# Patient Record
Sex: Male | Born: 1935 | Race: White | Hispanic: No | Marital: Married | Smoking: Never smoker
Health system: Southern US, Community
[De-identification: ages and names within clinical notes are randomized; demographics above are authoritative.]

## PROBLEM LIST (undated history)

## (undated) DIAGNOSIS — R519 Headache, unspecified: Secondary | ICD-10-CM

## (undated) DIAGNOSIS — I2510 Atherosclerotic heart disease of native coronary artery without angina pectoris: Secondary | ICD-10-CM

## (undated) DIAGNOSIS — M1990 Unspecified osteoarthritis, unspecified site: Secondary | ICD-10-CM

## (undated) DIAGNOSIS — R51 Headache: Secondary | ICD-10-CM

## (undated) DIAGNOSIS — N400 Enlarged prostate without lower urinary tract symptoms: Secondary | ICD-10-CM

## (undated) DIAGNOSIS — I10 Essential (primary) hypertension: Secondary | ICD-10-CM

## (undated) DIAGNOSIS — E781 Pure hyperglyceridemia: Secondary | ICD-10-CM

## (undated) DIAGNOSIS — K219 Gastro-esophageal reflux disease without esophagitis: Secondary | ICD-10-CM

## (undated) DIAGNOSIS — J309 Allergic rhinitis, unspecified: Secondary | ICD-10-CM

## (undated) DIAGNOSIS — M549 Dorsalgia, unspecified: Secondary | ICD-10-CM

## (undated) DIAGNOSIS — R4189 Other symptoms and signs involving cognitive functions and awareness: Secondary | ICD-10-CM

## (undated) DIAGNOSIS — I214 Non-ST elevation (NSTEMI) myocardial infarction: Secondary | ICD-10-CM

## (undated) DIAGNOSIS — R42 Dizziness and giddiness: Secondary | ICD-10-CM

## (undated) HISTORY — DX: Non-ST elevation (NSTEMI) myocardial infarction: I21.4

## (undated) HISTORY — DX: Atherosclerotic heart disease of native coronary artery without angina pectoris: I25.10

## (undated) HISTORY — DX: Unspecified osteoarthritis, unspecified site: M19.90

## (undated) HISTORY — DX: Benign prostatic hyperplasia without lower urinary tract symptoms: N40.0

## (undated) HISTORY — DX: Essential (primary) hypertension: I10

## (undated) HISTORY — PX: CARDIAC CATHETERIZATION: SHX172

## (undated) HISTORY — DX: Allergic rhinitis, unspecified: J30.9

## (undated) HISTORY — DX: Pure hyperglyceridemia: E78.1

## (undated) HISTORY — DX: Gastro-esophageal reflux disease without esophagitis: K21.9

---

## 1997-12-10 ENCOUNTER — Ambulatory Visit (HOSPITAL_COMMUNITY): Admission: RE | Admit: 1997-12-10 | Discharge: 1997-12-10 | Payer: Self-pay | Admitting: Gastroenterology

## 2000-09-10 ENCOUNTER — Encounter: Admission: RE | Admit: 2000-09-10 | Discharge: 2000-09-10 | Payer: Self-pay | Admitting: Family Medicine

## 2000-09-10 ENCOUNTER — Encounter: Payer: Self-pay | Admitting: Family Medicine

## 2002-02-19 ENCOUNTER — Ambulatory Visit (HOSPITAL_COMMUNITY): Admission: RE | Admit: 2002-02-19 | Discharge: 2002-02-19 | Payer: Self-pay | Admitting: Gastroenterology

## 2005-03-10 ENCOUNTER — Inpatient Hospital Stay (HOSPITAL_COMMUNITY): Admission: EM | Admit: 2005-03-10 | Discharge: 2005-03-13 | Payer: Self-pay | Admitting: Emergency Medicine

## 2005-03-29 ENCOUNTER — Encounter (HOSPITAL_COMMUNITY): Admission: RE | Admit: 2005-03-29 | Discharge: 2005-06-27 | Payer: Self-pay | Admitting: Cardiology

## 2006-10-21 ENCOUNTER — Encounter: Admission: RE | Admit: 2006-10-21 | Discharge: 2006-10-21 | Payer: Self-pay | Admitting: Sports Medicine

## 2006-10-22 ENCOUNTER — Emergency Department (HOSPITAL_COMMUNITY): Admission: EM | Admit: 2006-10-22 | Discharge: 2006-10-22 | Payer: Self-pay | Admitting: Emergency Medicine

## 2006-10-31 ENCOUNTER — Encounter: Admission: RE | Admit: 2006-10-31 | Discharge: 2006-10-31 | Payer: Self-pay | Admitting: Orthopaedic Surgery

## 2006-11-05 ENCOUNTER — Encounter: Admission: RE | Admit: 2006-11-05 | Discharge: 2006-11-05 | Payer: Self-pay | Admitting: Orthopaedic Surgery

## 2006-11-09 ENCOUNTER — Inpatient Hospital Stay (HOSPITAL_COMMUNITY): Admission: EM | Admit: 2006-11-09 | Discharge: 2006-11-21 | Payer: Self-pay | Admitting: Emergency Medicine

## 2006-11-09 ENCOUNTER — Ambulatory Visit: Payer: Self-pay | Admitting: Internal Medicine

## 2010-07-18 NOTE — Discharge Summary (Signed)
Shaun Moore, Shaun Moore                    ACCOUNT NO.:  [id number]   MEDICAL RECORD NO.:  [id number]          PATIENT TYPE:  INP   LOCATION:  1530                         FACILITY:  [facility]   PHYSICIAN:  Michelene Gardener, MD    DATE OF BIRTH:  May 20, 1925   DATE OF ADMISSION:  11/09/2006  DATE OF DISCHARGE:  11/21/2006                               DISCHARGE SUMMARY   PRIMARY CARE PHYSICIAN:  Dr. Donia Guiles.   DISCHARGE DIAGNOSES:  1. Intractable back pain status post vertebroplasty.  2. Sepsis secondary to pneumonia.  3. Aspiration pneumonia.  4. Coronary artery disease.  5. Renal insufficiency.  6. Hypertension.  7. Hyperlipidemia.   DISCHARGE MEDICATIONS:  1. Benicar 20 mg p.o. once daily.  2. Dulcolax 5 mg twice daily as needed.  3. Flomax 0.4 mg once daily.  4. Multivitamin one tablet p.o. once daily.  5. Niaspan 500 mg at bedtime.  6. Plavix 75 mg p.o. once daily.  7. Vytorin 10/10 once daily.  8. Norvasc 10 mg p.o. once daily.  9. Clonidine patch 0.2 mg every week.   CONSULTATIONS:  Interventional Radiology.   PROCEDURES PERFORMED:  Vertebroplasty.   FOLLOWUP APPOINTMENTS:  Primary physician, Dr. Donia Guiles in 1-2  weeks.   RADIOLOGY STUDIES:  1. Chest x-ray on November 11, 2006, showed prominence in the right      hilum with left base atelectasis and moderate gastric distention.  2. Renal ultrasound on September 8 showed normal-sized kidneys with a      small left renal cyst.  3. Abdominal x-ray on September 8 showed nasogastric tube.  4. Repeat chest x-ray done on September 8 showed a PICC line in place.  5. Chest X-Ray on September 9 showed some improvement in the bibasilar      atelectasis.  6. Abdominal x-ray on September 10 showed unremarkable findings.  7. X-ray on September 11 showed continued improvement in the      densities.  8. Vertebroplasty done on September 15.   HOSPITAL COURSE:  1. Intractable back pain.  This patient was admitted to the  hospital      for further evaluation.  The patient was started on pain treatment      and pain medications.  During the hospitalization, Interventional      Radiology was consulted and on September 15 the patient had      vertebroplasty.  Currently, the patient doing very fine.  Physical      therapy started and he tolerated very well.  Will give him Vicodin      to take as needed.  Recommended to follow with his primary      physician after discharge.  2. Sepsis.  The patient developed a fever and was started on      antibiotics during the hospitalization.  He completed a course of      antibiotics in the hospital.  Currently, he is stable.  White count      is normal.  He is afebrile.  No need for more antibiotics.  3. Aspiration pneumonia.  The patient thought to have aspiration in      the beginning.  He was monitored in ICU for some time.  The patient      was started on IV antibiotics during the hospitalization.  NG tube      was placed for aspiration precautions.  A swallow study was done      and the patient did fine.  He completed more than 7 days of IV      antibiotics.  No need for further antibiotics at this time.  The      last chest x-ray showed very minimal changes in the x-ray.  4. Hypertension.  His blood pressure remains very uncontrolled.  He      had clonidine patch added to his medications in addition to      Norvasc.  Then his  Benicar was continued.  With those      measurements, his systolic blood pressure remained between 130-140.      He was advised to follow with his primary physician as soon as      possible because of the multiple antihypertensive medications that      he is taking now.  5. Coronary artery disease.  That remained stable during his      hospitalization.   DISPOSITION:  Otherwise, other medical conditions remained stable and  the patient was continued on same other medications taken at home.   Total assessment time is 40 minutes.       Michelene Gardener, MD  Electronically Signed     NAE/MEDQ  D:  11/21/2006  T:  11/21/2006  Job:  [id number]   cc:   Donia Guiles, M.D.  Fax: [phone]

## 2010-07-18 NOTE — H&P (Signed)
Shaun Moore, Shaun Moore                    ACCOUNT NO.:  [id number]   MEDICAL RECORD NO.:  [id number]          PATIENT TYPE:  INP   LOCATION:  0104                         FACILITY:  [facility]   PHYSICIAN:  Shaun Mole. Swords, MD    DATE OF BIRTH:  04-07-1925   DATE OF ADMISSION:  11/09/2006  DATE OF DISCHARGE:                              HISTORY & PHYSICAL   CHIEF COMPLAINT:  Back pain.   HISTORY OF PRESENT ILLNESS:  Shaun Moore is a 77 year old gentleman who has  had 5-6 weeks of progressive back discomfort.  This has been thoroughly  evaluated as an outpatient by his medical physician, spine specialist  and interventional radiculopathy.  Etiology of the discomfort is thought  to be related to an L3 endplate fracture or Schmorl's node.  The patient  describes constant lower back pain, associated with intermittent sharp,  severe (10 out of 10) brief episodes of discomfort. This discomfort  seems to radiate to the right hip.  The pain is progressively worsened.  He denies any weakness, fevers, chills, sensory loss.  He has not had  any bowel of bladder trouble.  He does report that 2 months prior to the  beginning of his discomfort he fell on his back; but he had no pain at  that time.  He has had a recent MRI, whole body scan and abdominal CT.  Those studies are reviewed.  No significant abdominal process on the CT  scan.  MRI demonstrated a subacute Schmorl's node in the superior  endplate of L3, with surrounding marrow edema.  Nuclear medicine whole  body scan demonstrated abnormal increased activity within the L3  vertebral body, compatible with a subacute Schmorl's node or focal  endplate fracture.   PAST MEDICAL HISTORY:  1. Myocardial infarction.  2. Coronary artery disease.  3. Dyslipidemia.  4. Hypertension.   PAST SURGICAL HISTORY:  He has had 2 drug eluting stents.   MEDICATIONS:  Benicar, Vytorin, Plavix, Niaspan, Vicodin, Flomax,  Dulcolax.  Dosages of these medications are not  known.   ALLERGIES:  NO KNOWN DRUG ALLERGIES.   SOCIAL HISTORY:  Lives with his wife.  He is a nonsmoker.  Does not  drink alcohol.   FAMILY HISTORY:  Significant for coronary artery disease in his father.   REVIEW OF SYSTEMS:  Completely unremarkable, except for that within the  HPI.   PHYSICAL EXAMINATION:  Temperature 97.4, blood pressure 124/67, pulse  72, respirations 20.  GENERAL:  Appears as a well developed, well nourished male in no acute  distress.  He appears younger than his stated age.  NECK:  Supple without lymphadenopathy, thyromegaly, jugular venous  distention or carotid bruits.  HEENT:  Pupils are round.  CHEST:  Clear to auscultation.  CARDIAC:  S1, S2 are regular without murmurs or gallops.  ABDOMEN:  Active bowel sounds; soft and nontender.  There is no  hepatosplenomegaly.  MUSCULOSKELETAL:  There is no palpable discomfort over the hip or low  back.  He has full internal and external rotation of both hips.  Straight leg raise  is positive bilaterally at 30 degrees.  NEUROLOGIC:  Deep tendon reflexes at the knee are normal; at the ankle  are absent.  Toes are downgoing bilaterally with Babinski maneuver.  EXTREMITIES:  There is no clubbing, cyanosis or edema.   LABORATORY STUDIES:  Urinalysis is normal.  CMET:  Glucose 174, BUN 47,  creatinine 1.94, total bilirubin 1.3.  CBC:  White count 11.3,  hemoglobin 11.7.   IMAGING STUDIES:  Nuclear Medicine Scan:  As above.  Diagnostic Abdomen  and Chest from October 22, 2006 was reviewed; there was no significant  abnormality on that chest x-ray and abdominal film.  MRI scan reviewed  from October 21, 2006.   ASSESSMENT AND PLAN:  1. INTRACTABLE BACK PAIN.  Likely secondary to endplate fracture at      L3.  The patient has undergone steroid injection at the right L3      nerve root.  He has really had no significant relief from the      discomfort.  I suspect this has been unsuccessful attempt.  He will      need  further intervention.  There is mention on the procedure      report that consideration may be given to a vertebroplasty.  I      think that is the direction we should pursue.  I have talked with      radiology; they have asked me to write an order in the hospital      chart for a vertebroplasty.  They will consult.  I doubt that his      discomfort or imaging abnormalities are related to infectious or      malignancy.  I will obtain blood cultures and a sed rate.  It is      worth noting that his white count is minimally elevated and he is      mildly anemic.  2. CORONARY ARTERY DISEASE.  Stable.  3. HYPERGLYCEMIA.  Will monitor tomorrow with a BMET.  Will also check      a hemoglobin A1c.  4. RENAL INSUFFICIENCY.  It is unclear what his baseline renal status      is.  We will check a BMET tomorrow.  Will hydrate tonight.      Shaun Rexene Edison Swords, MD  Electronically Signed     BHS/MEDQ  D:  11/09/2006  T:  11/09/2006  Job:  [id number]   cc:   Shaun Moore, M.D.  Fax: [phone]   Shaun Moore, M.D.  Fax: [phone]

## 2010-07-21 NOTE — Op Note (Signed)
   NAME:  Shaun Moore, Shaun Moore                              ACCOUNT NO.:  [id number]   MEDICAL RECORD NO.:  [id number]                   PATIENT TYPE:  AMB   LOCATION:  ENDO                                 FACILITY:  [facility]   PHYSICIAN:  James L. Malon Kindle., M.D.          DATE OF BIRTH:  1925/09/29   DATE OF PROCEDURE:  02/19/2002  DATE OF DISCHARGE:                                 OPERATIVE REPORT   PROCEDURE:  Colonoscopy.   MEDICATIONS:  Fentanyl 100 mcg, Versed 10 mg IV.   INDICATIONS:  Heme positive stool.   DESCRIPTION OF PROCEDURE:  The procedure had been explained to the patient  and consent was obtained.  With the patient in the left lateral decubitus  position.  The scope was inserted and advanced under direct visualization.  The prep was quite good.  We were able to advance finally to the cecum.  We  started with the adult scope and because of patient's sensitive severe  diverticular disease and we were unable to pass the sigmoid colon with the  adult scope and switched over to the adjustable pediatric and eventually  were able to pass the sigmoid with that.  After passing the sigmoid, we were  able to advance rapidly to the cecum.  The ileocecal valve, and appendiceal  orifice seen.  The scope was withdrawn to cecum, ascending colon, hepatic  flexure, transverse colon, splenic flexure, descending and sigmoid colon  were seen well and no polyps seen.  Again, extensive diverticular disease of  the left colon.  Internal hemorrhoids seen in the rectum.  The scope was  withdrawn and the patient tolerated the procedure well.   ASSESSMENT:  1. Marked diverticular disease.  2. Heme positive stools.   PLAN:  Will see back in the office in two months.  Give an information sheet  about diverticular disease.                                               James L. Malon Kindle., M.D.    [name]  D:  02/19/2002  T:  02/20/2002  Job:  [id number]   cc:   Donia Guiles, M.D.  [address] [place]  [place] [address]  Fax: [phone]

## 2010-07-21 NOTE — H&P (Signed)
NAMEJOSHVA, LABRECK                    ACCOUNT NO.:  [id number]   MEDICAL RECORD NO.:  [id number]          PATIENT TYPE:  OBV   LOCATION:  2010                         FACILITY:  [facility]   PHYSICIAN:  Ulyses Amor, MD DATE OF BIRTH:  [birth year]   DATE OF ADMISSION:  03/08/2005  DATE OF DISCHARGE:                                HISTORY & PHYSICAL   IDENTIFYING DATA AND CHIEF COMPLAINT:  Shaun Moore is a 77 year old white man  who is admitted to [facility] for what is suspected to be unstable  angina, although the possibility of an acute non-ST segment elevation  myocardial infarction cannot be excluded.   HISTORY OF ILLNESS:  The patient, who has no past history of cardiac disease  presents to the emergency department with a two-day history of exertional  bilateral forearm pain and ultimately this evening pain at rest.  During the  last few days the patient has noticed while waking bilateral forearm pain.  This has been associated with a discomfort in his throat.  The discomfort  would begin with activity and has resolved with rest.  He has experienced  this perhaps four to five times over these last few days; each episode has  lasted 30 minutes.   Today, however, he experienced an episode of bilateral forearm pain at rest.  It is more intense than usual and it is associated with profuse diaphoresis.  It eventually resolved after approximately 30 minutes, but he felt weak for  a period of time thereafter.  He is symptomatic and otherwise feels well at  this time.  There are no other exacerbating or ameliorating factors.  There  is no other radiation of this discomfort.  The quality of the bilateral  forearm discomfort is described as an ache.  There is no chest pain,  tightness, heaviness, pressure, or squeezing.  Nor is there any dyspnea or  nausea.   As noted the patient has past no history of cardiac disease including no  history of chest pain, myocardial infarction,  coronary artery disease,  congestive heart failure, or arrhythmias.  He has a number of risk factors  for coronary disease including hypertension dyslipidemia, and a family of  early coronary artery disease in his father in his 88s.  There is no history  of diabetes mellitus or smoking.   PAST MEDICAL HISTORY:  The patient has no other medical problems.   LIFESTYLE:  The patient lives a vigorous lifestyle.  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   MEDICATIONS:  Benicar and Vytorin   ALLERGIES:  The patient has no allergies.   PAST SURGICAL HISTORY:  Operations - none.   ACCIDENTS AND INJURIES:  There is no history of significant injures.   SOCIAL HISTORY:  The patient lives with his wife.  Her works as described  above.  He does no smoke cigarettes or drink alcohol.   FAMILY HISTORY:  Coronary artery disease as noted above in is father.  His  mother died healthy  at an old age.   REVIEW OF SYSTEMS:  Review of systems reveals no new problems related to his  head, eyes, ears, nose, mouth, throat, lungs, gastrointestinal system,  genitourinary system, or extremities.  There is no history of neurologic or  psychiatric disorder.  There is no history of fever, chills or weight loss.   PHYSICAL EXAMINATION:  VITAL SIGNS:  Blood pressure 118/69, pulse 76 and  regular, respirations 18 and temperature 97.8.  GENERAL APPEARANCE:  The patient is an elderly white man in no discomfort.  He is alert, oriented, appropriate and responsive.  HEENT:  Head, eyes, nose and mouth are normal.  NECK:  The neck is without thyromegaly or adenopathy.  Carotid pulses are  palpable bilaterally and without bruits.  HEART:  Cardiac examination reveals a normal S1 and S2.  There is no S3, S4,  murmur, rub, or click.  Cardiac rhythm is regular.  CHEST:  No chest wall tenderness is noted.  LUNGS:  The lungs are clear.  ABDOMEN:  The abdomen is soft and  nontender.  There is no mass,  hepatosplenomegaly, bruit, distention, rebound, guarding, or rigidity.  Bowel sounds are normal.  RECTAL AND GENITALIA:  Rectal and genital examinations are not performed as  they are not pertinent to the reason for acute care hospitalization.  EXTREMITIES:  The extremities are without edema, deviation or deformity.  VASCULAR:  Radial and dorsalis pedal pulses are palpable bilaterally.  NEUROLOGIC EXAMINATION:  Brief screening neurologic survey is unremarkable.   LABORATORY DATA:  The electrocardiogram revealed normal sinus rhythm with  right bundle branch block.  The possibility of a prior inferior myocardial  infarction cannot be excluded.  There were no changes, specifically ischemia  or infarction.  The chest radiograph demonstrated no evidence of acute  disease.  The initial set of cardiac markers revealed a myoglobin of 176,  CK/MB 4.2 and troponin 0.11.  A second set of cardiac markers revealed  myoglobin of 118, CK/MB 3.2 and troponin 0.08.  Potassium 4.4, BUN 23 and  creatinine 1.3.  White count is 7.5 with a hemoglobin of 14,1 and a  hematocrit of 40.3.  The remaining studies are pending at the time of this  dictation.   IMPRESSION:  1.  Unstable angina, rule out non-ST segment elevation acute myocardial      infarction.  2.  Hypertension.  3.  Dyslipidemia.   PLAN:  1.  Telemetry.  2 .  Serial cardiac enzymes.  1.  Aspirin or intravenous nitroglycerin.  2.  Intravenous heparin.  3.  Metoprolol.  4.  Fasting lipid profile.  5.  Further measures per Dr. Amil Amen.      Ulyses Amor, MD  Electronically Signed     MSC/MEDQ  D:  03/09/2005  T:  03/09/2005  Job:  [id number]

## 2010-07-21 NOTE — Discharge Summary (Signed)
Shaun Moore, Shaun Moore                    ACCOUNT NO.:  [id number]   MEDICAL RECORD NO.:  [id number]          PATIENT TYPE:  INP   LOCATION:  6524                         FACILITY:  [facility]   PHYSICIAN:  Francisca December, M.D.  DATE OF BIRTH:  05/24/1925   DATE OF ADMISSION:  03/08/2005  DATE OF DISCHARGE:  03/13/2005                                 DISCHARGE SUMMARY   ADMISSION DIAGNOSES:  1.  Acute coronary syndrome/non-ST-segment-elevation myocardial infarction.  2.  Hypertension.  3.  Dyslipidemia.   DISCHARGE DIAGNOSES:  1.  Atherosclerotic cardiovascular disease, two-vessel.  2.  Non-ST-segment-elevation myocardial infarction, inferior.  3.  Hypertension.  4.  Dyslipidemia.   HISTORY AND PHYSICAL:  Please see the summary by Dr. Lemmie Evens dated  March 10, 2005.   ADMISSION LABORATORY WORK:  Initial CBC was normal, hemoglobin 14.1;  platelets 310,000; white blood cell count 7500.  Serum electrolytes were  normal, BUN was 1.3, creatinine 1.5, blood sugar minimally elevated at 1.0.  Liver associated enzymes were normal with the exception of a total bilirubin  of 1.4.  Initial CK-MB was negative, troponin was 0.16 with a followup of  0.15.  His admission electrocardiogram was nonacute.  His total cholesterol  was 153 with a triglyceride of 183, an HDL of 32 and an LDL of 84.   MEDICATIONS ON ADMISSION:  Included Benicar and Vytorin.   COURSE IN THE HOSPITAL:  The patient was admitted by Dr. Waldon Reining, received  serial cardiac enzymes, IV nitroglycerin, IV heparin, and metoprolol, as  well as aspirin.  I saw him on March 09, 2005, and recommended cardiac  catheterization.  This was carried out and there was a finding of an  eccentric 80-90% anterior descending artery stenosis, as well as a 95%  proximal RCA stenosis.  The right coronary artery was treated with a drug-  eluting stent with complete resolution of the obstruction.  Integrilin was  continued following the  catheterization.  He was seen by Dr. Mayford Knife on  March 10, 2005, found to be doing quite well.  He was observed over the  weekend, had Plavix initiated, and then was returned to the catheterization  laboratory on March 12, 2005, where his 90% mid anterior descending artery  was treated again with a drug-eluting stent.  This was 3.0/20 mm Cypher  intercoronary stent.  Again, the 90% obstruction was reduced to 0%  obstruction.  He had no complications following these procedures, the right  groin healed nicely.  He was discharged on Vytorin, aspirin, Plavix, and  continued on his Benicar.  Overall LV systolic function was noted to be  normal at the time of his initial catheterization on March 09, 2005.   DISCHARGE MEDICATIONS:  1.  Vytorin 10/40 one p.o. daily.  2.  Aspirin 325 mg p.o. daily.  3.  Plavix 75 mg p.o. daily.  4.  Benicar HCT 40/12.5 one-half p.o. daily.  5.  P.r.n. nitroglycerin.   FOLLOWUP:  The patient has been given his activity prescription per cardiac  rehab and I will see him in the  office in 10-14 days.  He should promptly  call or return to the emergency room for any recurrent chest discomfort.      Francisca December, M.D.  Electronically Signed     JHE/MEDQ  D:  07/14/2005  T:  07/16/2005  Job:  [id number]

## 2010-07-21 NOTE — Cardiovascular Report (Signed)
Shaun Moore, Shaun Moore                    ACCOUNT NO.:  [id number]   MEDICAL RECORD NO.:  [id number]          PATIENT TYPE:  OBV   LOCATION:  2010                         FACILITY:  [facility]   PHYSICIAN:  Francisca December, M.D.  DATE OF BIRTH:  Sep 06, 1925   DATE OF PROCEDURE:  03/09/2005  DATE OF DISCHARGE:                              CARDIAC CATHETERIZATION   PROCEDURES PERFORMED:  1.  Left heart catheterization.  2.  Left ventriculogram.  3.  Coronary angiography.  4.  PCI/drug-eluting stent implantation of proximal right coronary artery.   INDICATIONS:  Shaun Moore is a 77 year old otherwise completely healthy  gentleman who has developed bilateral  proximal arm pain initially with  modest exertion. Yesterday, it occurred at rest and was associated with some  with some mild anterior substernal pain and diaphoresis. He presented to  emergency room where troponins have been minimally elevated. No significant  ECG changes are found. He is brought to catheterization laboratory at this  time to identify the extent of disease and provide further therapeutic  options.   PROCEDURAL NOTE:  The patient is brought to cardiac catheterization  laboratory in fasting state. The right groin was prepped and draped in the  usual sterile fashion. Local anesthesia was obtained with infiltration of 1%  lidocaine. A 6-French catheter sheath was inserted percutaneously into the  right femoral artery utilizing an anterior approach over a guiding J-wire.  Left heart catheterization and coronary angiography then proceeded in a  standard fashion using 6-French #4 left and right Judkins catheters and a 6-  French 110-cm pigtail catheter. All catheter manipulations were performed  using fluoroscopic observation and exchanges performed over a long guiding J-  wire. A left ventriculogram was performed in the 30 degrees RAO angulation  utilizing a power injector. At the completion of procedure, I proceeded with  PCI  of the right coronary. He also noted to have a significant stenosis in  the mid anterior descending artery. The right coronary lesion appeared to be  more likely the culprit lesion, however. A 6-French Cordis 3-D RC guiding  catheter was advanced to the descending aorta where the right coronary os  was engaged. I was unsuccessful engaging the right coronary adequately with  a standard FR guiding catheter and also an AR1. The 0.014 inches Luge  intracoronary guidewire passed across the lesion without difficulty. The  patient had previously been treated with 41,000 units of heparin  intravenously as well as a double bolus and constant infusion of Integrilin.  A right femoral vein sheath was placed to facilitate venous access. The  resulting ACT was 302 seconds. The lesion was initially dilated using a  2.5/15 mm Scimed Maverick intracoronary balloon. This inflated to 6  atmospheres for approximately 1 minute. It was deflated removed. A 3.0 slash  20 mm Scimed Taxus intracoronary drug-eluting stent was then advanced into  place, carefully positioned and deployed at peak pressure of 15 atmospheres  for approximately 45 seconds. The stent balloon was deflated removed and a  3.25/12 mm Scimed Maverick intracoronary balloon was advanced in  place in  the more proximal portion of the stent inflated 18 atmospheres for 30  seconds. It was deflated and advanced slightly and reinflated again to 18  atmospheres for approximately 30 seconds. This balloon was then deflated  removed. Following confirmation of adequate patency in orthogonal views both  with and without the guidewire in place, the guiding catheter was removed.  Hemostasis was achieved by suturing the sheath in place. The patient is  transported to recovery area in stable condition.   HEMODYNAMICS:  Systemic arterial pressure was 104/60 with a mean of 79 mmHg.  There was no systolic gradient across the aortic valve. Left ventricular end-   diastolic pressure was 11 mmHg pre-ventriculogram.   Angiography:  The left ventriculogram demonstrated normal chamber size and  normal global systolic function. There were no regional wall motion  abnormalities. A visual estimate the ejection fraction is 65-70%. The aortic  valve is trileaflet and opens normally. There is no significant mitral  regurgitation. There is no significant coronary calcification.   There was a right dominant coronary artery system present. The main left  coronary artery was normal.   The left anterior descending artery was highly diseased; the vessel contains  an 80-90% diffuse stenosis in the midportion and flow in the anterior  descending is somewhat sluggish. There are two moderate-sized diagonal  lesions which arise prior to the significant stenosis in the midportion. The  ongoing anterior descending is large, transapical and without significant  obstruction.   The left circumflex artery amounts to a single large marginal branch which  trifurcates on the lateral wall of the heart from the mid to the distal  portion. There are some luminal irregularities in this vessel but no  significant obstructions.   The right coronary and its branches are highly diseased; the vessel contains  a proximal eccentric 95% stenosis which extends over about 15 mm. The more  distal portion of this is where the subtotal stenosis is located. The  ongoing right coronary shows luminal irregularities but again no significant  obstructions are seen. The distal portion bifurcates into a small posterior  descending artery and then a large posterolateral segment. The  posterolateral segment gives rise to three small left ventricular branches.   Collateral vessels are not seen.   Following balloon dilatation and stent implantation in the proximal right  coronary, there is no residual stenosis and there is a good step-up and step-down in the stented segment.   FINAL IMPRESSION:   1.  Atherosclerotic cardiovascular disease, two-vessel.  2.  Status post successful PCI and drug-eluting stent implantation, proximal      right coronary.  3.  Intact left ventricular size and global systolic function.   PLAN:  The patient will be returned to the catheterization laboratory in 2-3  days for treatment of the anterior descending artery.      Francisca December, M.D.  Electronically Signed     JHE/MEDQ  D:  03/09/2005  T:  03/09/2005  Job:  [id number]   cc:   Donia Guiles, M.D.  Fax: [phone]

## 2010-12-14 LAB — BASIC METABOLIC PANEL
BUN: 12
CO2: 29
Calcium: 8.9
Chloride: 107
Creatinine, Ser: 1.29
GFR calc Af Amer: >60
GFR calc non Af Amer: 54 — ABNORMAL LOW
Glucose, Bld: 95
Potassium: 4.1
Sodium: 142

## 2010-12-14 LAB — CBC
HCT: 29.4 — ABNORMAL LOW
Hemoglobin: 10.3 — ABNORMAL LOW
MCHC: 35
MCV: 93.7
Platelets: 215
RBC: 3.14 — ABNORMAL LOW
RDW: 13
WBC: 9.9

## 2010-12-14 LAB — PROTIME-INR
INR: 1.1
Prothrombin Time: 14.7

## 2010-12-14 LAB — APTT: aPTT: 29

## 2010-12-15 LAB — BASIC METABOLIC PANEL
BUN: 41 — ABNORMAL HIGH
CO2: 22
CO2: 24
CO2: 26
Calcium: 8.4
Calcium: 8.7
Chloride: 104
Chloride: 107
Chloride: 111
Creatinine, Ser: 1.62 — ABNORMAL HIGH
GFR calc Af Amer: 30 — ABNORMAL LOW
GFR calc Af Amer: 33 — ABNORMAL LOW
GFR calc Af Amer: 50 — ABNORMAL LOW
GFR calc Af Amer: 60 — ABNORMAL LOW
GFR calc non Af Amer: 24 — ABNORMAL LOW
GFR calc non Af Amer: 41 — ABNORMAL LOW
Glucose, Bld: 146 — ABNORMAL HIGH
Glucose, Bld: 189 — ABNORMAL HIGH
Potassium: 4.4
Potassium: 4.7
Potassium: 4.8
Sodium: 136
Sodium: 141
Sodium: 142
Sodium: 142

## 2010-12-15 LAB — COMPREHENSIVE METABOLIC PANEL
ALT: 30
AST: 41 — ABNORMAL HIGH
Albumin: 2.3 — ABNORMAL LOW
Albumin: 3 — ABNORMAL LOW
Alkaline Phosphatase: 75
Alkaline Phosphatase: 89
BUN: 47 — ABNORMAL HIGH
BUN: 51 — ABNORMAL HIGH
CO2: 25
Calcium: 9.5
Chloride: 109
Creatinine, Ser: 1.87 — ABNORMAL HIGH
Creatinine, Ser: 1.94 — ABNORMAL HIGH
GFR calc Af Amer: 34 — ABNORMAL LOW
GFR calc Af Amer: 42 — ABNORMAL LOW
Glucose, Bld: 174 — ABNORMAL HIGH
Glucose, Bld: 193 — ABNORMAL HIGH
Potassium: 4.2
Potassium: 4.3
Potassium: 4.8
Sodium: 139
Total Bilirubin: 1.3 — ABNORMAL HIGH
Total Protein: 5.1 — ABNORMAL LOW
Total Protein: 6.9

## 2010-12-15 LAB — DIFFERENTIAL
Basophils Absolute: 0
Basophils Absolute: 0
Basophils Absolute: 0
Basophils Absolute: 0
Basophils Absolute: 0
Basophils Relative: 0
Basophils Relative: 0
Basophils Relative: 0
Basophils Relative: 0
Basophils Relative: 0
Eosinophils Absolute: 0.2
Eosinophils Absolute: 0.2
Eosinophils Absolute: 0.2
Eosinophils Absolute: 0.2
Eosinophils Relative: 2
Eosinophils Relative: 2
Eosinophils Relative: 2
Eosinophils Relative: 2
Lymphocytes Relative: 11 — ABNORMAL LOW
Lymphocytes Relative: 12
Lymphocytes Relative: 12
Lymphocytes Relative: 4 — ABNORMAL LOW
Lymphocytes Relative: 8 — ABNORMAL LOW
Lymphocytes Relative: 9 — ABNORMAL LOW
Lymphs Abs: 1.1
Lymphs Abs: 1.2
Lymphs Abs: 1.3
Lymphs Abs: 1.3
Monocytes Absolute: 0.8 — ABNORMAL HIGH
Monocytes Absolute: 0.8 — ABNORMAL HIGH
Monocytes Absolute: 0.8 — ABNORMAL HIGH
Monocytes Absolute: 1 — ABNORMAL HIGH
Monocytes Absolute: 1 — ABNORMAL HIGH
Monocytes Relative: 10
Monocytes Relative: 4
Monocytes Relative: 6
Monocytes Relative: 8
Monocytes Relative: 9
Neutro Abs: 10.7 — ABNORMAL HIGH
Neutro Abs: 20.8 — ABNORMAL HIGH
Neutro Abs: 8 — ABNORMAL HIGH
Neutro Abs: 8.2 — ABNORMAL HIGH
Neutro Abs: 8.6 — ABNORMAL HIGH
Neutro Abs: 9.6 — ABNORMAL HIGH
Neutrophils Relative %: 77
Neutrophils Relative %: 78 — ABNORMAL HIGH
Neutrophils Relative %: 78 — ABNORMAL HIGH
Neutrophils Relative %: 83 — ABNORMAL HIGH
Neutrophils Relative %: 86 — ABNORMAL HIGH

## 2010-12-15 LAB — VITAMIN B12: Vitamin B-12: 611 (ref 211–911)

## 2010-12-15 LAB — URINALYSIS, ROUTINE W REFLEX MICROSCOPIC
Nitrite: NEGATIVE
Specific Gravity, Urine: 1.018
Urobilinogen, UA: 0.2
pH: 6

## 2010-12-15 LAB — CARDIAC PANEL(CRET KIN+CKTOT+MB+TROPI)
CK, MB: 10.3 — ABNORMAL HIGH
CK, MB: 7 — ABNORMAL HIGH
CK, MB: 9.4 — ABNORMAL HIGH
Relative Index: 2.8 — ABNORMAL HIGH
Relative Index: 3.5 — ABNORMAL HIGH
Total CK: 247 — ABNORMAL HIGH
Total CK: 293 — ABNORMAL HIGH
Troponin I: 0.07 — ABNORMAL HIGH

## 2010-12-15 LAB — CBC
HCT: 27.3 — ABNORMAL LOW
HCT: 28.2 — ABNORMAL LOW
HCT: 28.3 — ABNORMAL LOW
HCT: 28.9 — ABNORMAL LOW
HCT: 29.6 — ABNORMAL LOW
HCT: 33.6 — ABNORMAL LOW
HCT: 34.8 — ABNORMAL LOW
Hemoglobin: 10 — ABNORMAL LOW
Hemoglobin: 10 — ABNORMAL LOW
Hemoglobin: 10.2 — ABNORMAL LOW
Hemoglobin: 11.7 — ABNORMAL LOW
Hemoglobin: 11.9 — ABNORMAL LOW
Hemoglobin: 14.3
Hemoglobin: 9.6 — ABNORMAL LOW
Hemoglobin: 9.6 — ABNORMAL LOW
MCHC: 34.4
MCHC: 34.4
MCHC: 34.5
MCHC: 34.7
MCHC: 34.8
MCHC: 35
MCHC: 35.3
MCV: 92.9
MCV: 93.6
MCV: 93.7
MCV: 94
MCV: 94.1
MCV: 94.4
MCV: 94.6
MCV: 94.9
Platelets: 175
Platelets: 196
Platelets: 196
Platelets: 197
Platelets: 199
Platelets: 264
Platelets: 271
RBC: 2.91 — ABNORMAL LOW
RBC: 2.91 — ABNORMAL LOW
RBC: 3.04 — ABNORMAL LOW
RBC: 3.09 — ABNORMAL LOW
RBC: 3.12 — ABNORMAL LOW
RBC: 3.68 — ABNORMAL LOW
RBC: 4.41
RDW: 12.6
RDW: 12.7
RDW: 12.8
RDW: 12.9
RDW: 13.1
RDW: 13.3
RDW: 13.3
RDW: 13.4
WBC: 10.3
WBC: 10.6 — ABNORMAL HIGH
WBC: 11 — ABNORMAL HIGH
WBC: 11.3 — ABNORMAL HIGH
WBC: 11.3 — ABNORMAL HIGH
WBC: 12.8 — ABNORMAL HIGH

## 2010-12-15 LAB — PROTIME-INR
INR: 1.1
Prothrombin Time: 14.5

## 2010-12-15 LAB — CULTURE, BLOOD (ROUTINE X 2)
Culture: NO GROWTH
Culture: NO GROWTH
Culture: NO GROWTH
Culture: NO GROWTH

## 2010-12-15 LAB — BLOOD GAS, ARTERIAL
FIO2: 1
O2 Saturation: 93.9
Patient temperature: 98.6
pH, Arterial: 7.409

## 2010-12-15 LAB — FOLATE: Folate: >20

## 2010-12-15 LAB — CROSSMATCH
ABO/RH(D): A POS
Antibody Screen: NEGATIVE

## 2010-12-15 LAB — APTT: aPTT: 23 — ABNORMAL LOW

## 2010-12-15 LAB — ABO/RH: ABO/RH(D): A POS

## 2010-12-15 LAB — IRON AND TIBC
Iron: 69
Saturation Ratios: 28
TIBC: 249
UIBC: 180

## 2010-12-15 LAB — HEMOGLOBIN A1C
Hgb A1c MFr Bld: 6.4 — ABNORMAL HIGH
Mean Plasma Glucose: 151

## 2010-12-15 LAB — HEMOGLOBIN AND HEMATOCRIT, BLOOD
HCT: 29.6 — ABNORMAL LOW
Hemoglobin: 10.7 — ABNORMAL LOW

## 2010-12-15 LAB — GASTRIC OCCULT BLOOD (1-CARD TO LAB): pH, Gastric: 4

## 2010-12-15 LAB — B-NATRIURETIC PEPTIDE (CONVERTED LAB): Pro B Natriuretic peptide (BNP): <30

## 2010-12-15 LAB — FERRITIN: Ferritin: 351 — ABNORMAL HIGH (ref 22–322)

## 2010-12-15 LAB — SEDIMENTATION RATE: Sed Rate: 18 — ABNORMAL HIGH

## 2012-08-12 ENCOUNTER — Ambulatory Visit: Payer: Self-pay | Admitting: Internal Medicine

## 2012-08-12 VITALS — BP 140/78 | HR 74 | Temp 97.6°F | Resp 20 | Ht 68.0 in | Wt 185.4 lb

## 2012-08-12 DIAGNOSIS — Z0289 Encounter for other administrative examinations: Secondary | ICD-10-CM

## 2012-08-12 NOTE — Progress Notes (Signed)
  Subjective:    Patient ID: Shaun Moore, male    DOB: 12-16-1925, 79 y.o.   MRN: [id number]  HPI    Review of Systems     Objective:   Physical Exam        Assessment & Plan:

## 2012-08-12 NOTE — Patient Instructions (Signed)
DASH Diet  The DASH diet stands for "Dietary Approaches to Stop Hypertension." It is a healthy eating plan that has been shown to reduce high blood pressure (hypertension) in as little as 14 days, while also possibly providing other significant health benefits. These other health benefits include reducing the risk of breast cancer after menopause and reducing the risk of type 2 diabetes, heart disease, colon cancer, and stroke. Health benefits also include weight loss and slowing kidney failure in patients with chronic kidney disease.   DIET GUIDELINES  · Limit salt (sodium). Your diet should contain less than 1500 mg of sodium daily.  · Limit refined or processed carbohydrates. Your diet should include mostly whole grains. Desserts and added sugars should be used sparingly.  · Include small amounts of heart-healthy fats. These types of fats include nuts, oils, and tub margarine. Limit saturated and trans fats. These fats have been shown to be harmful in the body.  CHOOSING FOODS   The following food groups are based on a 2000 calorie diet. See your Registered Dietitian for individual calorie needs.  Grains and Grain Products (6 to 8 servings daily)  · Eat More Often: Whole-wheat bread, brown rice, whole-grain or wheat pasta, quinoa, popcorn without added fat or salt (air popped).  · Eat Less Often: White bread, white pasta, white rice, cornbread.  Vegetables (4 to 5 servings daily)  · Eat More Often: Fresh, frozen, and canned vegetables. Vegetables may be raw, steamed, roasted, or grilled with a minimal amount of fat.  · Eat Less Often/Avoid: Creamed or fried vegetables. Vegetables in a cheese sauce.  Fruit (4 to 5 servings daily)  · Eat More Often: All fresh, canned (in natural juice), or frozen fruits. Dried fruits without added sugar. One hundred percent fruit juice (½ cup [237 mL] daily).  · Eat Less Often: Dried fruits with added sugar. Canned fruit in light or heavy syrup.  Lean Meats, Fish, and Poultry (2  servings or less daily. One serving is 3 to 4 oz [85-114 g]).  · Eat More Often: Ninety percent or leaner ground beef, tenderloin, sirloin. Round cuts of beef, chicken breast, turkey breast. All fish. Grill, bake, or broil your meat. Nothing should be fried.  · Eat Less Often/Avoid: Fatty cuts of meat, turkey, or chicken leg, thigh, or wing. Fried cuts of meat or fish.  Dairy (2 to 3 servings)  · Eat More Often: Low-fat or fat-free milk, low-fat plain or light yogurt, reduced-fat or part-skim cheese.  · Eat Less Often/Avoid: Milk (whole, 2%). Whole milk yogurt. Full-fat cheeses.  Nuts, Seeds, and Legumes (4 to 5 servings per week)  · Eat More Often: All without added salt.  · Eat Less Often/Avoid: Salted nuts and seeds, canned beans with added salt.  Fats and Sweets (limited)  · Eat More Often: Vegetable oils, tub margarines without trans fats, sugar-free gelatin. Mayonnaise and salad dressings.  · Eat Less Often/Avoid: Coconut oils, palm oils, butter, stick margarine, cream, half and half, cookies, candy, pie.  FOR MORE INFORMATION  The Dash Diet Eating Plan: www.dashdiet.org  Document Released: 02/08/2011 Document Revised: 05/14/2011 Document Reviewed: 02/08/2011  ExitCare® Patient Information ©2014 [employer].

## 2012-08-12 NOTE — Progress Notes (Signed)
  Subjective:    Patient ID: Shaun Moore, male    DOB: August 25, 1925, 79 y.o.   MRN: [id number]  HPI Patient is here for DOT physical.  Patient has a history of controlled hypertention and high cholesterol and also wears hearing aids.  Patient denies any alcohol, tobacco, or drug use.  Denies any aches or pains, no fatigue or dizziness.  Feels great, active,    Review of Systems  Constitutional: Negative.   HENT: Positive for hearing loss.   Eyes: Negative.   Respiratory: Negative.   Cardiovascular: Negative.   Gastrointestinal: Negative.   Genitourinary: Negative.   Neurological: Negative.   Psychiatric/Behavioral: Negative.        Objective:   Physical Exam  Vitals reviewed. Constitutional: He is oriented to person, place, and time. He appears well-developed and well-nourished.  HENT:  Right Ear: External ear normal.  Left Ear: External ear normal.  Nose: Nose normal.  Mouth/Throat: Oropharynx is clear and moist.  Eyes: Conjunctivae and EOM are normal. Pupils are equal, round, and reactive to light.  Neck: Normal range of motion. Neck supple.  Cardiovascular: Normal rate, regular rhythm, normal heart sounds and intact distal pulses.   Pulmonary/Chest: Effort normal and breath sounds normal.  Abdominal: Soft. Bowel sounds are normal. There is no tenderness.  Genitourinary: Penis normal.  Musculoskeletal: Normal range of motion.  Lymphadenopathy:    He has no cervical adenopathy.  Neurological: He is alert and oriented to person, place, and time. No cranial nerve deficit. He exhibits normal muscle tone. Coordination normal.  Skin: Rash noted.  Psychiatric: He has a normal mood and affect. His behavior is normal. Judgment and thought content normal.          Assessment & Plan:  1 yr DOT

## 2012-09-20 ENCOUNTER — Encounter: Payer: Self-pay | Admitting: Internal Medicine

## 2013-02-03 ENCOUNTER — Telehealth: Payer: Self-pay | Admitting: Cardiology

## 2013-02-03 NOTE — Telephone Encounter (Signed)
New message    Need samples of vytorin 10/40. (Pt has not been seen in new office)

## 2013-02-12 NOTE — Telephone Encounter (Signed)
Left message with spouse that samples will be at the front office

## 2013-04-20 ENCOUNTER — Ambulatory Visit
Admission: RE | Admit: 2013-04-20 | Discharge: 2013-04-20 | Disposition: A | Discharge status: Home / Self Care | Payer: Medicare Other | Source: Ambulatory Visit | Attending: Physician Assistant | Admitting: Physician Assistant

## 2013-04-20 ENCOUNTER — Other Ambulatory Visit: Payer: Self-pay | Admitting: Physician Assistant

## 2013-04-20 DIAGNOSIS — T1490XA Injury, unspecified, initial encounter: Secondary | ICD-10-CM

## 2013-09-09 ENCOUNTER — Encounter: Payer: Self-pay | Admitting: Cardiology

## 2013-10-02 ENCOUNTER — Encounter: Payer: Self-pay | Admitting: *Deleted

## 2013-10-20 ENCOUNTER — Ambulatory Visit: Payer: Self-pay | Admitting: Cardiology

## 2013-11-02 ENCOUNTER — Encounter: Payer: Self-pay | Admitting: Cardiology

## 2013-11-02 ENCOUNTER — Ambulatory Visit (INDEPENDENT_AMBULATORY_CARE_PROVIDER_SITE_OTHER): Payer: Medicare Other | Admitting: Cardiology

## 2013-11-02 VITALS — BP 118/64 | HR 87 | Ht 69.5 in | Wt 184.0 lb

## 2013-11-02 DIAGNOSIS — R400 Somnolence: Secondary | ICD-10-CM | POA: Insufficient documentation

## 2013-11-02 DIAGNOSIS — E7800 Pure hypercholesterolemia, unspecified: Secondary | ICD-10-CM

## 2013-11-02 DIAGNOSIS — G4710 Hypersomnia, unspecified: Secondary | ICD-10-CM

## 2013-11-02 DIAGNOSIS — I2510 Atherosclerotic heart disease of native coronary artery without angina pectoris: Secondary | ICD-10-CM

## 2013-11-02 DIAGNOSIS — I10 Essential (primary) hypertension: Secondary | ICD-10-CM

## 2013-11-02 NOTE — Patient Instructions (Signed)
The current medical regimen is effective;  continue present plan and medications.  Follow up in 1 year with Dr Skains.  You will receive a letter in the mail 2 months before you are due.  Please call us when you receive this letter to schedule your follow up appointment.  

## 2013-11-02 NOTE — Progress Notes (Signed)
[address] [place], [place]  [address] Phone: [phone] Fax:  [phone]  Date:  11/02/2013   ID:  Shaun Moore, DOB 1925/10/22, MRN [id number]  PCP:  No primary provider on file.   History of Present Illness: Shaun Moore is a 80 y.o. male with coronary artery disease status post non-ST elevation myocardial infarction with DES to both RCA and LAD in 2007 with hyperlipidemia, hypertension here for followup. Stress test on 09/24/05 showed no ischemia.  Overall he has been doing well with no exertional angina. He takes apple cider Vinegar daily for leg cramps. 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 Occasional bruising from this.  His lipids look great, his blood pressure is under excellent control. He walks 30 minutes a day. He is very religious.  Occasionally at rest he will feel the sensation of increased respiratory rate. This is transient. No exertional symptoms.  Used to dig swimming pools.   Asked about sex.     Wt Readings from Last 3 Encounters:  11/02/13 184 lb (83.462 kg)  08/12/12 185 lb 6.4 oz (84.097 kg)     Past Medical History  Diagnosis Date  . Essential hypertension, benign   . Pure hyperglyceridemia   . CAD (coronary artery disease)   . NSTEMI (non-ST elevated myocardial infarction)   . OA (osteoarthritis)   . BPH (benign prostatic hypertrophy)   . Rhinitis, allergic   . Esophageal reflux     Past Surgical History  Procedure Laterality Date  . Cardiac catheterization      DE stent RCA and LAD    Current Outpatient Prescriptions  Medication Sig Dispense Refill  . aspirin 81 MG tablet Take 81 mg by mouth daily.      Marland Kitchen ezetimibe-simvastatin (VYTORIN) 10-40 MG per tablet Take 1 tablet by mouth at bedtime.      . fish oil-omega-3 fatty acids 1000 MG capsule Take 2 g by mouth daily.      Marland Kitchen losartan (COZAAR) 50 MG tablet Take 50 mg by mouth daily.      . tamsulosin (FLOMAX) 0.4 MG CAPS Take 0.4 mg by mouth  daily.       No current facility-administered medications for this visit.    Allergies:    Allergies  Allergen Reactions  . Ambien [Zolpidem Tartrate]     Stated that is "made him crazy"    Social History:  The patient  reports that he has never smoked. He does not have any smokeless tobacco history on file.   Family History  Problem Relation Age of Onset  . CAD Father     ROS:  Please see the history of present illness.   Denies any fevers, chills, orthopnea, PND   All other systems reviewed and negative.   PHYSICAL EXAM: VS:  BP 118/64  Pulse 87  Ht 5' 9.5" (1.765 m)  Wt 184 lb (83.462 kg)  BMI 26.79 kg/m2 Well nourished, well developed, in no acute distress HEENT: normal, [place]/AT, EOMI Neck: no JVD, normal carotid upstroke, no bruit Cardiac:  normal S1, S2; RRR; no murmur Lungs:  clear to auscultation bilaterally, no wheezing, rhonchi or rales Abd: soft, nontender, no hepatomegaly, no bruits Ext: no edema, 2+ distal pulses Skin: warm and dry GU: deferred Neuro: no focal abnormalities noted, AAO x 3  EKG:  11/02/13-sinus rhythm, right bundle branch block, possible old inferior infarct pattern.     ASSESSMENT AND PLAN:  1. Coronary artery disease-status post stent placement as above. 2007. Overall doing well. No anginal symptoms, no shortness of breath. Secondary prevention. 2. Hyperlipidemia-continue with Vytorin. LDL 49. Excellent. Dr. Clelia Croft has been checking blood work. 3. Hypertension-excellent control. No dizziness. If blood pressure remains a low side, could consider decreasing Cozaar to 25 mg. 4. Daytime somnolence-he thinks it is from him getting up every 2 hours to use the bathroom. Discussed the possibility of sleep apnea. 5. One-year followup  Signed, Donato Schultz, MD [facility]  11/02/2013 9:02 AM

## 2014-10-25 ENCOUNTER — Encounter: Payer: Self-pay | Admitting: Neurology

## 2014-10-25 ENCOUNTER — Other Ambulatory Visit: Payer: Medicare Other

## 2014-10-25 ENCOUNTER — Ambulatory Visit (INDEPENDENT_AMBULATORY_CARE_PROVIDER_SITE_OTHER): Payer: Medicare Other | Admitting: Neurology

## 2014-10-25 VITALS — BP 128/66 | HR 74 | Resp 20 | Ht 69.5 in | Wt 190.6 lb

## 2014-10-25 DIAGNOSIS — F0280 Dementia in other diseases classified elsewhere without behavioral disturbance: Secondary | ICD-10-CM | POA: Insufficient documentation

## 2014-10-25 DIAGNOSIS — G3184 Mild cognitive impairment, so stated: Secondary | ICD-10-CM

## 2014-10-25 DIAGNOSIS — G301 Alzheimer's disease with late onset: Secondary | ICD-10-CM

## 2014-10-25 LAB — VITAMIN B12: VITAMIN B 12: 356 pg/mL (ref 211–911)

## 2014-10-25 NOTE — Patient Instructions (Addendum)
1.  First, we will check CT of head and vitamin B12 level [facility]  10/29/14 8:15am  2.  If unremarkable, then would start Aricept.  We will start donepezil (Aricept)  daily for four weeks.  Side effects include nausea, vomiting, diarrhea, vivid dreams, and muscle cramps.  Please call the clinic if you experience any of these symptoms. 3.  Follow up in 9 months.

## 2014-10-25 NOTE — Progress Notes (Signed)
NEUROLOGY CONSULTATION NOTE  DIXON LUCZAK MRN: [id number] DOB: Jul 15, 1925  Referring provider: Dr. Clelia Croft Primary care provider: Dr. Clelia Croft  Reason for consult:  Memory problems.  HISTORY OF PRESENT ILLNESS: Shaun Moore is an 81 year old left-handed man with hypertension, hypothyroidism, CAD, and hypercholesterolemia who presents for memory problems.  History obtained by the patient, his wife, and PCP note.  For about 1.5 years, he has had short-term memory problems, which have gradually progressed.  He frequently repeats himself, mostly stories about the past.  He will often repeat questions, too.  Sometimes, he will walk into a room and forget what he needed.  Now, when he meets a new person, he will quickly forget their name.  He has no problems remembering names or recognizing acquaintances or family.  He has no trouble driving and denies disorientation when driving.  Once in a while, he may forget to take a medication, but usually it is not a problem.  He is able to recall content of what he sees on TV.  He denies trouble operating and performing everyday tasks.  He has not had change in personality.  He does not exhibit hallucinations or delusions.  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  He loved his job and lived to work.  Now, he doesn't do much during the day except sleep and watch TV.   TSH was found to be in the mid 3s, so he was started on levothyroxine 25mg  to treat mild hypothyroidism.  For suspected depression, he was started on Zoloft 25mg  daily, which was subsequently discontinued due to diarrhea.  He has no family history of dementia.  He has no personal history of head trauma or stroke.  He went to school up until the 9th grade.  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  PAST MEDICAL HISTORY: Past Medical History  Diagnosis Date  . Essential hypertension, benign   . Pure hyperglyceridemia   . CAD (coronary artery  disease)   . NSTEMI (non-ST elevated myocardial infarction)   . OA (osteoarthritis)   . BPH (benign prostatic hypertrophy)   . Rhinitis, allergic   . Esophageal reflux     PAST SURGICAL HISTORY: Past Surgical History  Procedure Laterality Date  . Cardiac catheterization      DE stent RCA and LAD    MEDICATIONS: Current Outpatient Prescriptions on File Prior to Visit  Medication Sig Dispense Refill  . aspirin 81 MG tablet Take 81 mg by mouth daily.    Marland Kitchen ezetimibe-simvastatin (VYTORIN) 10-40 MG per tablet Take 1 tablet by mouth at bedtime.    . fish oil-omega-3 fatty acids 1000 MG capsule Take 2 g by mouth daily.    Marland Kitchen losartan (COZAAR) 50 MG tablet Take 50 mg by mouth daily.    . tamsulosin (FLOMAX) 0.4 MG CAPS Take 0.4 mg by mouth daily.     No current facility-administered medications on file prior to visit.    ALLERGIES: Allergies  Allergen Reactions  . Ambien [Zolpidem Tartrate]     Stated that is "made him crazy"    FAMILY HISTORY: Family History  Problem Relation Age of Onset  . CAD Father   . Hypertension Sister   . Cancer Sister     renal cell     SOCIAL HISTORY: Social History   Social History  . Marital Status: Married    Spouse Name: N/A  . Number of Children: N/A  .  Years of Education: N/A   Occupational History  . Not on file.   Social History Main Topics  . Smoking status: Never Smoker   . Smokeless tobacco: Never Used  . Alcohol Use: No  . Drug Use: No  . Sexual Activity:    Partners: Female   Other Topics Concern  . Not on file   Social History Narrative    REVIEW OF SYSTEMS: Constitutional: No fevers, chills, or sweats, no generalized fatigue, change in appetite Eyes: No visual changes, double vision, eye pain Ear, nose and throat: No hearing loss, ear pain, nasal congestion, sore throat Cardiovascular: No chest pain, palpitations Respiratory:  No shortness of breath at rest or with exertion, wheezes GastrointestinaI: No  nausea, vomiting, diarrhea, abdominal pain, fecal incontinence Genitourinary:  No dysuria, urinary retention or frequency Musculoskeletal:  No neck pain, back pain Integumentary: No rash, pruritus, skin lesions Neurological: as above Psychiatric: No depression, insomnia, anxiety Endocrine: No palpitations, fatigue, diaphoresis, mood swings, change in appetite, change in weight, increased thirst Hematologic/Lymphatic:  No anemia, purpura, petechiae. Allergic/Immunologic: no itchy/runny eyes, nasal congestion, recent allergic reactions, rashes  PHYSICAL EXAM: Filed Vitals:   10/25/14 1015  BP: 128/66  Pulse: 74  Resp: 20   General: No acute distress.  Patient appears well-groomed.   Head:  Normocephalic/atraumatic Eyes:  fundi unremarkable, without vessel changes, exudates, hemorrhages or papilledema. Neck: supple, no paraspinal tenderness, full range of motion Back: No paraspinal tenderness Heart: regular rate and rhythm Lungs: Clear to auscultation bilaterally. Vascular: No carotid bruits. Neurological Exam: Mental status: alert and oriented to person, place, and time, delayed recall poor, and remote memory intact, fund of knowledge intact, attention and concentration intact, speech fluent and not dysarthric, language intact. Montreal Cognitive Assessment  10/25/2014  Visuospatial/ Executive (0/5) 3  Naming (0/3) 3  Attention: Read list of digits (0/2) 2  Attention: Read list of letters (0/1) 1  Attention: Serial 7 subtraction starting at 100 (0/3) 2  Language: Repeat phrase (0/2) 1  Language : Fluency (0/1) 0  Abstraction (0/2) 2  Delayed Recall (0/5) 0  Orientation (0/6) 5  Total 19  Adjusted Score (based on education) 20   Cranial nerves: CN I: not tested CN II: pupils equal, round and reactive to light, visual fields intact, fundi unremarkable, without vessel changes, exudates, hemorrhages or papilledema. CN III, IV, VI:  full range of motion, no nystagmus, no  ptosis CN V: facial sensation intact CN VII: upper and lower face symmetric CN VIII: hearing intact CN IX, X: gag intact, uvula midline CN XI: sternocleidomastoid and trapezius muscles intact CN XII: tongue midline Bulk & Tone: normal, no fasciculations. Motor:  5/5 throughout. Sensation:  Pinprick sensation intact.  Decreased vibration sensation in feet. Deep Tendon Reflexes:  1+ throughout except absent in ankles, toes downgoing. Finger to nose testing:  Without dysmetria. Heel to shin:  Without dysmetria. Gait:  Mildly wide based gait with short stride.  Unable to tandem walk.  Romberg negative.  IMPRESSION: Mild cognitive impairment, amnestic.    PLAN: 1.  Will check CT of head and B12 to look for secondary causes of memory problems. 2.  If unremarkable, would initiate Aricept  daily for 4 weeks, then  daily if tolerated. 3.  Follow up in 9 months for re-evaluation.  Thank you for allowing me to take part in the care of this patient.  Shon Millet, DO  CC:  Lupita Raider, MD

## 2014-10-27 ENCOUNTER — Telehealth: Payer: Self-pay | Admitting: *Deleted

## 2014-10-27 NOTE — Telephone Encounter (Signed)
-----   Message from Drema Dallas, DO sent at 10/26/2014  7:16 AM EDT ----- b12 is normal

## 2014-10-27 NOTE — Telephone Encounter (Signed)
Patient is aware of normal labs  

## 2014-10-29 ENCOUNTER — Ambulatory Visit (HOSPITAL_COMMUNITY)
Admission: RE | Admit: 2014-10-29 | Discharge: 2014-10-29 | Disposition: A | Discharge status: Home / Self Care | Payer: Medicare Other | Source: Ambulatory Visit | Attending: Neurology | Admitting: Neurology

## 2014-10-29 DIAGNOSIS — R413 Other amnesia: Secondary | ICD-10-CM | POA: Diagnosis not present

## 2014-10-29 DIAGNOSIS — R531 Weakness: Secondary | ICD-10-CM | POA: Insufficient documentation

## 2014-10-29 DIAGNOSIS — G3184 Mild cognitive impairment, so stated: Secondary | ICD-10-CM | POA: Insufficient documentation

## 2014-11-02 ENCOUNTER — Ambulatory Visit (INDEPENDENT_AMBULATORY_CARE_PROVIDER_SITE_OTHER): Payer: Medicare Other | Admitting: Cardiology

## 2014-11-02 ENCOUNTER — Encounter: Payer: Self-pay | Admitting: Cardiology

## 2014-11-02 VITALS — BP 130/70 | HR 75 | Ht 70.0 in | Wt 191.8 lb

## 2014-11-02 DIAGNOSIS — I2510 Atherosclerotic heart disease of native coronary artery without angina pectoris: Secondary | ICD-10-CM

## 2014-11-02 DIAGNOSIS — E7800 Pure hypercholesterolemia, unspecified: Secondary | ICD-10-CM

## 2014-11-02 DIAGNOSIS — I10 Essential (primary) hypertension: Secondary | ICD-10-CM | POA: Diagnosis not present

## 2014-11-02 NOTE — Patient Instructions (Signed)
Medication Instructions:  Your physician recommends that you continue on your current medications as directed. Please refer to the Current Medication list given to you today.  Follow-Up: Follow up in 1 year with Dr. Skains.  You will receive a letter in the mail 2 months before you are due.  Please call us when you receive this letter to schedule your follow up appointment.  Thank you for choosing [facility]!!       

## 2014-11-02 NOTE — Progress Notes (Signed)
[address], [address] [place], [place]  [address] Phone: [phone] Fax:  [phone]  Date:  11/02/2014   ID:  Shaun Moore, DOB Apr 01, 1925, MRN [id number]  PCP:  Lupita Raider, MD   History of Present Illness: Shaun Moore is a 81 y.o. male with coronary artery disease status post non-ST elevation myocardial infarction with DES to both RCA and LAD in 2007 with hyperlipidemia, hypertension here for followup. Stress test on 09/24/05 showed no ischemia.  Overall he has been doing well with no exertional angina. He takes apple cider Vinegar daily for leg cramps. He had been working on his [employer] changing a generator. Occasional bruising from this.  His lipids look great, his blood pressure is under excellent control. He walks 30 minutes a day. He is very religious.  Occasionally at rest he will feel the sensation of increased respiratory rate. This is transient. No exertional symptoms.  Used to dig swimming pools for a living.    He has experienced memory loss      Wt Readings from Last 3 Encounters:  11/02/14 191 lb 12.8 oz (87 kg)  10/25/14 190 lb 9.6 oz (86.456 kg)  11/02/13 184 lb (83.462 kg)     Past Medical History  Diagnosis Date  . Essential hypertension, benign   . Pure hyperglyceridemia   . CAD (coronary artery disease)   . NSTEMI (non-ST elevated myocardial infarction)   . OA (osteoarthritis)   . BPH (benign prostatic hypertrophy)   . Rhinitis, allergic   . Esophageal reflux     Past Surgical History  Procedure Laterality Date  . Cardiac catheterization      DE stent RCA and LAD    Current Outpatient Prescriptions  Medication Sig Dispense Refill  . aspirin 81 MG tablet Take 81 mg by mouth daily.    Marland Kitchen donepezil (ARICEPT) 5 MG tablet Take 5 mg by mouth daily.  0  . ezetimibe-simvastatin (VYTORIN) 10-40 MG per tablet Take 1 tablet by mouth at bedtime.    . finasteride (PROSCAR) 5 MG tablet Take 5 mg by mouth daily.   2  . fish oil-omega-3  fatty acids 1000 MG capsule Take 2 g by mouth daily.    Marland Kitchen levothyroxine (SYNTHROID, LEVOTHROID) 25 MCG tablet Take 25 mcg by mouth daily before breakfast.   0  . losartan (COZAAR) 50 MG tablet Take 50 mg by mouth daily.    . tamsulosin (FLOMAX) 0.4 MG CAPS Take 0.4 mg by mouth daily.     No current facility-administered medications for this visit.    Allergies:    Allergies  Allergen Reactions  . Ambien [Zolpidem Tartrate]     Stated that is "made him crazy"    Social History:  The patient  reports that he has never smoked. He has never used smokeless tobacco. He reports that he does not drink alcohol or use illicit drugs.   Family History  Problem Relation Age of Onset  . CAD Father   . Hypertension Sister   . Cancer Sister     renal cell     ROS:  Please see the history of present illness.   Denies any fevers, chills, orthopnea, PND   All other systems reviewed and negative.   PHYSICAL EXAM: VS:  BP 130/70 mmHg  Pulse 75  Ht 5\' 10"  (1.778 m)  Wt 191 lb 12.8 oz (87 kg)  BMI 27.52 kg/m2 Well nourished, well developed, in no acute distress  HEENT: normal, [place]/AT, EOMI Neck: no JVD, normal carotid upstroke, no bruit Cardiac:  normal S1, S2; RRR; no murmur Lungs:  clear to auscultation bilaterally, no wheezing, rhonchi or rales Abd: soft, nontender, no hepatomegaly, no bruits Ext: no edema, 2+ distal pulses Skin: warm and dry GU: deferred Neuro: no focal abnormalities noted, AAO x 3  EKG:   Today 11/02/14-sinus rhythm, right bundle branch block, possible old inferior infarct, personally viewed-no change from prior-11/02/13-sinus rhythm, right bundle branch block, possible old inferior infarct pattern.     ASSESSMENT AND PLAN:  1. Coronary artery disease-status post stent placement as above. 2007. Overall doing well. No anginal symptoms, no shortness of breath. Secondary prevention. 2. Hyperlipidemia-continue with Vytorin. LDL 49. Excellent. Dr. Clelia Croft has been checking blood  work. 3. Hypertension-excellent control. No dizziness. Tolerating medications well. 4. Memory loss-donepezil. 5. One-year followup  Signed, Donato Schultz, MD [facility]  11/02/2014 9:35 AM

## 2014-11-22 ENCOUNTER — Other Ambulatory Visit: Payer: Self-pay | Admitting: Neurology

## 2015-07-07 DIAGNOSIS — F322 Major depressive disorder, single episode, severe without psychotic features: Secondary | ICD-10-CM | POA: Diagnosis not present

## 2015-07-07 DIAGNOSIS — I25119 Atherosclerotic heart disease of native coronary artery with unspecified angina pectoris: Secondary | ICD-10-CM | POA: Diagnosis not present

## 2015-07-07 DIAGNOSIS — R413 Other amnesia: Secondary | ICD-10-CM | POA: Diagnosis not present

## 2015-07-07 DIAGNOSIS — N183 Chronic kidney disease, stage 3 (moderate): Secondary | ICD-10-CM | POA: Diagnosis not present

## 2015-07-07 DIAGNOSIS — E782 Mixed hyperlipidemia: Secondary | ICD-10-CM | POA: Diagnosis not present

## 2015-07-07 DIAGNOSIS — E039 Hypothyroidism, unspecified: Secondary | ICD-10-CM | POA: Diagnosis not present

## 2015-07-07 DIAGNOSIS — I1310 Hypertensive heart and chronic kidney disease without heart failure, with stage 1 through stage 4 chronic kidney disease, or unspecified chronic kidney disease: Secondary | ICD-10-CM | POA: Diagnosis not present

## 2015-07-27 ENCOUNTER — Encounter: Payer: Self-pay | Admitting: Neurology

## 2015-07-27 ENCOUNTER — Ambulatory Visit (INDEPENDENT_AMBULATORY_CARE_PROVIDER_SITE_OTHER): Payer: Medicare Other | Admitting: Neurology

## 2015-07-27 ENCOUNTER — Other Ambulatory Visit (INDEPENDENT_AMBULATORY_CARE_PROVIDER_SITE_OTHER): Payer: Medicare Other

## 2015-07-27 VITALS — BP 138/70 | HR 91 | Wt 186.0 lb

## 2015-07-27 DIAGNOSIS — G3184 Mild cognitive impairment, so stated: Secondary | ICD-10-CM

## 2015-07-27 LAB — VITAMIN B12: Vitamin B-12: 345 pg/mL (ref 211–911)

## 2015-07-27 NOTE — Patient Instructions (Signed)
We will check B12 level and methylmalonic acid level

## 2015-07-27 NOTE — Progress Notes (Signed)
NEUROLOGY FOLLOW UP OFFICE NOTE  [name] [id number]  HISTORY OF PRESENT ILLNESS: Shaun Moore is an 82 year old left-handed man with hypertension, hypothyroidism, CAD, and hypercholesterolemia who follows up for cognitive impairment.  UPDATE: B12 from August 2016 was 256.  CT of head from 10/29/14 was unremarkable. He briefly was on Aricept, which was discontinued due to diarrhea.  Overall, he has been doing well but his memory is perhaps a little worse.  Again, he easily forgets names of people he just met.  On occasion, he might forget details about an acquaintance, such as if they have children.  He does not get disoriented when driving on familiar routes.  He is able to work on his tractor or perform other chores without difficulty.  HISTORY: For about 2.5 years, he has had short-term memory problems, which have gradually progressed.  He frequently repeats himself, mostly stories about the past.  He will often repeat questions, too.  Sometimes, he will walk into a room and forget what he needed.  When he meets a new person, he will quickly forget their name.  He has no problems remembering names or recognizing acquaintances or family.  He has no trouble driving and denies disorientation when driving.  Once in a while, he may forget to take a medication, but usually it is not a problem.  He is able to recall content of what he sees on TV.  He denies trouble operating and performing everyday tasks.  He has not had change in personality.  He does not exhibit hallucinations or delusions.  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  He loved his job and lived to work.  Now, he doesn't do much during the day except sleep and watch TV.   TSH was found to be in the mid 3s, so he was started on levothyroxine 9m to treat mild hypothyroidism.  For suspected depression, he was started on Zoloft 263mdaily, which was subsequently discontinued due to diarrhea.  He  has no family history of dementia.  He has no personal history of head trauma or stroke.  He went to school up until the 9th grade.  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 Essential hypertension, benign   . Pure hyperglyceridemia   . CAD (coronary artery disease)   . NSTEMI (non-ST elevated myocardial infarction) ([facility]  . OA (osteoarthritis)   . BPH (benign prostatic hypertrophy)   . Rhinitis, allergic   . Esophageal reflux     MEDICATIONS: Current Outpatient Prescriptions on File Prior to Visit  Medication Sig Dispense Refill  . aspirin 81 MG tablet Take 81 mg by mouth daily.    . finasteride (PROSCAR) 5 MG tablet Take 5 mg by mouth daily.   2  . fish oil-omega-3 fatty acids 1000 MG capsule Take 2 g by mouth daily.    . Marland Kitchenevothyroxine (SYNTHROID, LEVOTHROID) 25 MCG tablet Take 25 mcg by mouth daily before breakfast.   0  . losartan (COZAAR) 50 MG tablet Take 50 mg by mouth daily.    . tamsulosin (FLOMAX) 0.4 MG CAPS Take 0.4 mg by mouth daily.     No current facility-administered medications on file prior to visit.    ALLERGIES: Allergies  Allergen Reactions  . Ambien [Zolpidem Tartrate]     Stated that is "made him crazy"    FAMILY  HISTORY: Family History  Problem Relation Age of Onset  . CAD Father   . Hypertension Sister   . Cancer Sister     renal cell     SOCIAL HISTORY: Social History   Social History  . Marital Status: Married    Spouse Name: N/A  . Number of Children: N/A  . Years of Education: N/A   Occupational History  . Not on file.   Social History Main Topics  . Smoking status: Never Smoker   . Smokeless tobacco: Never Used  . Alcohol Use: No  . Drug Use: No  . Sexual Activity:    Partners: Female   Other Topics Concern  . Not on file   Social History Narrative    REVIEW OF SYSTEMS: Constitutional: No fevers, chills, or sweats, no generalized fatigue,  change in appetite Eyes: No visual changes, double vision, eye pain Ear, nose and throat: No hearing loss, ear pain, nasal congestion, sore throat Cardiovascular: No chest pain, palpitations Respiratory:  No shortness of breath at rest or with exertion, wheezes GastrointestinaI: No nausea, vomiting, diarrhea, abdominal pain, fecal incontinence Genitourinary:  No dysuria, urinary retention or frequency Musculoskeletal:  No neck pain, back pain Integumentary: No rash, pruritus, skin lesions Neurological: as above Psychiatric: No depression, insomnia, anxiety Endocrine: No palpitations, fatigue, diaphoresis, mood swings, change in appetite, change in weight, increased thirst Hematologic/Lymphatic:  No purpura, petechiae. Allergic/Immunologic: no itchy/runny eyes, nasal congestion, recent allergic reactions, rashes  PHYSICAL EXAM: Filed Vitals:   07/27/15 0945  BP: 138/70  Pulse: 91   General: No acute distress.  Patient appears well-groomed.  normal body habitus. Head:  Normocephalic/atraumatic Eyes:  Fundi examined but not visualized Neck: supple, no paraspinal tenderness, full range of motion Heart:  Regular rate and rhythm Lungs:  Clear to auscultation bilaterally Back: No paraspinal tenderness Neurological Exam: alert and oriented to person, place, and time (but not year). Attention span and concentration intact, delayed recall poor, remote memory intact, fund of knowledge intact.  Speech fluent and not dysarthric, language intact.   Montreal Cognitive Assessment  07/27/2015 10/25/2014  Visuospatial/ Executive (0/5) 2 3  Naming (0/3) 3 3  Attention: Read list of digits (0/2) 2 2  Attention: Read list of letters (0/1) 1 1  Attention: Serial 7 subtraction starting at 100 (0/3) 1 2  Language: Repeat phrase (0/2) 1 1  Language : Fluency (0/1) 0 0  Abstraction (0/2) 1 2  Delayed Recall (0/5) 0 0  Orientation (0/6) 4 5  Total 15 19  Adjusted Score (based on education) 16 20   CN  II-XII intact. Bulk and tone normal, muscle strength 5/5 throughout.  Sensation to light touch, temperature and vibration intact.  Deep tendon reflexes 2+ throughout, toes downgoing.  Finger to nose and heel to shin testing intact.  Gait normal, Romberg negative.  IMPRESSION: Mild cognitive impairment versus early Alzheimer's.  Clinically, he appears independent but clearly has memory deficits outside the scope of normal aging.  PLAN: He didn't tolerate Aricept.  As long as he is stable and has not had any significant decline, we will hold off on starting another cholinesterase inhibitor.  Instead, we will recheck B12 and methylmalonic acid level and will treat accordingly.  Follow up in one year or as needed.  29 minutes spent face to face with patient, over 50% spent discussing diagnosis and management.  Metta Clines, DO  CC:  Mayra Neer, MD

## 2015-07-27 NOTE — Progress Notes (Signed)
Chart forwarded.  

## 2015-07-31 LAB — METHYLMALONIC ACID, SERUM: Methylmalonic Acid, Quant: 253 nmol/L (ref 87–318)

## 2015-08-03 ENCOUNTER — Telehealth: Payer: Self-pay

## 2015-08-03 NOTE — Telephone Encounter (Signed)
-----   Message from Drema DallasAdam R Jaffe, DO sent at 08/03/2015  7:01 AM EDT ----- Labs are normal.

## 2015-09-20 DIAGNOSIS — H6120 Impacted cerumen, unspecified ear: Secondary | ICD-10-CM | POA: Diagnosis not present

## 2015-12-14 ENCOUNTER — Other Ambulatory Visit: Payer: Self-pay | Admitting: Family Medicine

## 2015-12-14 ENCOUNTER — Ambulatory Visit
Admission: RE | Admit: 2015-12-14 | Discharge: 2015-12-14 | Disposition: A | Discharge status: Home / Self Care | Payer: Medicare Other | Source: Ambulatory Visit | Attending: Family Medicine | Admitting: Family Medicine

## 2015-12-14 DIAGNOSIS — T1490XA Injury, unspecified, initial encounter: Secondary | ICD-10-CM

## 2015-12-14 DIAGNOSIS — Z23 Encounter for immunization: Secondary | ICD-10-CM | POA: Diagnosis not present

## 2015-12-14 DIAGNOSIS — M7500 Adhesive capsulitis of unspecified shoulder: Secondary | ICD-10-CM | POA: Diagnosis not present

## 2015-12-14 DIAGNOSIS — W19XXXA Unspecified fall, initial encounter: Secondary | ICD-10-CM | POA: Diagnosis not present

## 2015-12-14 DIAGNOSIS — M25511 Pain in right shoulder: Secondary | ICD-10-CM | POA: Diagnosis not present

## 2015-12-14 DIAGNOSIS — R42 Dizziness and giddiness: Secondary | ICD-10-CM | POA: Diagnosis not present

## 2015-12-14 DIAGNOSIS — M7550 Bursitis of unspecified shoulder: Secondary | ICD-10-CM | POA: Diagnosis not present

## 2015-12-16 ENCOUNTER — Encounter: Payer: Self-pay | Admitting: Neurology

## 2015-12-16 ENCOUNTER — Ambulatory Visit (INDEPENDENT_AMBULATORY_CARE_PROVIDER_SITE_OTHER): Payer: Medicare Other | Admitting: Neurology

## 2015-12-16 VITALS — BP 138/72 | HR 86 | Wt 188.0 lb

## 2015-12-16 DIAGNOSIS — R4189 Other symptoms and signs involving cognitive functions and awareness: Secondary | ICD-10-CM | POA: Diagnosis not present

## 2015-12-16 DIAGNOSIS — R42 Dizziness and giddiness: Secondary | ICD-10-CM

## 2015-12-16 MED ORDER — RIVASTIGMINE 4.6 MG/24HR TD PT24: 4.6000 mg | MEDICATED_PATCH | Freq: Every day | TRANSDERMAL | 1 refills | Status: DC

## 2015-12-16 NOTE — Progress Notes (Signed)
Chart forwarded.  

## 2015-12-16 NOTE — Progress Notes (Addendum)
NEUROLOGY FOLLOW UP OFFICE NOTE  Shaun Moore [id number]  HISTORY OF PRESENT ILLNESS: Shaun Moore is an 82 year old left-handed man with hypertension, hypothyroidism, CAD, and hypercholesterolemia who follows up for cognitive impairment who now presents for vertigo and headache.   UPDATE: B12 from 07/27/15 was 345 and methymalonic acid level was 253.   On Monday, he bent over to lift up a heavy concrete block when he suddenly developed severe spinning sensation which caused him to fall into an embankment.  He thinks he may have passed out for just a moment.  He landed on his right shoulder, which has been a problem for him in the past.  He doesn't think he hit his head.  He had trouble getting up but was able to stand up after the third try.  He developed severe pain in his right shoulder.  He also reported a "heaviness" or "pressure" sensation in his head, but denied headache.  He denied double vision, slurred speech, neck pain or recurrent falls.  X-ray of the shoulder revealed no fracture.  Over the past week, the shoulder pain has been improving but still present.  The "head pressure" has resolved as well.  CT of head from 10/29/14 was unremarkable  PAST MEDICAL HISTORY: Past Medical History:  Diagnosis Date  . BPH (benign prostatic hypertrophy)   . CAD (coronary artery disease)   . Esophageal reflux   . Essential hypertension, benign   . NSTEMI (non-ST elevated myocardial infarction) (HCC)   . OA (osteoarthritis)   . Pure hyperglyceridemia   . Rhinitis, allergic     MEDICATIONS: Current Outpatient Prescriptions on File Prior to Visit  Medication Sig Dispense Refill  . aspirin 81 MG tablet Take 81 mg by mouth daily.    . finasteride (PROSCAR) 5 MG tablet Take 5 mg by mouth daily.   2  . fish oil-omega-3 fatty acids 1000 MG capsule Take 2 g by mouth daily.    Marland Kitchen. levothyroxine (SYNTHROID, LEVOTHROID) 25 MCG tablet Take 25 mcg by mouth daily before breakfast.   0  . losartan (COZAAR)  50 MG tablet Take 50 mg by mouth daily.    . tamsulosin (FLOMAX) 0.4 MG CAPS Take 0.4 mg by mouth daily.     No current facility-administered medications on file prior to visit.     ALLERGIES: Allergies  Allergen Reactions  . Ambien [Zolpidem Tartrate]     Stated that is "made him crazy"  . Lisinopril Cough    FAMILY HISTORY: Family History  Problem Relation Age of Onset  . CAD Father   . Hypertension Sister   . Cancer Sister     renal cell     SOCIAL HISTORY: Social History   Social History  . Marital status: Married    Spouse name: N/A  . Number of children: N/A  . Years of education: N/A   Occupational History  . Not on file.   Social History Main Topics  . Smoking status: Never Smoker  . Smokeless tobacco: Never Used  . Alcohol use No  . Drug use: No  . Sexual activity: Yes    Partners: Female   Other Topics Concern  . Not on file   Social History Narrative  . No narrative on file    REVIEW OF SYSTEMS: Constitutional: No fevers, chills, or sweats, no generalized fatigue, change in appetite Eyes: No visual changes, double vision, eye pain Ear, nose and throat: No hearing loss, ear pain, nasal congestion, sore  throat Cardiovascular: No chest pain, palpitations Respiratory:  No shortness of breath at rest or with exertion, wheezes GastrointestinaI: No nausea, vomiting, diarrhea, abdominal pain, fecal incontinence Genitourinary:  No dysuria, urinary retention or frequency Musculoskeletal:  No neck pain, back pain Integumentary: No rash, pruritus, skin lesions Neurological: as above Psychiatric: No depression, insomnia, anxiety Endocrine: No palpitations, fatigue, diaphoresis, mood swings, change in appetite, change in weight, increased thirst Hematologic/Lymphatic:  No purpura, petechiae. Allergic/Immunologic: no itchy/runny eyes, nasal congestion, recent allergic reactions, rashes  PHYSICAL EXAM: Vitals:   12/16/15 0751  BP: 138/72  Pulse: 86    General: No acute distress.  Patient appears well-groomed.  normal body habitus. Head:  Normocephalic/atraumatic Eyes:  Fundi examined but not visualized Neck: supple, no paraspinal tenderness, full range of motion Heart:  Regular rate and rhythm Lungs:  Clear to auscultation bilaterally Back: No paraspinal tenderness Neurological Exam: alert and oriented to person and place but not time. Attention span and concentration intact, delayed recall poor, remote memory intact, fund of knowledge intact.  Speech fluent and not dysarthric, language intact.  CN II-XII intact. Bulk and tone normal, muscle strength 5/5 throughout.  Sensation to light touch  intact.  Deep tendon reflexes 2+ throughout.  Finger to nose testing intact.  Gait normal, Romberg negative.   IMPRESSION: Dizziness and fall.  Possibly positional vertigo triggered by getting up fast or vasovagal reaction from lifting the concrete block.   Dementia without behavioral changes.  PLAN: Since he is clinically better and he does not exhibit lateralizing symptoms on exam, I don't think imaging is warranted.  If he should develop worsening symptoms like recurrence of head pressure or even headache, or vertigo or other focal symptoms, then imaging (CT of head) would be warranted.  For memory, we will try Exelon patch 4.6mg .  He was also advised to start B12 1000 mcg daily and recheck level prior to follow up in May.  28 minutes spent face to face with patient, over 50% spent counseling.  Shon Millet, DO  CC:  Cam Hai, MD

## 2015-12-16 NOTE — Patient Instructions (Signed)
1.  Since you are feeling better and your exam doesn't show anything concerning, I don't think further testing is needed.  If symptoms like dizziness or head pressure returns, then contact us. 2.  For memory, start B12 pill, 1000 mcg daily.  It is bought over the counter.  We will recheck B12 level prior to follow up in May. 3.  We will start you on a patch for memory called rivastigmine (Exelon).  Take 4.6mg  patch (switch patch daily) and contact us in 4 months for refill (if tolerating, will increase dose). 4.  Follow up as scheduled in May.

## 2015-12-20 ENCOUNTER — Telehealth: Payer: Self-pay

## 2015-12-20 NOTE — Telephone Encounter (Signed)
If he is doing okay now, then I wouldn't do anything else.  I think he is just having vertigo.  If it recurs, he should sit and take it easy.   If he develops different symptoms (slurred speech, facial droop, etc) or if the dizziness is prolonged and non-stop, then they should contact us or go to ED.

## 2015-12-20 NOTE — Telephone Encounter (Signed)
Wife called to report some dizziness on Sunday. Pt was in church, stated that pew began to spin around him. Pt has not had another episode since Sunday. Pt d/c B12 100 mcg on Sunday because he thought that may have caused the dizzy spell. Please advise.   [phone]

## 2015-12-20 NOTE — Telephone Encounter (Signed)
Left message on machine for pt to return call to the office.  

## 2015-12-20 NOTE — Telephone Encounter (Signed)
I would try it again.  I don't think the B12 had anything to do with it.

## 2015-12-20 NOTE — Telephone Encounter (Signed)
Should he try the B12 again? OR stay off of it? Please advise.

## 2016-02-06 ENCOUNTER — Encounter: Payer: Self-pay | Admitting: Neurology

## 2016-02-06 ENCOUNTER — Other Ambulatory Visit: Payer: Self-pay | Admitting: Neurology

## 2016-02-06 ENCOUNTER — Encounter: Payer: Self-pay | Admitting: Cardiology

## 2016-02-06 DIAGNOSIS — E782 Mixed hyperlipidemia: Secondary | ICD-10-CM | POA: Diagnosis not present

## 2016-02-06 DIAGNOSIS — Z0000 Encounter for general adult medical examination without abnormal findings: Secondary | ICD-10-CM | POA: Diagnosis not present

## 2016-02-06 DIAGNOSIS — F322 Major depressive disorder, single episode, severe without psychotic features: Secondary | ICD-10-CM | POA: Diagnosis not present

## 2016-02-06 DIAGNOSIS — N183 Chronic kidney disease, stage 3 (moderate): Secondary | ICD-10-CM | POA: Diagnosis not present

## 2016-02-06 DIAGNOSIS — R413 Other amnesia: Secondary | ICD-10-CM | POA: Diagnosis not present

## 2016-02-06 DIAGNOSIS — E039 Hypothyroidism, unspecified: Secondary | ICD-10-CM | POA: Diagnosis not present

## 2016-02-06 DIAGNOSIS — I1310 Hypertensive heart and chronic kidney disease without heart failure, with stage 1 through stage 4 chronic kidney disease, or unspecified chronic kidney disease: Secondary | ICD-10-CM | POA: Diagnosis not present

## 2016-02-06 DIAGNOSIS — R972 Elevated prostate specific antigen [PSA]: Secondary | ICD-10-CM | POA: Diagnosis not present

## 2016-02-06 DIAGNOSIS — K5730 Diverticulosis of large intestine without perforation or abscess without bleeding: Secondary | ICD-10-CM | POA: Diagnosis not present

## 2016-02-06 DIAGNOSIS — I25119 Atherosclerotic heart disease of native coronary artery with unspecified angina pectoris: Secondary | ICD-10-CM | POA: Diagnosis not present

## 2016-02-28 ENCOUNTER — Encounter (HOSPITAL_COMMUNITY): Payer: Self-pay | Admitting: Neurology

## 2016-02-28 ENCOUNTER — Emergency Department (HOSPITAL_COMMUNITY): Payer: Medicare Other

## 2016-02-28 ENCOUNTER — Emergency Department (HOSPITAL_COMMUNITY)
Admission: EM | Admit: 2016-02-28 | Discharge: 2016-02-28 | Disposition: A | Discharge status: Home / Self Care | Payer: Medicare Other | Attending: Emergency Medicine | Admitting: Emergency Medicine

## 2016-02-28 DIAGNOSIS — R51 Headache: Secondary | ICD-10-CM | POA: Diagnosis not present

## 2016-02-28 DIAGNOSIS — Z7982 Long term (current) use of aspirin: Secondary | ICD-10-CM | POA: Insufficient documentation

## 2016-02-28 DIAGNOSIS — R531 Weakness: Secondary | ICD-10-CM | POA: Diagnosis not present

## 2016-02-28 DIAGNOSIS — I2510 Atherosclerotic heart disease of native coronary artery without angina pectoris: Secondary | ICD-10-CM | POA: Diagnosis not present

## 2016-02-28 DIAGNOSIS — I252 Old myocardial infarction: Secondary | ICD-10-CM | POA: Diagnosis not present

## 2016-02-28 DIAGNOSIS — R0602 Shortness of breath: Secondary | ICD-10-CM | POA: Diagnosis not present

## 2016-02-28 DIAGNOSIS — I10 Essential (primary) hypertension: Secondary | ICD-10-CM | POA: Diagnosis not present

## 2016-02-28 DIAGNOSIS — R918 Other nonspecific abnormal finding of lung field: Secondary | ICD-10-CM | POA: Diagnosis not present

## 2016-02-28 DIAGNOSIS — Z79899 Other long term (current) drug therapy: Secondary | ICD-10-CM | POA: Diagnosis not present

## 2016-02-28 DIAGNOSIS — R519 Headache, unspecified: Secondary | ICD-10-CM

## 2016-02-28 DIAGNOSIS — R05 Cough: Secondary | ICD-10-CM | POA: Diagnosis not present

## 2016-02-28 HISTORY — DX: Dorsalgia, unspecified: M54.9

## 2016-02-28 HISTORY — DX: Other symptoms and signs involving cognitive functions and awareness: R41.89

## 2016-02-28 HISTORY — DX: Headache, unspecified: R51.9

## 2016-02-28 HISTORY — DX: Dizziness and giddiness: R42

## 2016-02-28 HISTORY — DX: Headache: R51

## 2016-02-28 LAB — CBC WITH DIFFERENTIAL/PLATELET
BASOS ABS: 0 10*3/uL (ref 0.0–0.1)
BASOS PCT: 0 %
EOS PCT: 1 %
Eosinophils Absolute: 0.1 10*3/uL (ref 0.0–0.7)
HCT: 42.2 % (ref 39.0–52.0)
Hemoglobin: 13.7 g/dL (ref 13.0–17.0)
Lymphocytes Relative: 17 %
Lymphs Abs: 1.2 10*3/uL (ref 0.7–4.0)
MCH: 31.4 pg (ref 26.0–34.0)
MCHC: 32.5 g/dL (ref 30.0–36.0)
MCV: 96.6 fL (ref 78.0–100.0)
MONO ABS: 1.4 10*3/uL — AB (ref 0.1–1.0)
Monocytes Relative: 20 %
Neutro Abs: 4.2 10*3/uL (ref 1.7–7.7)
Neutrophils Relative %: 62 %
PLATELETS: 186 10*3/uL (ref 150–400)
RBC: 4.37 MIL/uL (ref 4.22–5.81)
RDW: 13.6 % (ref 11.5–15.5)
WBC: 6.8 10*3/uL (ref 4.0–10.5)

## 2016-02-28 LAB — COMPREHENSIVE METABOLIC PANEL
ALBUMIN: 3.6 g/dL (ref 3.5–5.0)
ALT: 15 U/L — ABNORMAL LOW (ref 17–63)
AST: 25 U/L (ref 15–41)
Alkaline Phosphatase: 84 U/L (ref 38–126)
Anion gap: 10 (ref 5–15)
BUN: 16 mg/dL (ref 6–20)
CHLORIDE: 96 mmol/L — AB (ref 101–111)
CO2: 31 mmol/L (ref 22–32)
Calcium: 8.9 mg/dL (ref 8.9–10.3)
Creatinine, Ser: 1.57 mg/dL — ABNORMAL HIGH (ref 0.61–1.24)
GFR calc Af Amer: 44 mL/min — ABNORMAL LOW (ref >60)
GFR calc non Af Amer: 38 mL/min — ABNORMAL LOW (ref >60)
GLUCOSE: 112 mg/dL — AB (ref 65–99)
POTASSIUM: 3.5 mmol/L (ref 3.5–5.1)
SODIUM: 137 mmol/L (ref 135–145)
Total Bilirubin: 1.2 mg/dL (ref 0.3–1.2)
Total Protein: 6.2 g/dL — ABNORMAL LOW (ref 6.5–8.1)

## 2016-02-28 LAB — TROPONIN I

## 2016-02-28 MED ORDER — ACETAMINOPHEN 325 MG PO TABS: 650.0000 mg | ORAL_TABLET | Freq: Once | ORAL | Status: DC

## 2016-02-28 NOTE — ED Notes (Signed)
Patient transported to CT 

## 2016-02-28 NOTE — ED Provider Notes (Signed)
[facility]-EMERGENCY DEPT Provider Note   CSN: [id number] Arrival date & time: 02/28/16  1478     History   Chief Complaint Chief Complaint  Patient presents with  . Headache    HPI Shaun Moore is a 82 y.o. male.  The history is provided by the patient and the spouse. The history is limited by the condition of the patient (Hx dementia).  Headache    Pt was seen at 0955. Per pt and his spouse, c/o gradual onset and persistence of intermittent "pressure in my head" for the past 2 months. Denies head pain. Pt states he has been evaluated by his Neuro MD "with a bunch of tests" without definitive dx. Pt states he is "here today for answers." Denies any change in his symptoms over the past 2 months. Denies injury, no vertigo, no near syncope, no visual changes, no focal motor weakness, no tingling/numbness in extremities, no ataxia, no slurred speech, no facial droop. Pt's wife also states pt "has been coughing for a few days" and "wants that checked out too." States he was evaluated by his PMD for his cough, and told "it wasn't the flu." Denies CP/palpitations, no SOB, no abd pain, no N/V/D, no fevers.     Past Medical History:  Diagnosis Date  . Back pain   . BPH (benign prostatic hypertrophy)   . CAD (coronary artery disease)   . Cognitive impairment    dementia  . Esophageal reflux   . Essential hypertension, benign   . Headache    "pressure or heaviness in head"  . NSTEMI (non-ST elevated myocardial infarction) (HCC)   . OA (osteoarthritis)   . Pure hyperglyceridemia   . Rhinitis, allergic   . Vertigo     Patient Active Problem List   Diagnosis Date Noted  . Amnestic MCI (mild cognitive impairment with memory loss) 10/25/2014  . Coronary artery disease involving native coronary artery of native heart without angina pectoris 11/02/2013  . Pure hypercholesterolemia 11/02/2013  . Daytime somnolence 11/02/2013  . Essential hypertension, benign 11/02/2013    Past Surgical  History:  Procedure Laterality Date  . CARDIAC CATHETERIZATION     DE stent RCA and LAD       Home Medications    Prior to Admission medications   Medication Sig Start Date End Date Taking? Authorizing Provider  aspirin 81 MG tablet Take 81 mg by mouth daily.    Historical Provider, MD  finasteride (PROSCAR) 5 MG tablet Take 5 mg by mouth daily.  09/28/14   Historical Provider, MD  fish oil-omega-3 fatty acids 1000 MG capsule Take 2 g by mouth daily.    Historical Provider, MD  levothyroxine (SYNTHROID, LEVOTHROID) 25 MCG tablet Take 25 mcg by mouth daily before breakfast.  09/27/14   Historical Provider, MD  losartan (COZAAR) 50 MG tablet Take 50 mg by mouth daily.    Historical Provider, MD  rivastigmine (EXELON) 4.6 mg/24hr Place 1 patch (4.6 mg total) onto the skin daily. 02/07/16   Drema Dallas, DO  tamsulosin (FLOMAX) 0.4 MG CAPS Take 0.4 mg by mouth daily.    Historical Provider, MD    Family History Family History  Problem Relation Age of Onset  . CAD Father   . Hypertension Sister   . Cancer Sister     renal cell     Social History Social History  Substance Use Topics  . Smoking status: Never Smoker  . Smokeless tobacco: Never Used  . Alcohol use No  Allergies   Ambien [zolpidem tartrate] and Lisinopril   Review of Systems Review of Systems  Unable to perform ROS: Dementia  Neurological: Positive for headaches.     Physical Exam Updated Vital Signs BP 141/84 (BP Location: Left Arm)   Pulse 89   Temp 97.6 F (36.4 C) (Oral)   Resp 18   SpO2 96%   Physical Exam 1000: Physical examination:  Nursing notes reviewed; Vital signs and O2 SAT reviewed;  Constitutional: Well developed, Well nourished, Well hydrated, In no acute distress; Head:  Normocephalic, atraumatic; Eyes: EOMI, PERRL, No scleral icterus; ENMT: +edemetous nasal turbinates bilat with clear rhinorrhea.  Mouth and pharynx normal, Mucous membranes moist; Neck: Supple, Full range of motion,  No lymphadenopathy; Cardiovascular: Regular rate and rhythm, No gallop; Respiratory: Breath sounds clear & equal bilaterally, No wheezes.  Speaking full sentences with ease, Normal respiratory effort/excursion; Chest: Nontender, Movement normal; Abdomen: Soft, Nontender, Nondistended, Normal bowel sounds; Genitourinary: No CVA tenderness; Extremities: Pulses normal, No tenderness, No edema, No calf edema or asymmetry.; Neuro: Awake, alert, confused re: events, time. +HOH, otherwise major CN grossly intact. No facial droop. Speech clear. No gross focal motor or sensory deficits in extremities.; Skin: Color normal, Warm, Dry.   ED Treatments / Results  Labs (all labs ordered are listed, but only abnormal results are displayed)   EKG  EKG Interpretation  Date/Time:  Tuesday February 28 2016 09:49:04 EST Ventricular Rate:  93 PR Interval:  150 QRS Duration: 144 QT Interval:  390 QTC Calculation: 484 R Axis:   95 Text Interpretation:  Normal sinus rhythm Right bundle branch block Inferior infarct , age undetermined When compared with ECG of 11/11/2006 No significant change was found Confirmed by [facility]  MD, Nicholos JohnsKATHLEEN [phone]([id number]) on 02/28/2016 10:13:25 AM       Radiology   Procedures Procedures (including critical care time)  Medications Ordered in ED Medications  acetaminophen (TYLENOL) tablet 650 mg (not administered)     Initial Impression / Assessment and Plan / ED Course  I have reviewed the triage vital signs and the nursing notes.  Pertinent labs & imaging results that were available during my care of the patient were reviewed by me and considered in my medical decision making (see chart for details).  MDM Reviewed: previous chart, nursing note and vitals Reviewed previous: labs, ECG and CT scan Interpretation: MRI, x-ray, ECG and labs   Results for orders placed or performed during the hospital encounter of 02/28/16  CBC with Differential  Result Value Ref Range   WBC 6.8  4.0 - 10.5 K/uL   RBC 4.37 4.22 - 5.81 MIL/uL   Hemoglobin 13.7 13.0 - 17.0 g/dL   HCT 86.542.2 78.439.0 - 69.652.0 %   MCV 96.6 78.0 - 100.0 fL   MCH 31.4 26.0 - 34.0 pg   MCHC 32.5 30.0 - 36.0 g/dL   RDW 29.513.6 28.411.5 - 13.215.5 %   Platelets 186 150 - 400 K/uL   Neutrophils Relative % 62 %   Neutro Abs 4.2 1.7 - 7.7 K/uL   Lymphocytes Relative 17 %   Lymphs Abs 1.2 0.7 - 4.0 K/uL   Monocytes Relative 20 %   Monocytes Absolute 1.4 (H) 0.1 - 1.0 K/uL   Eosinophils Relative 1 %   Eosinophils Absolute 0.1 0.0 - 0.7 K/uL   Basophils Relative 0 %   Basophils Absolute 0.0 0.0 - 0.1 K/uL  Comprehensive metabolic panel  Result Value Ref Range   Sodium 137 135 - 145 mmol/L  Potassium 3.5 3.5 - 5.1 mmol/L   Chloride 96 (L) 101 - 111 mmol/L   CO2 31 22 - 32 mmol/L   Glucose, Bld 112 (H) 65 - 99 mg/dL   BUN 16 6 - 20 mg/dL   Creatinine, Ser 4.401.57 (H) 0.61 - 1.24 mg/dL   Calcium 8.9 8.9 - 10.210.3 mg/dL   Total Protein 6.2 (L) 6.5 - 8.1 g/dL   Albumin 3.6 3.5 - 5.0 g/dL   AST 25 15 - 41 U/L   ALT 15 (L) 17 - 63 U/L   Alkaline Phosphatase 84 38 - 126 U/L   Total Bilirubin 1.2 0.3 - 1.2 mg/dL   GFR calc non Af Amer 38 (L) >60 mL/min   GFR calc Af Amer 44 (L) >60 mL/min   Anion gap 10 5 - 15  Troponin I  Result Value Ref Range   Troponin I <0.03 <0.03 ng/mL   Dg Chest 2 View Result Date: 02/28/2016 CLINICAL DATA:  82 year old male with cough, and head ache for the past 2 months EXAM: CHEST  2 VIEW COMPARISON:  Prior chest x-ray 02/21/2012 FINDINGS: Cardiac and mediastinal contours remain within normal limits. Atherosclerotic calcifications are present in the transverse aorta. Interval development of an approximately 7 mm nodular opacity overlying the anterior aspect of the right second rib. This was not clearly seen on prior images although it is dense for size and may be calcified. Otherwise, no focal airspace consolidation, pulmonary edema, pleural effusion or pneumothorax. Background bronchitic changes  remain stable. No acute osseous abnormality. IMPRESSION: 1. No acute cardiopulmonary process. 2. Possible 7 mm nodular opacity in the right upper lung which was not seen on prior imaging. Consider further evaluation with dedicated chest CT without intravenous contrast. 3.  Aortic Atherosclerosis (ICD10-170.0). 4. Stable chronic bronchitic changes. Electronically Signed   By: Malachy MoanHeath  McCullough M.D.   On: 02/28/2016 10:31   Ct Chest Wo Contrast Result Date: 02/28/2016 CLINICAL DATA:  Possible nodule on recent chest x-ray, shortness of Breath EXAM: CT CHEST WITHOUT CONTRAST TECHNIQUE: Multidetector CT imaging of the chest was performed following the standard protocol without IV contrast. COMPARISON:  Chest x-ray from earlier in the same day FINDINGS: Cardiovascular: Somewhat limited due the lack of IV contrast. Diffuse aortic calcifications are noted. Coronary calcifications are seen. No significant cardiac enlargement is noted. Mediastinum/Nodes: Thoracic inlet is within normal limits. No hilar or mediastinal adenopathy is seen. No axillary adenopathy is noted. Lungs/Pleura: Lungs are well aerated bilaterally. No focal infiltrate or sizable parenchymal nodule is seen. The abnormality seen on recent chest x-ray is related to a a focus of vertebral augmentation cement within branches of the pulmonary artery in the right upper lobe. Scattered other foci of cement are noted throughout the right lung. Upper Abdomen: Within normal limits. Musculoskeletal: Degenerative changes of the thoracic spine are noted. IMPRESSION: No parenchymal nodule is identified to correspond with the changes seen all on recent plain film examination. That density is related to deposition of contrast laden cement from prior vertebral augmentation. 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 No acute sequelae are noted related to this. No other focal abnormality is noted. Electronically Signed   By: Alcide CleverMark  Lukens  M.D.   On: 02/28/2016 11:33   Mr Brain Wo Contrast (neuro Protocol) Result Date: 02/28/2016 CLINICAL DATA:  Headache off and on for several months. Presyncope. Stroke risk factors include hypertension, hyperlipidemia, and previous myocardial infarction. EXAM: MRI HEAD WITHOUT CONTRAST TECHNIQUE: Multiplanar, multiecho pulse  sequences of the brain and surrounding structures were obtained without intravenous contrast. COMPARISON:  CT head 10/29/2014. FINDINGS: The patient was unable to remain motionless for the exam. Small or subtle lesions could be overlooked. Brain: No evidence for acute infarction, hemorrhage, mass lesion, hydrocephalus, or extra-axial fluid. Mild to moderate cerebral and cerebellar atrophy, not unexpected for age. Mild subcortical and periventricular T2 and FLAIR hyperintensities, likely chronic microvascular ischemic change. Vascular: Flow voids are maintained throughout the carotid, basilar, and vertebral arteries. There are no areas of chronic hemorrhage. RIGHT vertebral dominant. Skull and upper cervical spine: Unremarkable visualized calvarium, skullbase, and cervical vertebrae. Pituitary, pineal, cerebellar tonsils unremarkable. Cervical spondylosis. Sinuses/Orbits: No layering sinus fluid. Negative orbits. BILATERAL cataract extraction. Other: No mastoid fluid. Negative nasopharynx. Scalp soft tissues unremarkable. Compared with prior CT, similar appearance. IMPRESSION: Motion degraded exam demonstrating atrophy and small vessel disease. No acute intracranial findings. Electronically Signed   By: Elsie Stain M.D.   On: 02/28/2016 13:44    1000:  Pt and wife both state they have come to the ED today for definitive dx for his "head pressure." Long d/w pt and wife regarding role of ED in health care continuum, and realistic expectations for ED visit set. Pt and family verb understanding. Workup ordered.   1250:  T/C to Pulm Dr. Jamison Neighbor, case discussed, including:  HPI, pertinent  PM/SHx, VS/PE, dx testing, ED course and treatment: doesn't believe CT findings would be an acute issue, IR may have further advice. T/C to IR Dr. Loreta Ave, case discussed, including:  HPI, pertinent PM/SHx, VS/PE, dx testing, ED course and treatment:  He has viewed the images, states pt had vertebroplasty on L3 in 2008 and there was extravasation of cement into venous plexus seen on the images at that time, states this is known risk of this procedure and most IR MD's will explain this as part of consent for procedure process, not dangerous finding, not causing symptoms for today's complaint (head pressure).   1435:  BUN/Cr near baseline. Pt feels better and wants to go home now. Tx symptomatically, f/u Neuro MD. Dx and testing, as well as d/w IR MD, d/w pt and family.  Questions answered.  Verb understanding, agreeable to d/c home with outpt f/u.      Final Clinical Impressions(s) / ED Diagnoses   Final diagnoses:  None    New Prescriptions New Prescriptions   No medications on file      Samuel Jester, DO 03/02/16 1823

## 2016-02-28 NOTE — ED Notes (Signed)
Pt remains in mri.

## 2016-02-28 NOTE — ED Triage Notes (Signed)
Pt is here c/o "pressure on my head" that has been going for several months and " you can't figure out what it is". Pt here with wife reporting they called EMS this morning b/c he felt like he was going to pass out. They checked him out and decided he should go to his PCP this morning. He did and they sent him here. Denies CP or SOB, but has cough and "pressure in his head" that is not a h/a. "The doctor over there told me  To come here so we could tell him what it is"

## 2016-02-28 NOTE — ED Notes (Signed)
Patient transported to X-ray 

## 2016-02-28 NOTE — Discharge Instructions (Signed)
Take over the counter tylenol, as directed on packaging, as needed for discomfort.  Call your regular medical doctor and your Neurologist today to schedule a follow up appointment within the next 3 days.  Return to the Emergency Department immediately sooner if worsening.

## 2016-02-28 NOTE — ED Notes (Signed)
Pt returned from mri

## 2016-02-28 NOTE — ED Notes (Signed)
Pt remains in mri, family updated

## 2016-02-28 NOTE — ED Notes (Signed)
Gave pt a cup of water per Courtney(RN)

## 2016-06-25 ENCOUNTER — Other Ambulatory Visit: Payer: Self-pay | Admitting: Neurology

## 2016-07-26 ENCOUNTER — Encounter: Payer: Self-pay | Admitting: Neurology

## 2016-07-26 ENCOUNTER — Ambulatory Visit (INDEPENDENT_AMBULATORY_CARE_PROVIDER_SITE_OTHER): Payer: Medicare Other | Admitting: Neurology

## 2016-07-26 VITALS — BP 122/60 | HR 76 | Ht 69.75 in | Wt 189.0 lb

## 2016-07-26 DIAGNOSIS — F0390 Unspecified dementia without behavioral disturbance: Secondary | ICD-10-CM | POA: Diagnosis not present

## 2016-07-26 MED ORDER — RIVASTIGMINE 9.5 MG/24HR TD PT24: 9.5000 mg | MEDICATED_PATCH | Freq: Every day | TRANSDERMAL | 0 refills | Status: DC

## 2016-07-26 NOTE — Progress Notes (Signed)
NEUROLOGY FOLLOW UP OFFICE NOTE  Shaun Moore [id number]  HISTORY OF PRESENT ILLNESS: Shaun Moore is an 83 year old left-handed man with hypertension, hypothyroidism, CAD, and hypercholesterolemia who follows up for cognitive impairment.  History supplemented by his wife who accompanies him.   UPDATE: He is tolerating Exelon 4.6mg  patch. Short-term memory has gotten a little worse as per his wife.  He is still driving and has not had any problems.  HISTORY: For about 4 years, he has had short-term memory problems, which have gradually progressed.  He frequently repeats himself, mostly stories about the past.  He will often repeat questions, too.  Sometimes, he will walk into a room and forget what he needed.  When he meets a new person, he will quickly forget their name.  He has no problems remembering names or recognizing acquaintances or family.  He has no trouble driving and denies disorientation when driving.  Once in a while, he may forget to take a medication, but usually it is not a problem.  He is able to recall content of what he sees on TV.  He denies trouble operating and performing everyday tasks.  He has not had change in personality.  He does not exhibit hallucinations or delusions.  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  He loved his job and lived to work.  He has no family history of dementia.  He has no personal history of head trauma or stroke.  He went to school up until the 9th grade.  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  CT of head from 10/29/14 was unremarkable B12 from 07/27/15 was 345 and methymalonic acid level was 253.  He didn't tolerate Aricept.  PAST MEDICAL HISTORY: Past Medical History:  Diagnosis Date  . Back pain   . BPH (benign prostatic hypertrophy)   . CAD (coronary artery disease)   . Cognitive impairment    dementia  . Esophageal reflux   . Essential hypertension, benign   .  Headache    "pressure or heaviness in head"  . NSTEMI (non-ST elevated myocardial infarction) (HCC)   . OA (osteoarthritis)   . Pure hyperglyceridemia   . Rhinitis, allergic   . Vertigo     MEDICATIONS: Current Outpatient Prescriptions on File Prior to Visit  Medication Sig Dispense Refill  . aspirin 81 MG tablet Take 81 mg by mouth daily.    . finasteride (PROSCAR) 5 MG tablet Take 5 mg by mouth daily.   2  . fish oil-omega-3 fatty acids 1000 MG capsule Take 2 g by mouth daily.    Marland Kitchen levothyroxine (SYNTHROID, LEVOTHROID) 25 MCG tablet Take 25 mcg by mouth daily before breakfast.   0  . losartan (COZAAR) 50 MG tablet Take 50 mg by mouth daily.    . tamsulosin (FLOMAX) 0.4 MG CAPS Take 0.4 mg by mouth daily.     No current facility-administered medications on file prior to visit.     ALLERGIES: Allergies  Allergen Reactions  . Ambien [Zolpidem Tartrate]     Stated that is "made him crazy"  . Lisinopril Cough    FAMILY HISTORY: Family History  Problem Relation Age of Onset  . CAD Father   . Hypertension Sister   . Cancer Sister        renal cell     SOCIAL HISTORY: Social History   Social History  . Marital status: Married  Spouse name: N/A  . Number of children: N/A  . Years of education: N/A   Occupational History  . Not on file.   Social History Main Topics  . Smoking status: Never Smoker  . Smokeless tobacco: Never Used  . Alcohol use No  . Drug use: No  . Sexual activity: Yes    Partners: Female   Other Topics Concern  . Not on file   Social History Narrative  . No narrative on file    REVIEW OF SYSTEMS: Constitutional: No fevers, chills, or sweats, no generalized fatigue, change in appetite Eyes: No visual changes, double vision, eye pain Ear, nose and throat: No hearing loss, ear pain, nasal congestion, sore throat Cardiovascular: No chest pain, palpitations Respiratory:  No shortness of breath at rest or with exertion,  wheezes GastrointestinaI: No nausea, vomiting, diarrhea, abdominal pain, fecal incontinence Genitourinary:  No dysuria, urinary retention or frequency Musculoskeletal:  No neck pain, back pain Integumentary: No rash, pruritus, skin lesions Neurological: as above Psychiatric: No depression, insomnia, anxiety Endocrine: No palpitations, fatigue, diaphoresis, mood swings, change in appetite, change in weight, increased thirst Hematologic/Lymphatic:  No purpura, petechiae. Allergic/Immunologic: no itchy/runny eyes, nasal congestion, recent allergic reactions, rashes  PHYSICAL EXAM: Vitals:   07/26/16 1106  BP: 122/60  Pulse: 76   General: No acute distress.  Patient appears well-groomed.  normal body habitus. Head:  Normocephalic/atraumatic Eyes:  Fundi examined but not visualized Neck: supple, no paraspinal tenderness, full range of motion Heart:  Regular rate and rhythm Lungs:  Clear to auscultation bilaterally Back: No paraspinal tenderness Neurological Exam: alert and oriented to person, place, and time except he said year was [id number]. Attention span and concentration intact, recent memory fair and remote memory intact, fund of knowledge intact.  Speech fluent and not dysarthric, language intact. Unable to complete trail making test correctly.  Able to draw a clock.  Unable to draw intersecting pentagons.  MMSE - Mini Mental State Exam 07/26/2016  Orientation to time 4  Orientation to Place 3  Registration 2  Attention/ Calculation 4  Recall 2  Language- name 2 objects 2  Language- repeat 0  Language- follow 3 step command 2  Language- read & follow direction 1  Write a sentence 0  Copy design 0  Total score 20   CN II-XII intact. Bulk and tone normal, muscle strength 5/5 throughout.  Sensation to light touch, temperature and vibration intact.  Deep tendon reflexes 2+ throughout.  Finger to nose testing intact.  Gait normal.  IMPRESSION: Mild dementia, probably  Alzheimer's  PLAN: 1.  Will increase Exelon patch to 9.5mg  daily.  Contact us in 4 weeks with update. 2.  Drive only with wife. 3.  Follow up in one year  26 minutes spent face to face with patient, over 50% spent discussing management.  Shaun MilletAdam Jaffe, DO  CC:  Lupita RaiderKimberlee Shaw, MD

## 2016-07-26 NOTE — Patient Instructions (Signed)
1.  Stop the 4.6 mg patch.  Instead, start the 9.5mg  patch.  Apply one patch and change every 24 hours.  In one month, contact us with update. 2.  Drive only with your wife in the car. 3.  Follow up in one year or as needed.

## 2016-08-13 DIAGNOSIS — N183 Chronic kidney disease, stage 3 (moderate): Secondary | ICD-10-CM | POA: Diagnosis not present

## 2016-08-13 DIAGNOSIS — I25119 Atherosclerotic heart disease of native coronary artery with unspecified angina pectoris: Secondary | ICD-10-CM | POA: Diagnosis not present

## 2016-08-13 DIAGNOSIS — I1310 Hypertensive heart and chronic kidney disease without heart failure, with stage 1 through stage 4 chronic kidney disease, or unspecified chronic kidney disease: Secondary | ICD-10-CM | POA: Diagnosis not present

## 2016-08-13 DIAGNOSIS — F0390 Unspecified dementia without behavioral disturbance: Secondary | ICD-10-CM | POA: Diagnosis not present

## 2016-08-28 ENCOUNTER — Telehealth: Payer: Self-pay | Admitting: Neurology

## 2016-08-28 MED ORDER — RIVASTIGMINE 13.3 MG/24HR TD PT24: 13.3000 mg | MEDICATED_PATCH | Freq: Every day | TRANSDERMAL | 2 refills | Status: DC

## 2016-08-28 NOTE — Telephone Encounter (Signed)
Spoke with patient's wife. He is doing okay on Exelon 9.5 mg patches. Only side effect is some redness where the patch goes. She states it was mentioned increasing this in 4 weeks. She hasn't noticed a difference in patient. Please advise.

## 2016-08-28 NOTE — Telephone Encounter (Signed)
Patient's wife made aware. RX sent to the pharmacy. Made aware that can use flonase on skin to help with redness.

## 2016-08-28 NOTE — Telephone Encounter (Signed)
PT's wife called in regards to his medication rivastigmine and making the dosage higher

## 2016-08-28 NOTE — Telephone Encounter (Signed)
Yes, we can increase dose to 13.3mg  patch, changing every 24 hours.

## 2016-10-04 ENCOUNTER — Telehealth: Payer: Self-pay | Admitting: Neurology

## 2016-10-04 NOTE — Telephone Encounter (Signed)
Left message for patient's wife giving her instructions about the exelon patch.  Instructed her to call with any questions or if she needs another Rx.

## 2016-10-04 NOTE — Telephone Encounter (Signed)
Please advise 

## 2016-10-04 NOTE — Telephone Encounter (Signed)
PT's wife called and wants to know if his exelon patch can be taken back down to original, she thinks it's to much

## 2016-10-04 NOTE — Telephone Encounter (Signed)
OK to go back down to exelon 9.5mg , if he needs a new Rx, please send.

## 2016-10-08 ENCOUNTER — Other Ambulatory Visit: Payer: Self-pay | Admitting: Neurology

## 2016-10-08 NOTE — Telephone Encounter (Signed)
His wife called in the 9.5 MG instead of the 13.5. The pharmacy is wanting to verify the right MG for the Rivastigmine.  Please Advise. Thanks

## 2016-10-22 DIAGNOSIS — S0081XA Abrasion of other part of head, initial encounter: Secondary | ICD-10-CM | POA: Diagnosis not present

## 2016-12-11 DIAGNOSIS — Z23 Encounter for immunization: Secondary | ICD-10-CM | POA: Diagnosis not present

## 2017-02-21 ENCOUNTER — Ambulatory Visit
Admission: RE | Admit: 2017-02-21 | Discharge: 2017-02-21 | Disposition: A | Discharge status: Home / Self Care | Payer: Medicare Other | Source: Ambulatory Visit | Attending: Family Medicine | Admitting: Family Medicine

## 2017-02-21 ENCOUNTER — Other Ambulatory Visit: Payer: Self-pay | Admitting: Family Medicine

## 2017-02-21 DIAGNOSIS — I1310 Hypertensive heart and chronic kidney disease without heart failure, with stage 1 through stage 4 chronic kidney disease, or unspecified chronic kidney disease: Secondary | ICD-10-CM | POA: Diagnosis not present

## 2017-02-21 DIAGNOSIS — R05 Cough: Secondary | ICD-10-CM | POA: Diagnosis not present

## 2017-02-21 DIAGNOSIS — R059 Cough, unspecified: Secondary | ICD-10-CM

## 2017-02-21 DIAGNOSIS — N183 Chronic kidney disease, stage 3 (moderate): Secondary | ICD-10-CM | POA: Diagnosis not present

## 2017-02-21 DIAGNOSIS — Z0000 Encounter for general adult medical examination without abnormal findings: Secondary | ICD-10-CM | POA: Diagnosis not present

## 2017-03-20 DIAGNOSIS — H01009 Unspecified blepharitis unspecified eye, unspecified eyelid: Secondary | ICD-10-CM | POA: Diagnosis not present

## 2017-04-10 ENCOUNTER — Other Ambulatory Visit: Payer: Self-pay | Admitting: Neurology

## 2017-06-04 DIAGNOSIS — R972 Elevated prostate specific antigen [PSA]: Secondary | ICD-10-CM | POA: Diagnosis not present

## 2017-07-26 ENCOUNTER — Encounter: Payer: Self-pay | Admitting: Neurology

## 2017-07-26 ENCOUNTER — Ambulatory Visit: Payer: Medicare Other | Admitting: Neurology

## 2017-07-26 VITALS — BP 142/72 | HR 90 | Ht 69.75 in | Wt 190.0 lb

## 2017-07-26 DIAGNOSIS — G301 Alzheimer's disease with late onset: Secondary | ICD-10-CM | POA: Diagnosis not present

## 2017-07-26 DIAGNOSIS — F0280 Dementia in other diseases classified elsewhere without behavioral disturbance: Secondary | ICD-10-CM | POA: Diagnosis not present

## 2017-07-26 MED ORDER — RIVASTIGMINE TARTRATE 1.5 MG PO CAPS: ORAL_CAPSULE | ORAL | 0 refills | Status: DC

## 2017-07-26 NOTE — Patient Instructions (Signed)
1.  We will start rivastigmine 1.5mg  capsule.  Take 1 capsule twice daily for 14 days, then increase to 2 capsules twice daily.  Contact us for refill and I will increase dose if tolerated. 2.  Only drive with your wife riding with you 3.  Follow up in one year or as needed.

## 2017-07-26 NOTE — Progress Notes (Signed)
NEUROLOGY FOLLOW UP OFFICE NOTE  Shaun Moore [id number]  HISTORY OF PRESENT ILLNESS: Shaun Moore is a 84 year old left-handed man with hypertension, hypothyroidism, CAD, and hypercholesterolemia who follows up for cognitive impairment.  He is accompanied by his wife who supplements recent history.   UPDATE: He was taking Exelon 9.5mg  patch every 24 hours.  He was unable to tolerate 13.3mg  dosing.  However, he stopped taking it about 3 or 4 months ago because the cost increased from around $20 to $100 to $200. Short-term memory has gotten a little worse as per his wife.  He easily forgets conversations.  He still drives.  His wife hasn't noticed any problems.  He only drives alone when he fills up the gas tank or sees the audiologist.  He frequently needs to be reminded by his wife to take his medication.  He is a little more anxious and is often concerned about the way he looks, such as his clothes.  He was never bothered by this in the past.  No hallucinations, episodes of confusion or agitation. He is still driving and has not had any problems.   HISTORY: For about 4 years, he has had short-term memory problems, which have gradually progressed.  He frequently repeats himself, mostly stories about the past.  He will often repeat questions, too.  Sometimes, he will walk into a room and forget what he needed.  When he meets a new person, he will quickly forget their name.  He has no problems remembering names or recognizing acquaintances or family.  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  He loved his job and lived to work.  He has no family history of dementia.  He has no personal history of head trauma or stroke.  He went to school up until the 9th grade.  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   CT of head from 10/29/14 was unremarkable MRI of brain without contrast from 02/28/16 showed some atrophy and chronic small vessel  disease. B12 from 07/27/15 was 345 and methymalonic acid level was 253.   He didn't tolerate Aricept.  PAST MEDICAL HISTORY: Past Medical History:  Diagnosis Date  . Back pain   . BPH (benign prostatic hypertrophy)   . CAD (coronary artery disease)   . Cognitive impairment    dementia  . Esophageal reflux   . Essential hypertension, benign   . Headache    "pressure or heaviness in head"  . NSTEMI (non-ST elevated myocardial infarction) (HCC)   . OA (osteoarthritis)   . Pure hyperglyceridemia   . Rhinitis, allergic   . Vertigo     MEDICATIONS: Current Outpatient Medications on File Prior to Visit  Medication Sig Dispense Refill  . aspirin 81 MG tablet Take 81 mg by mouth daily.    . finasteride (PROSCAR) 5 MG tablet Take 5 mg by mouth daily.   2  . fish oil-omega-3 fatty acids 1000 MG capsule Take 2 g by mouth daily.    Marland Kitchen levothyroxine (SYNTHROID, LEVOTHROID) 25 MCG tablet Take 25 mcg by mouth daily before breakfast.   0  . losartan (COZAAR) 50 MG tablet Take 50 mg by mouth daily.    . rivastigmine (EXELON) 13.3 MG/24HR Place 1 patch (13.3 mg total) onto the skin daily. 30 patch 2  . rivastigmine (EXELON) 9.5 mg/24hr APPLY ONE PATCH onto SKIN DAILY 30 patch 5  . tamsulosin (FLOMAX) 0.4 MG  CAPS Take 0.4 mg by mouth daily.     No current facility-administered medications on file prior to visit.     ALLERGIES: Allergies  Allergen Reactions  . Ambien [Zolpidem Tartrate]     Stated that is "made him crazy"  . Lisinopril Cough    FAMILY HISTORY: Family History  Problem Relation Age of Onset  . CAD Father   . Hypertension Sister   . Cancer Sister        renal cell     SOCIAL HISTORY: Social History   Socioeconomic History  . Marital status: Married    Spouse name: Not on file  . Number of children: Not on file  . Years of education: Not on file  . Highest education level: Not on file  Occupational History  . Not on file  Social Needs  . Financial resource  strain: Not on file  . Food insecurity:    Worry: Not on file    Inability: Not on file  . Transportation needs:    Medical: Not on file    Non-medical: Not on file  Tobacco Use  . Smoking status: Never Smoker  . Smokeless tobacco: Never Used  Substance and Sexual Activity  . Alcohol use: No    Alcohol/week: 0.0 oz  . Drug use: No  . Sexual activity: Yes    Partners: Female  Lifestyle  . Physical activity:    Days per week: Not on file    Minutes per session: Not on file  . Stress: Not on file  Relationships  . Social connections:    Talks on phone: Not on file    Gets together: Not on file    Attends religious service: Not on file    Active member of club or organization: Not on file    Attends meetings of clubs or organizations: Not on file    Relationship status: Not on file  . Intimate partner violence:    Fear of current or ex partner: Not on file    Emotionally abused: Not on file    Physically abused: Not on file    Forced sexual activity: Not on file  Other Topics Concern  . Not on file  Social History Narrative  . Not on file    REVIEW OF SYSTEMS: Constitutional: No fevers, chills, or sweats, no generalized fatigue, change in appetite Eyes: No visual changes, double vision, eye pain Ear, nose and throat: No hearing loss, ear pain, nasal congestion, sore throat Cardiovascular: No chest pain, palpitations Respiratory:  No shortness of breath at rest or with exertion, wheezes GastrointestinaI: No nausea, vomiting, diarrhea, abdominal pain, fecal incontinence Genitourinary:  No dysuria, urinary retention or frequency Musculoskeletal:  No neck pain, back pain Integumentary: No rash, pruritus, skin lesions Neurological: as above Psychiatric: No depression, insomnia, anxiety Endocrine: No palpitations, fatigue, diaphoresis, mood swings, change in appetite, change in weight, increased thirst Hematologic/Lymphatic:  No purpura, petechiae. Allergic/Immunologic: no  itchy/runny eyes, nasal congestion, recent allergic reactions, rashes  PHYSICAL EXAM: Vitals:   07/26/17 1116  BP: (!) 142/72  Pulse: 90  SpO2: 94%   General: No acute distress.  Patient appears well-groomed.   Head:  Normocephalic/atraumatic Eyes:  Fundi examined but not visualized Neck: supple, no paraspinal tenderness, full range of motion Heart:  Regular rate and rhythm Lungs:  Clear to auscultation bilaterally Back: No paraspinal tenderness Neurological Exam: alert and oriented to person, place, day of week and year only. Attention span and concentration reduced, recent memory  poor, remote memory intact, fund of knowledge intact.  Speech fluent and not dysarthric, language intact.  Unable to complete Trail Making Test or copy cube correctly.  Able to draw clock to corrected time. MMSE - Mini Mental State Exam 07/26/2017 07/26/2016  Orientation to time 2 4  Orientation to Place 4 3  Registration 3 2  Attention/ Calculation 2 4  Recall 0 2  Language- name 2 objects 2 2  Language- repeat 0 0  Language- follow 3 step command 3 2  Language- read & follow direction 1 1  Write a sentence 0 0  Copy design 0 0  Total score 17 20   CN II-XII intact. Bulk and tone normal, muscle strength 5/5 throughout.  Sensation to light touch  intact.  Deep tendon reflexes 2+ throughout.  Finger to nose testing intact.  Gait normal, Romberg negative.  IMPRESSION: I think he has progressed to Alzheimer's dementia  PLAN: 1.  For affordability, we will start oral rivastigmine, 1.5mg  twice daily for 2 weeks, then increase to  twice daily 2.  Limit driving only with his wife in the vehicle 3.  Information and resources regarding Alzheimer's disease presented to patient and his wife. 4.  Follow up in one year or as needed.  28 minutes spent face to face with patient, over 50% spent discussing management.  Shon Millet, DO  CC:  Dr. Clelia Croft

## 2017-07-30 ENCOUNTER — Telehealth: Payer: Self-pay

## 2017-07-30 NOTE — Telephone Encounter (Signed)
Rcvd call from [facility], spoke with Vernona Rieger. Exelon Rx for caps instead of patches was sent to [facility] incorrectly. Vernona Rieger took verbal for 1.5 mg 1BID x14 days, then 2BID thereafter

## 2017-08-08 DIAGNOSIS — H00025 Hordeolum internum left lower eyelid: Secondary | ICD-10-CM | POA: Diagnosis not present

## 2017-08-17 ENCOUNTER — Emergency Department (HOSPITAL_COMMUNITY): Payer: Medicare Other

## 2017-08-17 ENCOUNTER — Other Ambulatory Visit: Payer: Self-pay

## 2017-08-17 ENCOUNTER — Observation Stay (HOSPITAL_COMMUNITY)
Admission: EM | Admit: 2017-08-17 | Discharge: 2017-08-18 | Disposition: A | Discharge status: Home Health | Payer: Medicare Other | Attending: Family Medicine | Admitting: Family Medicine

## 2017-08-17 ENCOUNTER — Encounter (HOSPITAL_COMMUNITY): Payer: Self-pay

## 2017-08-17 DIAGNOSIS — E7800 Pure hypercholesterolemia, unspecified: Secondary | ICD-10-CM | POA: Insufficient documentation

## 2017-08-17 DIAGNOSIS — R262 Difficulty in walking, not elsewhere classified: Secondary | ICD-10-CM | POA: Insufficient documentation

## 2017-08-17 DIAGNOSIS — Z888 Allergy status to other drugs, medicaments and biological substances status: Secondary | ICD-10-CM | POA: Diagnosis not present

## 2017-08-17 DIAGNOSIS — Z79899 Other long term (current) drug therapy: Secondary | ICD-10-CM | POA: Insufficient documentation

## 2017-08-17 DIAGNOSIS — Z7982 Long term (current) use of aspirin: Secondary | ICD-10-CM | POA: Insufficient documentation

## 2017-08-17 DIAGNOSIS — F0280 Dementia in other diseases classified elsewhere without behavioral disturbance: Secondary | ICD-10-CM | POA: Diagnosis not present

## 2017-08-17 DIAGNOSIS — J9690 Respiratory failure, unspecified, unspecified whether with hypoxia or hypercapnia: Secondary | ICD-10-CM | POA: Diagnosis not present

## 2017-08-17 DIAGNOSIS — N400 Enlarged prostate without lower urinary tract symptoms: Secondary | ICD-10-CM | POA: Insufficient documentation

## 2017-08-17 DIAGNOSIS — I2510 Atherosclerotic heart disease of native coronary artery without angina pectoris: Secondary | ICD-10-CM | POA: Insufficient documentation

## 2017-08-17 DIAGNOSIS — G301 Alzheimer's disease with late onset: Secondary | ICD-10-CM | POA: Diagnosis not present

## 2017-08-17 DIAGNOSIS — R509 Fever, unspecified: Secondary | ICD-10-CM | POA: Diagnosis not present

## 2017-08-17 DIAGNOSIS — R0902 Hypoxemia: Secondary | ICD-10-CM | POA: Diagnosis not present

## 2017-08-17 DIAGNOSIS — I700 Atherosclerosis of aorta: Secondary | ICD-10-CM | POA: Diagnosis not present

## 2017-08-17 DIAGNOSIS — I4510 Unspecified right bundle-branch block: Secondary | ICD-10-CM | POA: Diagnosis not present

## 2017-08-17 DIAGNOSIS — M6281 Muscle weakness (generalized): Principal | ICD-10-CM | POA: Insufficient documentation

## 2017-08-17 DIAGNOSIS — I252 Old myocardial infarction: Secondary | ICD-10-CM | POA: Diagnosis not present

## 2017-08-17 DIAGNOSIS — R2681 Unsteadiness on feet: Secondary | ICD-10-CM | POA: Diagnosis not present

## 2017-08-17 DIAGNOSIS — W19XXXA Unspecified fall, initial encounter: Secondary | ICD-10-CM | POA: Diagnosis not present

## 2017-08-17 DIAGNOSIS — R531 Weakness: Secondary | ICD-10-CM

## 2017-08-17 DIAGNOSIS — I10 Essential (primary) hypertension: Secondary | ICD-10-CM | POA: Diagnosis not present

## 2017-08-17 LAB — CBC WITH DIFFERENTIAL/PLATELET
BASOS PCT: 0 %
Basophils Absolute: 0 10*3/uL (ref 0.0–0.1)
Eosinophils Absolute: 0 10*3/uL (ref 0.0–0.7)
Eosinophils Relative: 0 %
HEMATOCRIT: 44.2 % (ref 39.0–52.0)
HEMOGLOBIN: 14.9 g/dL (ref 13.0–17.0)
LYMPHS ABS: 0.9 10*3/uL (ref 0.7–4.0)
Lymphocytes Relative: 8 %
MCH: 32.1 pg (ref 26.0–34.0)
MCHC: 33.7 g/dL (ref 30.0–36.0)
MCV: 95.3 fL (ref 78.0–100.0)
MONOS PCT: 13 %
Monocytes Absolute: 1.4 10*3/uL — ABNORMAL HIGH (ref 0.1–1.0)
NEUTROS PCT: 79 %
Neutro Abs: 8.6 10*3/uL — ABNORMAL HIGH (ref 1.7–7.7)
Platelets: 237 10*3/uL (ref 150–400)
RBC: 4.64 MIL/uL (ref 4.22–5.81)
RDW: 13.5 % (ref 11.5–15.5)
WBC: 10.9 10*3/uL — AB (ref 4.0–10.5)

## 2017-08-17 LAB — COMPREHENSIVE METABOLIC PANEL
ALT: 16 U/L — ABNORMAL LOW (ref 17–63)
ANION GAP: 9 (ref 5–15)
AST: 23 U/L (ref 15–41)
Albumin: 3.5 g/dL (ref 3.5–5.0)
Alkaline Phosphatase: 108 U/L (ref 38–126)
BUN: 17 mg/dL (ref 6–20)
CALCIUM: 8.8 mg/dL — AB (ref 8.9–10.3)
CO2: 25 mmol/L (ref 22–32)
Chloride: 102 mmol/L (ref 101–111)
Creatinine, Ser: 1.37 mg/dL — ABNORMAL HIGH (ref 0.61–1.24)
GFR, EST AFRICAN AMERICAN: 51 mL/min — AB (ref >60)
GFR, EST NON AFRICAN AMERICAN: 44 mL/min — AB (ref >60)
Glucose, Bld: 146 mg/dL — ABNORMAL HIGH (ref 65–99)
Potassium: 4.1 mmol/L (ref 3.5–5.1)
SODIUM: 136 mmol/L (ref 135–145)
Total Bilirubin: 1.6 mg/dL — ABNORMAL HIGH (ref 0.3–1.2)
Total Protein: 6.6 g/dL (ref 6.5–8.1)

## 2017-08-17 LAB — URINALYSIS, ROUTINE W REFLEX MICROSCOPIC
BILIRUBIN URINE: NEGATIVE
Glucose, UA: NEGATIVE mg/dL
Hgb urine dipstick: NEGATIVE
KETONES UR: NEGATIVE mg/dL
LEUKOCYTES UA: NEGATIVE
NITRITE: NEGATIVE
PH: 6 (ref 5.0–8.0)
PROTEIN: NEGATIVE mg/dL
Specific Gravity, Urine: 1.006 (ref 1.005–1.030)

## 2017-08-17 LAB — I-STAT TROPONIN, ED: TROPONIN I, POC: 0.01 ng/mL (ref 0.00–0.08)

## 2017-08-17 LAB — I-STAT CG4 LACTIC ACID, ED: Lactic Acid, Venous: 1.69 mmol/L (ref 0.5–1.9)

## 2017-08-17 MED ORDER — LOSARTAN POTASSIUM 50 MG PO TABS
50.0000 mg | ORAL_TABLET | Freq: Every day | ORAL | Status: DC
  Administered 2017-08-18: 50 mg via ORAL
  Filled 2017-08-17: qty 1

## 2017-08-17 MED ORDER — SODIUM CHLORIDE 0.9 % IV BOLUS
500.0000 mL | Freq: Once | INTRAVENOUS | Status: AC
  Administered 2017-08-17: 500 mL via INTRAVENOUS

## 2017-08-17 MED ORDER — ONDANSETRON HCL 4 MG/2ML IJ SOLN: 4.0000 mg | Freq: Four times a day (QID) | INTRAMUSCULAR | Status: DC | PRN

## 2017-08-17 MED ORDER — FINASTERIDE 5 MG PO TABS
5.0000 mg | ORAL_TABLET | Freq: Every day | ORAL | Status: DC
  Filled 2017-08-17: qty 1

## 2017-08-17 MED ORDER — CEPHALEXIN 500 MG PO CAPS
500.0000 mg | ORAL_CAPSULE | Freq: Two times a day (BID) | ORAL | Status: DC
  Administered 2017-08-18: 500 mg via ORAL
  Filled 2017-08-17: qty 1

## 2017-08-17 MED ORDER — ACETAMINOPHEN 325 MG PO TABS: 650.0000 mg | ORAL_TABLET | Freq: Four times a day (QID) | ORAL | Status: DC | PRN

## 2017-08-17 MED ORDER — NEOMYCIN-POLYMYXIN-DEXAMETH 3.5-10000-0.1 OP OINT
1.0000 "application " | TOPICAL_OINTMENT | Freq: Three times a day (TID) | OPHTHALMIC | Status: DC
  Administered 2017-08-18: 1 via OPHTHALMIC
  Filled 2017-08-17: qty 3.5

## 2017-08-17 MED ORDER — ASPIRIN EC 81 MG PO TBEC
81.0000 mg | DELAYED_RELEASE_TABLET | Freq: Every day | ORAL | Status: DC
  Administered 2017-08-18: 81 mg via ORAL
  Filled 2017-08-17: qty 1

## 2017-08-17 MED ORDER — RIVASTIGMINE TARTRATE 1.5 MG PO CAPS
3.0000 mg | ORAL_CAPSULE | Freq: Two times a day (BID) | ORAL | Status: DC
  Administered 2017-08-18: 3 mg via ORAL
  Filled 2017-08-17 (×2): qty 2

## 2017-08-17 MED ORDER — ACETAMINOPHEN 650 MG RE SUPP: 650.0000 mg | Freq: Four times a day (QID) | RECTAL | Status: DC | PRN

## 2017-08-17 MED ORDER — LEVOTHYROXINE SODIUM 25 MCG PO TABS
25.0000 ug | ORAL_TABLET | Freq: Every day | ORAL | Status: DC
  Administered 2017-08-18: 25 ug via ORAL
  Filled 2017-08-17: qty 1

## 2017-08-17 MED ORDER — ONDANSETRON HCL 4 MG PO TABS: 4.0000 mg | ORAL_TABLET | Freq: Four times a day (QID) | ORAL | Status: DC | PRN

## 2017-08-17 MED ORDER — ACETAMINOPHEN 500 MG PO TABS
1000.0000 mg | ORAL_TABLET | Freq: Once | ORAL | Status: AC
  Administered 2017-08-17: 1000 mg via ORAL
  Filled 2017-08-17: qty 2

## 2017-08-17 MED ORDER — OMEGA-3-ACID ETHYL ESTERS 1 G PO CAPS
1.0000 g | ORAL_CAPSULE | Freq: Every day | ORAL | Status: DC
  Administered 2017-08-18: 1 g via ORAL
  Filled 2017-08-17: qty 1

## 2017-08-17 MED ORDER — ENOXAPARIN SODIUM 40 MG/0.4ML ~~LOC~~ SOLN
40.0000 mg | Freq: Every day | SUBCUTANEOUS | Status: DC
  Administered 2017-08-18: 40 mg via SUBCUTANEOUS
  Filled 2017-08-17: qty 0.4

## 2017-08-17 MED ORDER — TAMSULOSIN HCL 0.4 MG PO CAPS
0.4000 mg | ORAL_CAPSULE | Freq: Every day | ORAL | Status: DC
  Filled 2017-08-17: qty 1

## 2017-08-17 MED ORDER — OLANZAPINE 5 MG PO TBDP
5.0000 mg | ORAL_TABLET | Freq: Once | ORAL | Status: AC
  Administered 2017-08-18: 5 mg via ORAL
  Filled 2017-08-17: qty 1

## 2017-08-17 NOTE — ED Notes (Signed)
Bed: [id number] Expected date:  Expected time:  Means of arrival:  Comments: 84 yo gen weakness

## 2017-08-17 NOTE — H&P (Signed)
History and Physical    Shaun Moore ZOX:[id number] DOB: May 08, 1925 DOA: 08/17/2017  PCP: Lupita Raider, MD  Patient coming from: Home  I have personally briefly reviewed patient's old medical records in [facility]  Chief Complaint: Fever, Generalized weakness  HPI: Shaun Moore is a 84 y.o. male with medical history significant of alzheimers, BPH, CAD.  Patient presents to the ED with 2 day h/o generalized weakness, subjective fever.  Has been on Keflex and ABx ointment for L eye infection.  No headache, no neck stiffness, no sore throat.  Just developed non-productive cough since being in ED.  No increased SOB over baseline, no abd pain, no N/V/D, no dysuria, no rashes or ulcers.  No leg swelling, no recent travel nor history of blood clots.   ED Course: Tm 101.1.  WBC 10.9k.  Lactate nl.  CXR and CT chest nl.  UA nl.  Trop neg.  CT head nothing acute.  Creat 1.3 which is likely baseline.  T.bili 1.6 (also seems chronic).     Review of Systems: As per HPI otherwise 10 point review of systems negative.   Past Medical History:  Diagnosis Date  . Back pain   . BPH (benign prostatic hypertrophy)   . CAD (coronary artery disease)   . Cognitive impairment    dementia  . Esophageal reflux   . Essential hypertension, benign   . Headache    "pressure or heaviness in head"  . NSTEMI (non-ST elevated myocardial infarction) (HCC)   . OA (osteoarthritis)   . Pure hyperglyceridemia   . Rhinitis, allergic   . Vertigo     Past Surgical History:  Procedure Laterality Date  . CARDIAC CATHETERIZATION     DE stent RCA and LAD     reports that he has never smoked. He has never used smokeless tobacco. He reports that he does not drink alcohol or use drugs.  Allergies  Allergen Reactions  . Ambien [Zolpidem Tartrate]     Stated that is "made him crazy"  . Lisinopril Cough    Family History  Problem Relation Age of Onset  . CAD Father   . Hypertension Sister   . Cancer  Sister        renal cell      Prior to Admission medications   Medication Sig Start Date End Date Taking? Authorizing Provider  aspirin 81 MG tablet Take 81 mg by mouth daily.   Yes [provider]  cephALEXin (KEFLEX) 500 MG capsule Take 500 mg by mouth 2 (two) times daily. 08/08/17  Yes [provider]  finasteride (PROSCAR) 5 MG tablet Take 5 mg by mouth daily.  09/28/14  Yes [provider]  fish oil-omega-3 fatty acids 1000 MG capsule Take 2 g by mouth daily.   Yes [provider]  levothyroxine (SYNTHROID, LEVOTHROID) 25 MCG tablet Take 25 mcg by mouth daily before breakfast.  09/27/14  Yes [provider]  losartan (COZAAR) 50 MG tablet Take 50 mg by mouth daily.   Yes [provider]  rivastigmine (EXELON) 1.5 MG capsule Take 1 capsule twice daily for 14 days, then increase to 2 capsules twice daily 07/26/17  Yes Jaffe, Adam R, DO  tamsulosin (FLOMAX) 0.4 MG CAPS Take 0.4 mg by mouth daily.   Yes [provider]  neomycin-polymyxin b-dexamethasone (MAXITROL) 3.5-10000-0.1 OINT Place 1 application into the left eye 3 (three) times daily. 08/14/17   [provider]    Physical Exam: Vitals:  08/17/17 2006 08/17/17 2007 08/17/17 2158 08/17/17 2200  BP:  (!) 151/79  (!) 144/94  Pulse: 85 87  85  Resp: (!) 22 (!) 22  (!) 26  Temp:   (!) 97.5 F (36.4 C)   TempSrc:   Oral   SpO2: 97% 97%  91%    Constitutional: NAD, calm, comfortable Eyes: PERRL, conjunctivae normal, few "bumps" under L lower eye lid, no erythema, no drainage, no pain with eye movement, no dysconjugate gaze. ENMT: Mucous membranes are moist. Posterior pharynx clear of any exudate or lesions.Normal dentition.  Neck: normal, supple, no masses, no thyromegaly Respiratory: clear to auscultation bilaterally, no wheezing, no crackles. Normal respiratory effort. No accessory muscle use.  Cardiovascular: Regular rate and rhythm, no murmurs / rubs /  gallops. No extremity edema. 2+ pedal pulses. No carotid bruits.  Abdomen: no tenderness, no masses palpated. No hepatosplenomegaly. Bowel sounds positive.  Musculoskeletal: no clubbing / cyanosis. No joint deformity upper and lower extremities. Good ROM, no contractures. Normal muscle tone.  Skin: no rashes, lesions, ulcers. No induration Neurologic: CN 2-12 grossly intact. Sensation intact, DTR normal. Strength 5/5 in all 4.  Psychiatric: Alert, demented as per baseline.   Labs on Admission: I have personally reviewed following labs and imaging studies  CBC: Recent Labs  Lab 08/17/17 1750  WBC 10.9*  NEUTROABS 8.6*  HGB 14.9  HCT 44.2  MCV 95.3  PLT 237   Basic Metabolic Panel: Recent Labs  Lab 08/17/17 1750  NA 136  K 4.1  CL 102  CO2 25  GLUCOSE 146*  BUN 17  CREATININE 1.37*  CALCIUM 8.8*   GFR: CrCl cannot be calculated (Unknown ideal weight.). Liver Function Tests: Recent Labs  Lab 08/17/17 1750  AST 23  ALT 16*  ALKPHOS 108  BILITOT 1.6*  PROT 6.6  ALBUMIN 3.5   No results for input(s): LIPASE, AMYLASE in the last 168 hours. No results for input(s): AMMONIA in the last 168 hours. Coagulation Profile: No results for input(s): INR, PROTIME in the last 168 hours. Cardiac Enzymes: No results for input(s): CKTOTAL, CKMB, CKMBINDEX, TROPONINI in the last 168 hours. BNP (last 3 results) No results for input(s): PROBNP in the last 8760 hours. HbA1C: No results for input(s): HGBA1C in the last 72 hours. CBG: No results for input(s): GLUCAP in the last 168 hours. Lipid Profile: No results for input(s): CHOL, HDL, LDLCALC, TRIG, CHOLHDL, LDLDIRECT in the last 72 hours. Thyroid Function Tests: No results for input(s): TSH, T4TOTAL, FREET4, T3FREE, THYROIDAB in the last 72 hours. Anemia Panel: No results for input(s): VITAMINB12, FOLATE, FERRITIN, TIBC, IRON, RETICCTPCT in the last 72 hours. Urine analysis:    Component Value Date/Time   COLORURINE STRAW  (A) 08/17/2017 1750   APPEARANCEUR CLEAR 08/17/2017 1750   LABSPEC 1.006 08/17/2017 1750   PHURINE 6.0 08/17/2017 1750   GLUCOSEU NEGATIVE 08/17/2017 1750   HGBUR NEGATIVE 08/17/2017 1750   BILIRUBINUR NEGATIVE 08/17/2017 1750   KETONESUR NEGATIVE 08/17/2017 1750   PROTEINUR NEGATIVE 08/17/2017 1750   UROBILINOGEN 0.2 11/09/2006 2014   NITRITE NEGATIVE 08/17/2017 1750   LEUKOCYTESUR NEGATIVE 08/17/2017 1750    Radiological Exams on Admission: Dg Chest 2 View  Result Date: 08/17/2017 CLINICAL DATA:  Patient arrives via EMS c/o generalized weakness x2 days, warm to touch. Patient on an antibiotic for eye infection. Patient had fall this morning due to weakness, no injuries noted. Hx dementia EXAM: CHEST - 2 VIEW COMPARISON:  None. FINDINGS: Normal cardiac silhouette. Chronic elevation  RIGHT hemidiaphragm. No effusion, infiltrate pneumothorax. IMPRESSION: No acute cardiopulmonary findings. Atherosclerotic calcification of the aorta. Chronic elevation of the RIGHT hemidiaphragm. Electronically Signed   By: Genevive Bi M.D.   On: 08/17/2017 19:13   Ct Head Wo Contrast  Result Date: 08/17/2017 CLINICAL DATA:  Generalized weakness for 2 days, warm to touch. Fall this morning due to weakness. EXAM: CT HEAD WITHOUT CONTRAST TECHNIQUE: Contiguous axial images were obtained from the base of the skull through the vertex without intravenous contrast. COMPARISON:  Head CT dated 10/29/2014.  Brain MRI dated 02/28/2016. FINDINGS: Brain: Ventricles are stable in size and configuration. There is no mass, hemorrhage, edema or other evidence of acute parenchymal abnormality. No extra-axial hemorrhage. Vascular: There are chronic calcified atherosclerotic changes of the large vessels at the skull base. No unexpected hyperdense vessel. Skull: Normal. Negative for fracture or focal lesion. Sinuses/Orbits: No acute finding. Other: None. IMPRESSION: No acute findings.  No intracranial mass, hemorrhage or edema.  Electronically Signed   By: Bary Richard M.D.   On: 08/17/2017 18:18   Ct Chest Wo Contrast  Result Date: 08/17/2017 CLINICAL DATA:  Acute respiratory illness, generalized weakness with fever EXAM: CT CHEST WITHOUT CONTRAST TECHNIQUE: Multidetector CT imaging of the chest was performed following the standard protocol without IV contrast. COMPARISON:  Chest x-ray 02/28/2016, CT chest 02/28/2016 FINDINGS: Cardiovascular: Limited evaluation without intravenous contrast. No aneurysmal dilatation. Moderate aortic atherosclerosis. Coronary vascular calcification. Normal heart size. No pericardial effusion Mediastinum/Nodes: No enlarged mediastinal or axillary lymph nodes. Thyroid gland, trachea, and esophagus demonstrate no significant findings. Lungs/Pleura: No acute pulmonary infiltrate or effusion. No pneumothorax. Scattered calcified granuloma. Linear branching calcifications in the right lung as before. Upper Abdomen: No acute abnormality. Partially visualized cyst mid to upper pole left kidney Musculoskeletal: No acute or suspicious abnormality. IMPRESSION: 1. Negative for acute pulmonary or pleural effusion. 2. No acute abnormality seen. Aortic Atherosclerosis (ICD10-I70.0). Electronically Signed   By: Jasmine Pang M.D.   On: 08/17/2017 20:55    EKG: Independently reviewed.  Assessment/Plan Principal Problem:   Fever Active Problems:   Essential hypertension, benign   Late onset Alzheimer's disease without behavioral disturbance   Generalized weakness    1. Fever and generalized weakness - 1. Viral URI? 2. Checking resp virus panel 3. BCx pending 4. Tylenol PRN fever 5. Will leave him on the Keflex and ABx ointment for eye for the moment. 6. Work up for other source neg thus far. 7. Checking pro-calcitonin 8. Checking repeat labs in AM. 9. Holding off on further escalation of ABx for now 2. Alzheimers 1. Cont home meds 2. Getting Zyprexa in ED by EDP for start of  sundowning 3. Expect worsening delirium / sundowning during admit, treat as needed. 4. Given BPH and fall risk, may want to do condom catheter or bedside urinal. 3. HTN - 1. Cont home losartan  DVT prophylaxis: Lovenox Code Status: Full Family Communication: Family at bedside Disposition Plan: Home after admit Consults called: None Admission status: Place in [place], [place]. DO [facility] Pager [phone] Only works nights!  If 7AM-7PM, please contact the primary day team physician taking care of patient  [email or web address] Password [email or web address]  08/17/2017, 11:00 PM

## 2017-08-17 NOTE — ED Triage Notes (Signed)
Patient arrives via EMS c/o generalized weakness x2 days, warm to touch. Patient on an antibiotic for eye infection. Patient had fall this morning due to weakness, no injuries noted. Hx dementia, AxOx2.   12 lead RBBB  Temp 99.5 BP: 150/90 P: 92 RR: 18 SPO2: 95%  BGL: 170  18G Left forearm IV.

## 2017-08-18 DIAGNOSIS — I10 Essential (primary) hypertension: Secondary | ICD-10-CM | POA: Diagnosis not present

## 2017-08-18 DIAGNOSIS — R509 Fever, unspecified: Secondary | ICD-10-CM

## 2017-08-18 DIAGNOSIS — R531 Weakness: Secondary | ICD-10-CM | POA: Diagnosis not present

## 2017-08-18 LAB — CBC
HCT: 42.3 % (ref 39.0–52.0)
HEMOGLOBIN: 14 g/dL (ref 13.0–17.0)
MCH: 31.5 pg (ref 26.0–34.0)
MCHC: 33.1 g/dL (ref 30.0–36.0)
MCV: 95.3 fL (ref 78.0–100.0)
Platelets: 210 10*3/uL (ref 150–400)
RBC: 4.44 MIL/uL (ref 4.22–5.81)
RDW: 13.6 % (ref 11.5–15.5)
WBC: 8.6 10*3/uL (ref 4.0–10.5)

## 2017-08-18 LAB — RESPIRATORY PANEL BY PCR
ADENOVIRUS-RVPPCR: NOT DETECTED
BORDETELLA PERTUSSIS-RVPCR: NOT DETECTED
CORONAVIRUS 229E-RVPPCR: NOT DETECTED
CORONAVIRUS HKU1-RVPPCR: NOT DETECTED
CORONAVIRUS NL63-RVPPCR: NOT DETECTED
Chlamydophila pneumoniae: NOT DETECTED
Coronavirus OC43: NOT DETECTED
Influenza A: NOT DETECTED
Influenza B: NOT DETECTED
METAPNEUMOVIRUS-RVPPCR: NOT DETECTED
Mycoplasma pneumoniae: NOT DETECTED
PARAINFLUENZA VIRUS 1-RVPPCR: NOT DETECTED
PARAINFLUENZA VIRUS 2-RVPPCR: NOT DETECTED
Parainfluenza Virus 3: NOT DETECTED
Parainfluenza Virus 4: NOT DETECTED
Respiratory Syncytial Virus: NOT DETECTED
Rhinovirus / Enterovirus: NOT DETECTED

## 2017-08-18 LAB — COMPREHENSIVE METABOLIC PANEL
ALBUMIN: 3.2 g/dL — AB (ref 3.5–5.0)
ALK PHOS: 93 U/L (ref 38–126)
ALT: 15 U/L — ABNORMAL LOW (ref 17–63)
AST: 19 U/L (ref 15–41)
Anion gap: 8 (ref 5–15)
BUN: 17 mg/dL (ref 6–20)
CALCIUM: 8.6 mg/dL — AB (ref 8.9–10.3)
CO2: 28 mmol/L (ref 22–32)
CREATININE: 1.39 mg/dL — AB (ref 0.61–1.24)
Chloride: 104 mmol/L (ref 101–111)
GFR calc Af Amer: 50 mL/min — ABNORMAL LOW (ref >60)
GFR calc non Af Amer: 43 mL/min — ABNORMAL LOW (ref >60)
GLUCOSE: 125 mg/dL — AB (ref 65–99)
Potassium: 4.1 mmol/L (ref 3.5–5.1)
SODIUM: 140 mmol/L (ref 135–145)
Total Bilirubin: 1.3 mg/dL — ABNORMAL HIGH (ref 0.3–1.2)
Total Protein: 6.3 g/dL — ABNORMAL LOW (ref 6.5–8.1)

## 2017-08-18 LAB — PROCALCITONIN: PROCALCITONIN: 0.48 ng/mL

## 2017-08-18 NOTE — ED Provider Notes (Signed)
[facility] GENERAL SURGERY Provider Note   CSN: [id number] Arrival date & time: 08/17/17  1739     History   Chief Complaint Chief Complaint  Patient presents with  . Weakness    HPI Shaun Moore is a 84 y.o. male.  84 year old male with past medical history including dementia, CAD, hypertension, BPH who presents with weakness.  Patient was brought in by EMS for generalized weakness that has been progressively worsening over the past 2 days.  He has felt warm to the touch but family was not aware of any fevers, their thermometer at home is broken.  He had a procedure on his outer eyelids last week and has been on an antibiotic ointment.  They report that his eyes have been looking better recently.  He had a fall this morning because of his generalized weakness but did not hit his head or lose consciousness.  He denies any injuries from the fall.  He denies any urinary symptoms, abdominal pain, cough, chest pain, diarrhea, vomiting, or sick contacts.  No recent travel.  Family notes that he seemed to be breathing heavier today but patient denies any significant breathing issues.  LEVEL 5 CAVEAT DUE TO DEMENTIA  The history is provided by the patient.  Weakness     Past Medical History:  Diagnosis Date  . Back pain   . BPH (benign prostatic hypertrophy)   . CAD (coronary artery disease)   . Cognitive impairment    dementia  . Esophageal reflux   . Essential hypertension, benign   . Headache    "pressure or heaviness in head"  . NSTEMI (non-ST elevated myocardial infarction) (HCC)   . OA (osteoarthritis)   . Pure hyperglyceridemia   . Rhinitis, allergic   . Vertigo     Patient Active Problem List   Diagnosis Date Noted  . Fever 08/17/2017  . Generalized weakness 08/17/2017  . Late onset Alzheimer's disease without behavioral disturbance 10/25/2014  . Coronary artery disease involving native coronary artery of native heart without angina  pectoris 11/02/2013  . Pure hypercholesterolemia 11/02/2013  . Daytime somnolence 11/02/2013  . Essential hypertension, benign 11/02/2013    Past Surgical History:  Procedure Laterality Date  . CARDIAC CATHETERIZATION     DE stent RCA and LAD        Home Medications    Prior to Admission medications   Medication Sig Start Date End Date Taking? Authorizing Provider  aspirin 81 MG tablet Take 81 mg by mouth daily.   Yes [provider]  cephALEXin (KEFLEX) 500 MG capsule Take 500 mg by mouth 2 (two) times daily. 08/08/17  Yes [provider]  finasteride (PROSCAR) 5 MG tablet Take 5 mg by mouth daily.  09/28/14  Yes [provider]  fish oil-omega-3 fatty acids 1000 MG capsule Take 2 g by mouth daily.   Yes [provider]  levothyroxine (SYNTHROID, LEVOTHROID) 25 MCG tablet Take 25 mcg by mouth daily before breakfast.  09/27/14  Yes [provider]  losartan (COZAAR) 50 MG tablet Take 50 mg by mouth daily.   Yes [provider]  rivastigmine (EXELON) 1.5 MG capsule Take 1 capsule twice daily for 14 days, then increase to 2 capsules twice daily 07/26/17  Yes Jaffe, Adam R, DO  tamsulosin (FLOMAX) 0.4 MG CAPS Take 0.4 mg by mouth daily.   Yes [provider]  neomycin-polymyxin b-dexamethasone (MAXITROL) 3.5-10000-0.1 OINT Place 1 application into the left eye 3 (three)  times daily. 08/14/17   [provider]    Family History Family History  Problem Relation Age of Onset  . CAD Father   . Hypertension Sister   . Cancer Sister        renal cell     Social History Social History   Tobacco Use  . Smoking status: Never Smoker  . Smokeless tobacco: Never Used  Substance Use Topics  . Alcohol use: No    Alcohol/week: 0.0 oz  . Drug use: No     Allergies   Ambien [zolpidem tartrate] and Lisinopril   Review of Systems Review of Systems  Unable to perform ROS: Dementia  Neurological: Positive for  weakness.     Physical Exam Updated Vital Signs BP (!) 143/78 (BP Location: Right Arm)   Pulse 79   Temp (!) 97.5 F (36.4 C) (Oral)   Resp 20   SpO2 97%   Physical Exam  Constitutional: He appears well-developed and well-nourished. No distress.  HENT:  Head: Normocephalic and atraumatic.  Moist mucous membranes  Eyes: Pupils are equal, round, and reactive to light. Conjunctivae are normal.  Neck: Neck supple.  Cardiovascular: Normal rate, regular rhythm and normal heart sounds.  No murmur heard. Pulmonary/Chest: Effort normal.  Diminished b/l  Abdominal: Soft. Bowel sounds are normal. He exhibits no distension. There is no tenderness.  Musculoskeletal: He exhibits edema (trace BLE).  Neurological: He is alert.  Fluent speech, hard of hearing, following commands but repetitive questioning  Skin: Skin is warm and dry.  Psychiatric: He has a normal mood and affect.  Nursing note and vitals reviewed.    ED Treatments / Results  Labs (all labs ordered are listed, but only abnormal results are displayed) Labs Reviewed  COMPREHENSIVE METABOLIC PANEL - Abnormal; Notable for the following components:      Result Value   Glucose, Bld 146 (*)    Creatinine, Ser 1.37 (*)    Calcium 8.8 (*)    ALT 16 (*)    Total Bilirubin 1.6 (*)    GFR calc non Af Amer 44 (*)    GFR calc Af Amer 51 (*)    All other components within normal limits  CBC WITH DIFFERENTIAL/PLATELET - Abnormal; Notable for the following components:   WBC 10.9 (*)    Neutro Abs 8.6 (*)    Monocytes Absolute 1.4 (*)    All other components within normal limits  URINALYSIS, ROUTINE W REFLEX MICROSCOPIC - Abnormal; Notable for the following components:   Color, Urine STRAW (*)    All other components within normal limits  RESPIRATORY PANEL BY PCR  CULTURE, BLOOD (ROUTINE X 2)  CULTURE, BLOOD (ROUTINE X 2)  PROCALCITONIN  PROCALCITONIN  CBC  COMPREHENSIVE METABOLIC PANEL  I-STAT TROPONIN, ED  I-STAT CG4  LACTIC ACID, ED    EKG EKG Interpretation  Date/Time:  Saturday August 17 2017 17:51:40 EDT Ventricular Rate:  88 PR Interval:    QRS Duration: 140 QT Interval:  371 QTC Calculation: 449 R Axis:   97 Text Interpretation:  Sinus rhythm RBBB and LPFB Inferior infarct, old Lateral leads are also involved No significant change since last tracing Confirmed by Frederick PeersLittle, Rachel [phone]([id number]) on 08/17/2017 6:41:19 PM   Radiology Dg Chest 2 View  Result Date: 08/17/2017 CLINICAL DATA:  Patient arrives via EMS c/o generalized weakness x2 days, warm to touch. Patient on an antibiotic for eye infection. Patient had fall this morning due to weakness, no injuries noted. Hx dementia EXAM:  CHEST - 2 VIEW COMPARISON:  None. FINDINGS: Normal cardiac silhouette. Chronic elevation RIGHT hemidiaphragm. No effusion, infiltrate pneumothorax. IMPRESSION: No acute cardiopulmonary findings. Atherosclerotic calcification of the aorta. Chronic elevation of the RIGHT hemidiaphragm. Electronically Signed   By: Genevive Bi M.D.   On: 08/17/2017 19:13   Ct Head Wo Contrast  Result Date: 08/17/2017 CLINICAL DATA:  Generalized weakness for 2 days, warm to touch. Fall this morning due to weakness. EXAM: CT HEAD WITHOUT CONTRAST TECHNIQUE: Contiguous axial images were obtained from the base of the skull through the vertex without intravenous contrast. COMPARISON:  Head CT dated 10/29/2014.  Brain MRI dated 02/28/2016. FINDINGS: Brain: Ventricles are stable in size and configuration. There is no mass, hemorrhage, edema or other evidence of acute parenchymal abnormality. No extra-axial hemorrhage. Vascular: There are chronic calcified atherosclerotic changes of the large vessels at the skull base. No unexpected hyperdense vessel. Skull: Normal. Negative for fracture or focal lesion. Sinuses/Orbits: No acute finding. Other: None. IMPRESSION: No acute findings.  No intracranial mass, hemorrhage or edema. Electronically Signed   By: Bary Richard M.D.   On: 08/17/2017 18:18   Ct Chest Wo Contrast  Result Date: 08/17/2017 CLINICAL DATA:  Acute respiratory illness, generalized weakness with fever EXAM: CT CHEST WITHOUT CONTRAST TECHNIQUE: Multidetector CT imaging of the chest was performed following the standard protocol without IV contrast. COMPARISON:  Chest x-ray 02/28/2016, CT chest 02/28/2016 FINDINGS: Cardiovascular: Limited evaluation without intravenous contrast. No aneurysmal dilatation. Moderate aortic atherosclerosis. Coronary vascular calcification. Normal heart size. No pericardial effusion Mediastinum/Nodes: No enlarged mediastinal or axillary lymph nodes. Thyroid gland, trachea, and esophagus demonstrate no significant findings. Lungs/Pleura: No acute pulmonary infiltrate or effusion. No pneumothorax. Scattered calcified granuloma. Linear branching calcifications in the right lung as before. Upper Abdomen: No acute abnormality. Partially visualized cyst mid to upper pole left kidney Musculoskeletal: No acute or suspicious abnormality. IMPRESSION: 1. Negative for acute pulmonary or pleural effusion. 2. No acute abnormality seen. Aortic Atherosclerosis (ICD10-I70.0). Electronically Signed   By: Jasmine Pang M.D.   On: 08/17/2017 20:55    Procedures Procedures (including critical care time)  Medications Ordered in ED Medications  OLANZapine zydis (ZYPREXA) disintegrating tablet 5 mg (has no administration in time range)  cephALEXin (KEFLEX) capsule 500 mg (has no administration in time range)  aspirin EC tablet 81 mg (has no administration in time range)  finasteride (PROSCAR) tablet 5 mg (has no administration in time range)  levothyroxine (SYNTHROID, LEVOTHROID) tablet 25 mcg (has no administration in time range)  losartan (COZAAR) tablet 50 mg (has no administration in time range)  neomycin-polymyxin b-dexamethasone (MAXITROL) ophthalmic ointment 1 application (has no administration in time range)  rivastigmine  (EXELON) capsule 3 mg (has no administration in time range)  tamsulosin (FLOMAX) capsule 0.4 mg (has no administration in time range)  omega-3 acid ethyl esters (LOVAZA) capsule 1 g (has no administration in time range)  acetaminophen (TYLENOL) tablet 650 mg (has no administration in time range)    Or  acetaminophen (TYLENOL) suppository 650 mg (has no administration in time range)  ondansetron (ZOFRAN) tablet 4 mg (has no administration in time range)    Or  ondansetron (ZOFRAN) injection 4 mg (has no administration in time range)  enoxaparin (LOVENOX) injection 40 mg (has no administration in time range)  acetaminophen (TYLENOL) tablet 1,000 mg (1,000 mg Oral Given 08/17/17 2007)  sodium chloride 0.9 % bolus 500 mL (0 mLs Intravenous Stopped 08/17/17 2108)     Initial Impression / Assessment  and Plan / ED Course  I have reviewed the triage vital signs and the nursing notes.  Pertinent labs & imaging results that were available during my care of the patient were reviewed by me and considered in my medical decision making (see chart for details).    Patient was febrile at 101.1 on arrival, remainder vital signs reassuring.  She saturation mid 90s on room air with normal work of breathing.  He had no focal abdominal tenderness.  He denies any complaints other than weakness.  Lactate normal.  Given the rest of vital signs reassuring, held on antibiotics.  Lab work shows creatinine 1.37 similar to previous, reassuring CMP, WBC 10.9, UA without evidence of infection.  Chest x-ray showed no acute findings.  Obtained noncontrasted CT of chest to rule out occult infiltrate given the patient's nonspecific symptoms and family to report that he was breathing heavier.  Chest CT without acute findings to explain his fever.  Family reports that he has been confused more than usual since his onset of symptoms and he has had repetitive questioning here for me.  I recommended admission for supportive measures  and to observe for clinical improvement.  He shows no delirium, meningismus, or other concerning features to suggest bacterial meningitis therefore I do not feel LP is warranted at this time.  A respiratory viral panel is pending.  His head CT was negative acute.  I discussed admission with [facility] hospitalist, Dr. Julian Reil, and pt admitted for further treatment. Final Clinical Impressions(s) / ED Diagnoses   Final diagnoses:  None    ED Discharge Orders    None       Little, Ambrose Finland, MD 08/18/17 [phone]

## 2017-08-18 NOTE — Progress Notes (Signed)
D/C instructions were given to spouse.  All questions answered. Pt d/cd home.

## 2017-08-18 NOTE — ED Notes (Signed)
ED TO INPATIENT HANDOFF REPORT  Name/Age/Gender Shaun Moore 84 y.o. male  Code Status    Code Status Orders  (From admission, onward)        Start     Ordered   08/17/17 2230  Full code  Continuous     08/17/17 2232    Code Status History    This patient has a current code status but no historical code status.      Home/SNF/Other Home  Chief Complaint weakness  Level of Care/Admitting Diagnosis ED Disposition    ED Disposition Condition [facility] Area: [facility] [[id number]]  Level of Care: Med-Surg [16]  Diagnosis: Fever [637858]  Admitting Physician: Etta Quill [[id number]]  Attending Physician: Etta Quill [[id number]]  PT Class (Do Not Modify): Observation [104]  PT Acc Code (Do Not Modify): Observation [10022]       Medical History Past Medical History:  Diagnosis Date  . Back pain   . BPH (benign prostatic hypertrophy)   . CAD (coronary artery disease)   . Cognitive impairment    dementia  . Esophageal reflux   . Essential hypertension, benign   . Headache    "pressure or heaviness in head"  . NSTEMI (non-ST elevated myocardial infarction) ([place])   . OA (osteoarthritis)   . Pure hyperglyceridemia   . Rhinitis, allergic   . Vertigo     Allergies Allergies  Allergen Reactions  . Ambien [Zolpidem Tartrate]     Stated that is "made him crazy"  . Lisinopril Cough    IV Location/Drains/Wounds Patient Lines/Drains/Airways Status   Active Line/Drains/Airways    Name:   Placement date:   Placement time:   Site:   Days:   Peripheral IV 08/17/17 Left Forearm   08/17/17    1757    Forearm   1          Labs/Imaging Results for orders placed or performed during the hospital encounter of 08/17/17 (from the past 48 hour(s))  Comprehensive metabolic panel     Status: Abnormal   Collection Time: 08/17/17  5:50 PM  Result Value Ref Range   Sodium 136 135 - 145 mmol/L   Potassium 4.1 3.5 - 5.1 mmol/L   Chloride  102 101 - 111 mmol/L   CO2 25 22 - 32 mmol/L   Glucose, Bld 146 (H) 65 - 99 mg/dL   BUN 17 6 - 20 mg/dL   Creatinine, Ser 1.37 (H) 0.61 - 1.24 mg/dL   Calcium 8.8 (L) 8.9 - 10.3 mg/dL   Total Protein 6.6 6.5 - 8.1 g/dL   Albumin 3.5 3.5 - 5.0 g/dL   AST 23 15 - 41 U/L   ALT 16 (L) 17 - 63 U/L   Alkaline Phosphatase 108 38 - 126 U/L   Total Bilirubin 1.6 (H) 0.3 - 1.2 mg/dL   GFR calc non Af Amer 44 (L) >60 mL/min   GFR calc Af Amer 51 (L) >60 mL/min    Comment: (NOTE) The eGFR has been calculated using the CKD EPI equation. This calculation has not been validated in all clinical situations. eGFR's persistently <60 mL/min signify possible Chronic Kidney Disease.    Anion gap 9 5 - 15    Comment: Performed at [facility], [place] [address], [place], [place] [address]  CBC with Differential     Status: Abnormal   Collection Time: 08/17/17  5:50 PM  Result Value Ref Range  WBC 10.9 (H) 4.0 - 10.5 K/uL   RBC 4.64 4.22 - 5.81 MIL/uL   Hemoglobin 14.9 13.0 - 17.0 g/dL   HCT 44.2 39.0 - 52.0 %   MCV 95.3 78.0 - 100.0 fL   MCH 32.1 26.0 - 34.0 pg   MCHC 33.7 30.0 - 36.0 g/dL   RDW 13.5 11.5 - 15.5 %   Platelets 237 150 - 400 K/uL   Neutrophils Relative % 79 %   Neutro Abs 8.6 (H) 1.7 - 7.7 K/uL   Lymphocytes Relative 8 %   Lymphs Abs 0.9 0.7 - 4.0 K/uL   Monocytes Relative 13 %   Monocytes Absolute 1.4 (H) 0.1 - 1.0 K/uL   Eosinophils Relative 0 %   Eosinophils Absolute 0.0 0.0 - 0.7 K/uL   Basophils Relative 0 %   Basophils Absolute 0.0 0.0 - 0.1 K/uL    Comment: Performed at [facility], [place] [address], [place], [place] [address]  Urinalysis, Routine w reflex microscopic     Status: Abnormal   Collection Time: 08/17/17  5:50 PM  Result Value Ref Range   Color, Urine STRAW (A) YELLOW   APPearance CLEAR CLEAR   Specific Gravity, Urine 1.006 1.005 - 1.030   pH 6.0 5.0 - 8.0   Glucose, UA NEGATIVE NEGATIVE mg/dL   Hgb urine dipstick  NEGATIVE NEGATIVE   Bilirubin Urine NEGATIVE NEGATIVE   Ketones, ur NEGATIVE NEGATIVE mg/dL   Protein, ur NEGATIVE NEGATIVE mg/dL   Nitrite NEGATIVE NEGATIVE   Leukocytes, UA NEGATIVE NEGATIVE    Comment: Performed at [place] [address] [place], [place] [address]  I-stat troponin, ED     Status: None   Collection Time: 08/17/17  6:09 PM  Result Value Ref Range   Troponin i, poc 0.01 0.00 - 0.08 ng/mL   Comment 3            Comment: Due to the release kinetics of cTnI, a negative result within the first hours of the onset of symptoms does not rule out myocardial infarction with certainty. If myocardial infarction is still suspected, repeat the test at appropriate intervals.   I-Stat CG4 Lactic Acid, ED     Status: None   Collection Time: 08/17/17  6:11 PM  Result Value Ref Range   Lactic Acid, Venous 1.69 0.5 - 1.9 mmol/L   Dg Chest 2 View  Result Date: 08/17/2017 CLINICAL DATA:  Patient arrives via EMS c/o generalized weakness x2 days, warm to touch. Patient on an antibiotic for eye infection. Patient had fall this morning due to weakness, no injuries noted. Hx dementia EXAM: CHEST - 2 VIEW COMPARISON:  None. FINDINGS: Normal cardiac silhouette. Chronic elevation RIGHT hemidiaphragm. No effusion, infiltrate pneumothorax. IMPRESSION: No acute cardiopulmonary findings. Atherosclerotic calcification of the aorta. Chronic elevation of the RIGHT hemidiaphragm. Electronically Signed   By: Suzy Bouchard M.D.   On: 08/17/2017 19:13   Ct Head Wo Contrast  Result Date: 08/17/2017 CLINICAL DATA:  Generalized weakness for 2 days, warm to touch. Fall this morning due to weakness. EXAM: CT HEAD WITHOUT CONTRAST TECHNIQUE: Contiguous axial images were obtained from the base of the skull through the vertex without intravenous contrast. COMPARISON:  Head CT dated 10/29/2014.  Brain MRI dated 02/28/2016. FINDINGS: Brain: Ventricles are stable in size and configuration.  There is no mass, hemorrhage, edema or other evidence of acute parenchymal abnormality. No extra-axial hemorrhage. Vascular: There are chronic calcified atherosclerotic changes of the large vessels at  the skull base. No unexpected hyperdense vessel. Skull: Normal. Negative for fracture or focal lesion. Sinuses/Orbits: No acute finding. Other: None. IMPRESSION: No acute findings.  No intracranial mass, hemorrhage or edema. Electronically Signed   By: Franki Cabot M.D.   On: 08/17/2017 18:18   Ct Chest Wo Contrast  Result Date: 08/17/2017 CLINICAL DATA:  Acute respiratory illness, generalized weakness with fever EXAM: CT CHEST WITHOUT CONTRAST TECHNIQUE: Multidetector CT imaging of the chest was performed following the standard protocol without IV contrast. COMPARISON:  Chest x-ray 02/28/2016, CT chest 02/28/2016 FINDINGS: Cardiovascular: Limited evaluation without intravenous contrast. No aneurysmal dilatation. Moderate aortic atherosclerosis. Coronary vascular calcification. Normal heart size. No pericardial effusion Mediastinum/Nodes: No enlarged mediastinal or axillary lymph nodes. Thyroid gland, trachea, and esophagus demonstrate no significant findings. Lungs/Pleura: No acute pulmonary infiltrate or effusion. No pneumothorax. Scattered calcified granuloma. Linear branching calcifications in the right lung as before. Upper Abdomen: No acute abnormality. Partially visualized cyst mid to upper pole left kidney Musculoskeletal: No acute or suspicious abnormality. IMPRESSION: 1. Negative for acute pulmonary or pleural effusion. 2. No acute abnormality seen. Aortic Atherosclerosis (ICD10-I70.0). Electronically Signed   By: Donavan Foil M.D.   On: 08/17/2017 20:55    Pending Labs Unresulted Labs (From admission, onward)   Start     Ordered   08/18/17 0500  Procalcitonin  Daily,   R     08/17/17 2228   08/18/17 0500  CBC  Tomorrow morning,   R     08/17/17 2232   08/18/17 0500  Comprehensive metabolic  panel  Tomorrow morning,   R     08/17/17 2232   08/17/17 2229  Procalcitonin - Baseline  STAT,   STAT     08/17/17 2228   08/17/17 2129  Culture, blood (routine x 2)  BLOOD CULTURE X 2,   STAT     08/17/17 2128   08/17/17 1843  Respiratory Panel by PCR  (Respiratory virus panel)  Once,   R     08/17/17 1842      Vitals/Pain Today's Vitals   08/17/17 2007 08/17/17 2158 08/17/17 2200 08/17/17 2300  BP: (!) 151/79  (!) 144/94 126/78  Pulse: 87  85 87  Resp: (!) 22  (!) 26 (!) 23  Temp:  (!) 97.5 F (36.4 C)    TempSrc:  Oral    SpO2: 97%  91% 91%  PainSc:        Isolation Precautions Droplet precaution  Medications Medications  OLANZapine zydis (ZYPREXA) disintegrating tablet 5 mg (has no administration in time range)  cephALEXin (KEFLEX) capsule 500 mg (has no administration in time range)  aspirin tablet 81 mg (has no administration in time range)  finasteride (PROSCAR) tablet 5 mg (has no administration in time range)  levothyroxine (SYNTHROID, LEVOTHROID) tablet 25 mcg (has no administration in time range)  losartan (COZAAR) tablet 50 mg (has no administration in time range)  neomycin-polymyxin b-dexamethasone (MAXITROL) ophthalmic ointment 1 application (has no administration in time range)  rivastigmine (EXELON) capsule 3 mg (has no administration in time range)  tamsulosin (FLOMAX) capsule 0.4 mg (has no administration in time range)  fish oil-omega-3 fatty acids capsule 2 g (has no administration in time range)  acetaminophen (TYLENOL) tablet 650 mg (has no administration in time range)    Or  acetaminophen (TYLENOL) suppository 650 mg (has no administration in time range)  ondansetron (ZOFRAN) tablet 4 mg (has no administration in time range)    Or  ondansetron (ZOFRAN) injection  4 mg (has no administration in time range)  enoxaparin (LOVENOX) injection 40 mg (has no administration in time range)  acetaminophen (TYLENOL) tablet 1,000 mg (1,000 mg Oral Given  08/17/17 2007)  sodium chloride 0.9 % bolus 500 mL (0 mLs Intravenous Stopped 08/17/17 2108)    Mobility walks with person assist due to being weak

## 2017-08-18 NOTE — Progress Notes (Signed)
Pt received via  Stretcher from ER, accompanied by spouse, grand daughter and grand son in Social [place]. Oriented to room, TV control, call light and light controls. Needs refreshments frequently on use of items. Placed in room closed to nurses' station, bed alarms applied. Hearing aids placed in denture container by patients spouse and left in room. Patient in good spirits,  No acute distress noted.

## 2017-08-18 NOTE — Evaluation (Signed)
Physical Therapy Evaluation Patient Details Name: Shaun Moore MRN: [id number] DOB: March 02, 1926 Today's Date: 08/18/2017   History of Present Illness  84 yo male admitted with fever, weakness, fall. Hx of Alz dementia, hearing loss, vertigo, NSTEMI, OA  Clinical Impression  On eval, pt required Min assist for mobility. He walked ~100 feet in the hallway- RW vs no device. Pt presents with general weakness, decreased activity tolerance, and impaired gait and balance. Pt tolerated activity well. No c/o dizziness. Discussed d/c plan with family-plan is for pt to return home with family. They are agreeable to HHPT f/u. Recommend RW use for safe ambulation until pt returns to baseline.     Follow Up Recommendations Home health PT;Supervision/Assistance - 24 hour    Equipment Recommendations  None recommended by PT    Recommendations for Other Services       Precautions / Restrictions Precautions Precautions: Fall Restrictions Weight Bearing Restrictions: No      Mobility  Bed Mobility Overal bed mobility: Needs Assistance Bed Mobility: Supine to Sit     Supine to sit: Supervision     General bed mobility comments: for safety. cues for pt to perform task unassisted.   Transfers Overall transfer level: Needs assistance Equipment used: Rolling walker (2 wheeled) Transfers: Sit to/from Stand Sit to Stand: Supervision         General transfer comment: vcs safety, hand placement. cues for pt to perform task unassisted.  Ambulation/Gait Ambulation/Gait assistance: Min guard; Min assist Gait Distance (Feet): 100 Feet Assistive device: Rolling walker (2 wheeled);None Gait Pattern/deviations: Step-through pattern;Drifts right/left;Staggering right;Staggering left     General Gait Details: walked 50 feet with RW then walked another 50 feet without any assistive device. close guard for safety with RW use. small amount of assist to steady without device. Pt tolerated distance well.  no c/o lightheadedness/dizziness.   Stairs            Wheelchair Mobility    Modified Rankin (Stroke Patients Only)       Balance Overall balance assessment: Needs assistance;History of Falls           Standing balance-[name] Scale: Fair                               Pertinent Vitals/Pain Pain Assessment: No/denies pain    Home Living Family/patient expects to be discharged to:: Private residence Living Arrangements: Spouse/significant other Available Help at Discharge: Family Type of Home: House Home Access: Stairs to enter Entrance Stairs-Rails: None Secretary/administratorntrance Stairs-Number of Steps: 1 Home Layout: One level Home Equipment: Environmental consultantWalker - 2 wheels      Prior Function Level of Independence: Independent               Hand Dominance        Extremity/Trunk Assessment   Upper Extremity Assessment Upper Extremity Assessment: Overall WFL for tasks assessed         Cervical / Trunk Assessment Cervical / Trunk Assessment: Normal  Communication   Communication: HOH  Cognition Arousal/Alertness: Awake/alert Behavior During Therapy: WFL for tasks assessed/performed Overall Cognitive Status: History of cognitive impairments - at baseline                                        General Comments      Exercises     Assessment/Plan  PT Assessment Patient needs continued PT services  PT Problem List Decreased strength;Decreased balance;Decreased mobility;Decreased activity tolerance;Decreased cognition;Decreased knowledge of use of DME       PT Treatment Interventions DME instruction;Gait training;Functional mobility training;Therapeutic activities;Balance training;Patient/family education;Therapeutic exercise    PT Goals (Current goals can be found in the Care Plan section)  Acute Rehab PT Goals Patient Stated Goal: none stated PT Goal Formulation: With patient/family Time For Goal Achievement: 09/01/17 Potential to  Achieve Goals: Good    Frequency Min 3X/week   Barriers to discharge        Co-evaluation               AM-PAC PT "6 Clicks" Daily Activity  Outcome Measure Difficulty turning over in bed (including adjusting bedclothes, sheets and blankets)?: A Little Difficulty moving from lying on back to sitting on the side of the bed? : A Little Difficulty sitting down on and standing up from a chair with arms (e.g., wheelchair, bedside commode, etc,.)?: A Little Help needed moving to and from a bed to chair (including a wheelchair)?: A Little Help needed walking in hospital room?: A Little Help needed climbing 3-5 steps with a railing? : A Little 6 Click Score: 18    End of Session Equipment Utilized During Treatment: Gait belt Activity Tolerance: Patient tolerated treatment well Patient left: in bed;with call bell/phone within reach;with family/visitor present   PT Visit Diagnosis: Unsteadiness on feet (R26.81);Muscle weakness (generalized) (M62.81);Difficulty in walking, not elsewhere classified (R26.2)    Time: 1610-9604 PT Time Calculation (min) (ACUTE ONLY): 14 min   Charges:   PT Evaluation $PT Eval Moderate Complexity: 1 Mod     PT G Codes:          Rebeca Alert, MPT Pager: [phone]

## 2017-08-18 NOTE — Care Management Note (Addendum)
Case Management Note  Patient Details  Name: Shaun Moore MRN: [id number] Date of Birth: Jul 24, 1925  Subjective/Objective:  Fever, generalized weakness                  Action/Plan:  Lives at home with wife. NCM spoke to wife and granddtr at bedside. Offered choice for HH/list provided. Wife requested [facility]. He had them in the past. Has RW at home. Contacted [facility] with new referral. Accepted referral.   Expected Discharge Date:  08/18/17               Expected Discharge Plan:  Home Health  In-House Referral:  CM Consult  Discharge planning Services  Home Health   Post Acute Care Choice: Home Health   HH Arranged:  PT, OT  [facility]  Status of Service:  complete  If discussed at Long Length of Stay Meetings, dates discussed:    Additional Comments:  Shaun Moore, Shaun Zollner Ellen, RN 08/18/2017, 1:12 PM

## 2017-08-18 NOTE — Discharge Summary (Signed)
Physician Discharge Summary  Shaun Moore ZHY:[id number] DOB: 09/25/1925 DOA: 08/17/2017  PCP: Lupita Raider, MD  Admit date: 08/17/2017 Discharge date: 08/18/2017  Admitted From: Home Disposition: Home  Recommendations for Outpatient Follow-up:  1. Follow up with PCP in 1 week 2. Please obtain BMP/CBC in one week 3. Please follow up on the following pending results: Blood cultures  Home Health: PT/OT Equipment/Devices: None  Discharge Condition: Stable CODE STATUS: Full code Diet recommendation: Heart healthy   Brief/Interim Summary:  Admission HPI written by Hillary Bow, DO   Chief Complaint: Fever, Generalized weakness  HPI: Shaun Moore is a 84 y.o. male with medical history significant of alzheimers, BPH, CAD.  Patient presents to the ED with 2 day h/o generalized weakness, subjective fever.  Has been on Keflex and ABx ointment for L eye infection.  No headache, no neck stiffness, no sore throat.  Just developed non-productive cough since being in ED.  No increased SOB over baseline, no abd pain, no N/V/D, no dysuria, no rashes or ulcers.  No leg swelling, no recent travel nor history of blood clots.   ED Course: Tm 101.1.  WBC 10.9k.  Lactate nl.  CXR and CT chest nl.  UA nl.  Trop neg.  CT head nothing acute.  Creat 1.3 which is likely baseline.  T.bili 1.6 (also seems chronic).   Hospital course:  Fever Associate weakness. RVP negative. Urinalysis unremarkable. Blood culture no growth at <12 hours. Chest CT unremarkable. No recurrent fever overnight. No evidence of infection and no symptoms other than the weakness. Patient is stable for discharge home. PT recommended home health, which was ordered. Blood cultures still pending.  Dementia Continued home medications. Received Zyprexa, which caused him to be very sleepy. Will not continue on discharge.  Essential hypertension Continued losartan.  Discharge Diagnoses:  Principal Problem:    Fever Active Problems:   Essential hypertension, benign   Late onset Alzheimer's disease without behavioral disturbance   Generalized weakness    Discharge Instructions  Discharge Instructions    Diet - low sodium heart healthy   Complete by:  As directed    Increase activity slowly   Complete by:  As directed      Allergies as of 08/18/2017      Reactions   Ambien [zolpidem Tartrate]    Stated that is "made him crazy"   Lisinopril Cough      Medication List    TAKE these medications   aspirin 81 MG tablet Take 81 mg by mouth daily.   cephALEXin 500 MG capsule Commonly known as:  KEFLEX Take 500 mg by mouth 2 (two) times daily.   finasteride 5 MG tablet Commonly known as:  PROSCAR Take 5 mg by mouth daily.   fish oil-omega-3 fatty acids 1000 MG capsule Take 2 g by mouth daily.   levothyroxine 25 MCG tablet Commonly known as:  SYNTHROID, LEVOTHROID Take 25 mcg by mouth daily before breakfast.   losartan 50 MG tablet Commonly known as:  COZAAR Take 50 mg by mouth daily.   neomycin-polymyxin b-dexamethasone 3.5-10000-0.1 Oint Commonly known as:  MAXITROL Place 1 application into the left eye 3 (three) times daily.   rivastigmine 1.5 MG capsule Commonly known as:  EXELON Take 1 capsule twice daily for 14 days, then increase to 2 capsules twice daily   tamsulosin 0.4 MG Caps capsule Commonly known as:  FLOMAX Take 0.4 mg by mouth daily.       Allergies  Allergen Reactions  . Ambien [Zolpidem Tartrate]     Stated that is "made him crazy"  . Lisinopril Cough    Consultations:  None   Procedures/Studies: Dg Chest 2 View  Result Date: 08/17/2017 CLINICAL DATA:  Patient arrives via EMS c/o generalized weakness x2 days, warm to touch. Patient on an antibiotic for eye infection. Patient had fall this morning due to weakness, no injuries noted. Hx dementia EXAM: CHEST - 2 VIEW COMPARISON:  None. FINDINGS: Normal cardiac silhouette. Chronic elevation  RIGHT hemidiaphragm. No effusion, infiltrate pneumothorax. IMPRESSION: No acute cardiopulmonary findings. Atherosclerotic calcification of the aorta. Chronic elevation of the RIGHT hemidiaphragm. Electronically Signed   By: Genevive Bi M.D.   On: 08/17/2017 19:13   Ct Head Wo Contrast  Result Date: 08/17/2017 CLINICAL DATA:  Generalized weakness for 2 days, warm to touch. Fall this morning due to weakness. EXAM: CT HEAD WITHOUT CONTRAST TECHNIQUE: Contiguous axial images were obtained from the base of the skull through the vertex without intravenous contrast. COMPARISON:  Head CT dated 10/29/2014.  Brain MRI dated 02/28/2016. FINDINGS: Brain: Ventricles are stable in size and configuration. There is no mass, hemorrhage, edema or other evidence of acute parenchymal abnormality. No extra-axial hemorrhage. Vascular: There are chronic calcified atherosclerotic changes of the large vessels at the skull base. No unexpected hyperdense vessel. Skull: Normal. Negative for fracture or focal lesion. Sinuses/Orbits: No acute finding. Other: None. IMPRESSION: No acute findings.  No intracranial mass, hemorrhage or edema. Electronically Signed   By: Bary Richard M.D.   On: 08/17/2017 18:18   Ct Chest Wo Contrast  Result Date: 08/17/2017 CLINICAL DATA:  Acute respiratory illness, generalized weakness with fever EXAM: CT CHEST WITHOUT CONTRAST TECHNIQUE: Multidetector CT imaging of the chest was performed following the standard protocol without IV contrast. COMPARISON:  Chest x-ray 02/28/2016, CT chest 02/28/2016 FINDINGS: Cardiovascular: Limited evaluation without intravenous contrast. No aneurysmal dilatation. Moderate aortic atherosclerosis. Coronary vascular calcification. Normal heart size. No pericardial effusion Mediastinum/Nodes: No enlarged mediastinal or axillary lymph nodes. Thyroid gland, trachea, and esophagus demonstrate no significant findings. Lungs/Pleura: No acute pulmonary infiltrate or effusion. No  pneumothorax. Scattered calcified granuloma. Linear branching calcifications in the right lung as before. Upper Abdomen: No acute abnormality. Partially visualized cyst mid to upper pole left kidney Musculoskeletal: No acute or suspicious abnormality. IMPRESSION: 1. Negative for acute pulmonary or pleural effusion. 2. No acute abnormality seen. Aortic Atherosclerosis (ICD10-I70.0). Electronically Signed   By: Jasmine Pang M.D.   On: 08/17/2017 20:55      Subjective: No issues today  Discharge Exam: Vitals:   08/18/17 0536 08/18/17 1222  BP: 134/85 122/71  Pulse: 79 89  Resp: 20 16  Temp: 98.1 F (36.7 C) 98.2 F (36.8 C)  SpO2: 90% 95%   Vitals:   08/17/17 2300 08/18/17 0029 08/18/17 0536 08/18/17 1222  BP: 126/78 (!) 143/78 134/85 122/71  Pulse: 87 79 79 89  Resp: (!) 23 20 20 16   Temp:   98.1 F (36.7 C) 98.2 F (36.8 C)  TempSrc:   Oral Axillary  SpO2: 91% 97% 90% 95%    General: Pt is alert, awake, not in acute distress Cardiovascular: RRR, S1/S2 +, no rubs, no gallops Respiratory: CTA bilaterally, no wheezing, no rhonchi Abdominal: Soft, NT, ND, bowel sounds + Extremities: no edema, no cyanosis Neuro: Alert, oriented to person and place    The results of significant diagnostics from this hospitalization (including imaging, microbiology, ancillary and laboratory) are listed below for reference.  Microbiology: Recent Results (from the past 240 hour(s))  Respiratory Panel by PCR     Status: None   Collection Time: 08/17/17  8:11 PM  Result Value Ref Range Status   Adenovirus NOT DETECTED NOT DETECTED Final   Coronavirus 229E NOT DETECTED NOT DETECTED Final   Coronavirus HKU1 NOT DETECTED NOT DETECTED Final   Coronavirus NL63 NOT DETECTED NOT DETECTED Final   Coronavirus OC43 NOT DETECTED NOT DETECTED Final   Metapneumovirus NOT DETECTED NOT DETECTED Final   Rhinovirus / Enterovirus NOT DETECTED NOT DETECTED Final   Influenza A NOT DETECTED NOT DETECTED  Final   Influenza B NOT DETECTED NOT DETECTED Final   Parainfluenza Virus 1 NOT DETECTED NOT DETECTED Final   Parainfluenza Virus 2 NOT DETECTED NOT DETECTED Final   Parainfluenza Virus 3 NOT DETECTED NOT DETECTED Final   Parainfluenza Virus 4 NOT DETECTED NOT DETECTED Final   Respiratory Syncytial Virus NOT DETECTED NOT DETECTED Final   Bordetella pertussis NOT DETECTED NOT DETECTED Final   Chlamydophila pneumoniae NOT DETECTED NOT DETECTED Final   Mycoplasma pneumoniae NOT DETECTED NOT DETECTED Final    Comment: Performed at [facility], [address], [place], [place] [address]     Labs: BNP (last 3 results) No results for input(s): BNP in the last 8760 hours. Basic Metabolic Panel: Recent Labs  Lab 08/17/17 1750 08/18/17 0356  NA 136 140  K 4.1 4.1  CL 102 104  CO2 25 28  GLUCOSE 146* 125*  BUN 17 17  CREATININE 1.37* 1.39*  CALCIUM 8.8* 8.6*   Liver Function Tests: Recent Labs  Lab 08/17/17 1750 08/18/17 0356  AST 23 19  ALT 16* 15*  ALKPHOS 108 93  BILITOT 1.6* 1.3*  PROT 6.6 6.3*  ALBUMIN 3.5 3.2*   No results for input(s): LIPASE, AMYLASE in the last 168 hours. No results for input(s): AMMONIA in the last 168 hours. CBC: Recent Labs  Lab 08/17/17 1750 08/18/17 0356  WBC 10.9* 8.6  NEUTROABS 8.6*  --   HGB 14.9 14.0  HCT 44.2 42.3  MCV 95.3 95.3  PLT 237 210   Urinalysis    Component Value Date/Time   COLORURINE STRAW (A) 08/17/2017 1750   APPEARANCEUR CLEAR 08/17/2017 1750   LABSPEC 1.006 08/17/2017 1750   PHURINE 6.0 08/17/2017 1750   GLUCOSEU NEGATIVE 08/17/2017 1750   HGBUR NEGATIVE 08/17/2017 1750   BILIRUBINUR NEGATIVE 08/17/2017 1750   KETONESUR NEGATIVE 08/17/2017 1750   PROTEINUR NEGATIVE 08/17/2017 1750   UROBILINOGEN 0.2 11/09/2006 2014   NITRITE NEGATIVE 08/17/2017 1750   LEUKOCYTESUR NEGATIVE 08/17/2017 1750   Sepsis Labs Invalid input(s): PROCALCITONIN,  WBC,  LACTICIDVEN Microbiology Recent Results (from the past  240 hour(s))  Respiratory Panel by PCR     Status: None   Collection Time: 08/17/17  8:11 PM  Result Value Ref Range Status   Adenovirus NOT DETECTED NOT DETECTED Final   Coronavirus 229E NOT DETECTED NOT DETECTED Final   Coronavirus HKU1 NOT DETECTED NOT DETECTED Final   Coronavirus NL63 NOT DETECTED NOT DETECTED Final   Coronavirus OC43 NOT DETECTED NOT DETECTED Final   Metapneumovirus NOT DETECTED NOT DETECTED Final   Rhinovirus / Enterovirus NOT DETECTED NOT DETECTED Final   Influenza A NOT DETECTED NOT DETECTED Final   Influenza B NOT DETECTED NOT DETECTED Final   Parainfluenza Virus 1 NOT DETECTED NOT DETECTED Final   Parainfluenza Virus 2 NOT DETECTED NOT DETECTED Final   Parainfluenza Virus 3 NOT DETECTED NOT  DETECTED Final   Parainfluenza Virus 4 NOT DETECTED NOT DETECTED Final   Respiratory Syncytial Virus NOT DETECTED NOT DETECTED Final   Bordetella pertussis NOT DETECTED NOT DETECTED Final   Chlamydophila pneumoniae NOT DETECTED NOT DETECTED Final   Mycoplasma pneumoniae NOT DETECTED NOT DETECTED Final    Comment: Performed at [facility], [address] [place], [place] [address]    SIGNED:   Jacquelin Hawkingalph Leita Lindbloom, MD [facility] 08/18/2017, 12:49 PM

## 2017-08-19 DIAGNOSIS — E7800 Pure hypercholesterolemia, unspecified: Secondary | ICD-10-CM | POA: Diagnosis not present

## 2017-08-19 DIAGNOSIS — M1990 Unspecified osteoarthritis, unspecified site: Secondary | ICD-10-CM | POA: Diagnosis not present

## 2017-08-19 DIAGNOSIS — G301 Alzheimer's disease with late onset: Secondary | ICD-10-CM | POA: Diagnosis not present

## 2017-08-19 DIAGNOSIS — Z7982 Long term (current) use of aspirin: Secondary | ICD-10-CM | POA: Diagnosis not present

## 2017-08-19 DIAGNOSIS — K219 Gastro-esophageal reflux disease without esophagitis: Secondary | ICD-10-CM | POA: Diagnosis not present

## 2017-08-19 DIAGNOSIS — I2510 Atherosclerotic heart disease of native coronary artery without angina pectoris: Secondary | ICD-10-CM | POA: Diagnosis not present

## 2017-08-19 DIAGNOSIS — F0280 Dementia in other diseases classified elsewhere without behavioral disturbance: Secondary | ICD-10-CM | POA: Diagnosis not present

## 2017-08-19 DIAGNOSIS — N400 Enlarged prostate without lower urinary tract symptoms: Secondary | ICD-10-CM | POA: Diagnosis not present

## 2017-08-19 DIAGNOSIS — I10 Essential (primary) hypertension: Secondary | ICD-10-CM | POA: Diagnosis not present

## 2017-08-23 LAB — CULTURE, BLOOD (ROUTINE X 2)
Culture: NO GROWTH
Culture: NO GROWTH
SPECIAL REQUESTS: ADEQUATE
Special Requests: ADEQUATE

## 2017-09-03 DIAGNOSIS — E039 Hypothyroidism, unspecified: Secondary | ICD-10-CM | POA: Diagnosis not present

## 2017-09-03 DIAGNOSIS — I1310 Hypertensive heart and chronic kidney disease without heart failure, with stage 1 through stage 4 chronic kidney disease, or unspecified chronic kidney disease: Secondary | ICD-10-CM | POA: Diagnosis not present

## 2017-09-03 DIAGNOSIS — N183 Chronic kidney disease, stage 3 (moderate): Secondary | ICD-10-CM | POA: Diagnosis not present

## 2017-09-03 DIAGNOSIS — I25119 Atherosclerotic heart disease of native coronary artery with unspecified angina pectoris: Secondary | ICD-10-CM | POA: Diagnosis not present

## 2017-12-18 DIAGNOSIS — G4700 Insomnia, unspecified: Secondary | ICD-10-CM | POA: Diagnosis not present

## 2017-12-18 DIAGNOSIS — F322 Major depressive disorder, single episode, severe without psychotic features: Secondary | ICD-10-CM | POA: Diagnosis not present

## 2017-12-18 DIAGNOSIS — F0390 Unspecified dementia without behavioral disturbance: Secondary | ICD-10-CM | POA: Diagnosis not present

## 2017-12-18 DIAGNOSIS — E039 Hypothyroidism, unspecified: Secondary | ICD-10-CM | POA: Diagnosis not present

## 2017-12-18 DIAGNOSIS — R195 Other fecal abnormalities: Secondary | ICD-10-CM | POA: Diagnosis not present

## 2018-01-06 DIAGNOSIS — R945 Abnormal results of liver function studies: Secondary | ICD-10-CM | POA: Diagnosis not present

## 2018-03-07 ENCOUNTER — Telehealth: Payer: Self-pay | Admitting: Neurology

## 2018-03-07 MED ORDER — RIVASTIGMINE TARTRATE 1.5 MG PO CAPS: 3.0000 mg | ORAL_CAPSULE | Freq: Two times a day (BID) | ORAL | 3 refills | Status: DC

## 2018-03-07 NOTE — Telephone Encounter (Signed)
Patient's wife is calling in needing a refill for the RIVASTIGMINE CAPSULES 1.5MG - to the [facility] ON [address]. Thanks!

## 2018-03-07 NOTE — Telephone Encounter (Signed)
Rx sent in

## 2018-03-20 DIAGNOSIS — N183 Chronic kidney disease, stage 3 (moderate): Secondary | ICD-10-CM | POA: Diagnosis not present

## 2018-03-20 DIAGNOSIS — E782 Mixed hyperlipidemia: Secondary | ICD-10-CM | POA: Diagnosis not present

## 2018-03-20 DIAGNOSIS — I1310 Hypertensive heart and chronic kidney disease without heart failure, with stage 1 through stage 4 chronic kidney disease, or unspecified chronic kidney disease: Secondary | ICD-10-CM | POA: Diagnosis not present

## 2018-03-20 DIAGNOSIS — Z0000 Encounter for general adult medical examination without abnormal findings: Secondary | ICD-10-CM | POA: Diagnosis not present

## 2018-07-06 ENCOUNTER — Other Ambulatory Visit: Payer: Self-pay | Admitting: Neurology

## 2018-07-24 ENCOUNTER — Encounter: Payer: Self-pay | Admitting: Neurology

## 2018-08-01 ENCOUNTER — Ambulatory Visit: Payer: Medicare Other | Admitting: Neurology

## 2018-09-22 DIAGNOSIS — E039 Hypothyroidism, unspecified: Secondary | ICD-10-CM | POA: Diagnosis not present

## 2018-09-22 DIAGNOSIS — I1310 Hypertensive heart and chronic kidney disease without heart failure, with stage 1 through stage 4 chronic kidney disease, or unspecified chronic kidney disease: Secondary | ICD-10-CM | POA: Diagnosis not present

## 2018-09-22 DIAGNOSIS — E782 Mixed hyperlipidemia: Secondary | ICD-10-CM | POA: Diagnosis not present

## 2018-09-22 DIAGNOSIS — N183 Chronic kidney disease, stage 3 (moderate): Secondary | ICD-10-CM | POA: Diagnosis not present

## 2018-09-24 DIAGNOSIS — E782 Mixed hyperlipidemia: Secondary | ICD-10-CM | POA: Diagnosis not present

## 2018-09-24 DIAGNOSIS — I1310 Hypertensive heart and chronic kidney disease without heart failure, with stage 1 through stage 4 chronic kidney disease, or unspecified chronic kidney disease: Secondary | ICD-10-CM | POA: Diagnosis not present

## 2018-09-24 DIAGNOSIS — G309 Alzheimer's disease, unspecified: Secondary | ICD-10-CM | POA: Diagnosis not present

## 2018-09-24 DIAGNOSIS — E039 Hypothyroidism, unspecified: Secondary | ICD-10-CM | POA: Diagnosis not present

## 2018-10-15 NOTE — Progress Notes (Signed)
NEUROLOGY FOLLOW UP OFFICE NOTE  Shaun Moore [id number]  HISTORY OF PRESENT ILLNESS: Shaun Moore is a 85 year old left-handed man with hypertension, hypothyroidism, CAD, and hypercholesterolemia who follows up for Alzheimer's dementia.  He is accompanied by his wife who supplements recent history.  UPDATE: Current medication:  Rivastigmine 3mg  twice daily.  Short-term memory is worse.  Repeating questions and conversations.  He is mostly quiet.  Sleeps often.  Sedentary.  Able to dress, bathe and use toilet independently.  He still drives without issue.  He sleeps well.  No agitation or hallucinations.  His wife handles medication management and finances.  HISTORY: For about 4 years, he has had short-term memory problems, which have gradually progressed. He frequently repeats himself, mostly stories about the past. He will often repeat questions, too. Sometimes, he will walk into a room and forget what he needed. When he meets a new person, he will quickly forget their name. He has no problems remembering names or recognizing acquaintances or family. He has been more depressed since he retired as an Clinical biochemistexcavation contractor over 5 years ago. He loved his job and lived to work. He has no family history of dementia. He has no personal history of head trauma or stroke. He went to school up until the 9th grade. 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  CT of head from 10/29/14 was unremarkable MRI of brain without contrast from 02/28/16 showed some atrophy and chronic small vessel disease. B12 from 07/27/15 was 345 and methymalonic acid level was 253.  He didn't tolerate Aricept.  Could not afford Exelon patch.  PAST MEDICAL HISTORY: Past Medical History:  Diagnosis Date  . Back pain   . BPH (benign prostatic hypertrophy)   . CAD (coronary artery disease)   . Cognitive impairment    dementia  . Esophageal reflux   . Essential hypertension, benign   . Headache    "pressure or heaviness in head"  . NSTEMI (non-ST elevated myocardial infarction) (HCC)   . OA (osteoarthritis)   . Pure hyperglyceridemia   . Rhinitis, allergic   . Vertigo     MEDICATIONS: Current Outpatient Medications on File Prior to Visit  Medication Sig Dispense Refill  . aspirin 81 MG tablet Take 81 mg by mouth daily.    . cephALEXin (KEFLEX) 500 MG capsule Take 500 mg by mouth 2 (two) times daily.  0  . finasteride (PROSCAR) 5 MG tablet Take 5 mg by mouth daily.   2  . fish oil-omega-3 fatty acids 1000 MG capsule Take 2 g by mouth daily.    Marland Kitchen. levothyroxine (SYNTHROID, LEVOTHROID) 25 MCG tablet Take 25 mcg by mouth daily before breakfast.   0  . losartan (COZAAR) 50 MG tablet Take 50 mg by mouth daily.    Marland Kitchen. neomycin-polymyxin b-dexamethasone (MAXITROL) 3.5-10000-0.1 OINT Place 1 application into the left eye 3 (three) times daily.  0  . rivastigmine (EXELON) 1.5 MG capsule Take 1 capsule twice daily for 14 days, then increase to 2 capsules twice daily 120 capsule 0  . rivastigmine (EXELON) 1.5 MG capsule TAKE 2 CAPSULES(3 MG) BY MOUTH TWICE DAILY 360 capsule 1  . tamsulosin (FLOMAX) 0.4 MG CAPS Take 0.4 mg by mouth daily.     No current facility-administered medications on file prior to visit.     ALLERGIES: Allergies  Allergen Reactions  . Ambien [Zolpidem Tartrate]     Stated that is "made him crazy"  . Lisinopril Cough  FAMILY HISTORY: Family History  Problem Relation Age of Onset  . CAD Father   . Hypertension Sister   . Cancer Sister        renal cell    SOCIAL HISTORY: Social History   Socioeconomic History  . Marital status: Married    Spouse name: Not on file  . Number of children: Not on file  . Years of education: Not on file  . Highest education level: Not on file  Occupational History  . Not on file  Social Needs  . Financial resource strain: Not on file  . Food insecurity    Worry: Not on file    Inability: Not on file  . Transportation  needs    Medical: Not on file    Non-medical: Not on file  Tobacco Use  . Smoking status: Never Smoker  . Smokeless tobacco: Never Used  Substance and Sexual Activity  . Alcohol use: No    Alcohol/week: 0.0 standard drinks  . Drug use: No  . Sexual activity: Yes    Partners: Female  Lifestyle  . Physical activity    Days per week: Not on file    Minutes per session: Not on file  . Stress: Not on file  Relationships  . Social Musicianconnections    Talks on phone: Not on file    Gets together: Not on file    Attends religious service: Not on file    Active member of club or organization: Not on file    Attends meetings of clubs or organizations: Not on file    Relationship status: Not on file  . Intimate partner violence    Fear of current or ex partner: Not on file    Emotionally abused: Not on file    Physically abused: Not on file    Forced sexual activity: Not on file  Other Topics Concern  . Not on file  Social History Narrative  . Not on file    REVIEW OF SYSTEMS: Constitutional: No fevers, chills, or sweats, no generalized fatigue, change in appetite Eyes: No visual changes, double vision, eye pain Ear, nose and throat: No hearing loss, ear pain, nasal congestion, sore throat Cardiovascular: No chest pain, palpitations Respiratory:  No shortness of breath at rest or with exertion, wheezes GastrointestinaI: No nausea, vomiting, diarrhea, abdominal pain, fecal incontinence Genitourinary:  No dysuria, urinary retention or frequency Musculoskeletal:  No neck pain, back pain Integumentary: No rash, pruritus, skin lesions Neurological: as above Psychiatric: No depression, insomnia, anxiety Endocrine: No palpitations, fatigue, diaphoresis, mood swings, change in appetite, change in weight, increased thirst Hematologic/Lymphatic:  No purpura, petechiae. Allergic/Immunologic: no itchy/runny eyes, nasal congestion, recent allergic reactions, rashes  PHYSICAL EXAM: Blood  pressure (!) 148/80, pulse 94, temperature 97.6 F (36.4 C), temperature source Oral, height 5\' 10"  (1.778 m), weight 193 lb (87.5 kg), SpO2 95 %. General: No acute distress.  Patient appears well-groomed.   Head:  Normocephalic/atraumatic Eyes:  Fundi examined but not visualized Neck: supple, no paraspinal tenderness, full range of motion Heart:  Regular rate and rhythm Lungs:  Clear to auscultation bilaterally Back: No paraspinal tenderness Neurological Exam: alert and oriented to person and place, but not time. Attention span and concentration intact, recent memory poor, remote memory intact, fund of knowledge fair.  Speech fluent and not dysarthric, language intact.  Unable to complete Trail Making Test MMSE - Mini Mental State Exam 10/17/2018 07/26/2017 07/26/2016  Orientation to time 0 2 4  Orientation to Place  5 4 3   Registration 3 3 2   Attention/ Calculation 5 2 4   Recall 0 0 2  Language- name 2 objects 2 2 2   Language- repeat 0 0 0  Language- repeat-comments Unable to hear - -  Language- follow 3 step command 3 3 2   Language- read & follow direction 1 1 1   Write a sentence 0 0 0  Copy design 0 0 0  Total score 19 17 20    CN II-XII intact. Bulk and tone normal, muscle strength 5/5 throughout.  Sensation to light touch intact.  Deep tendon reflexes 2+ throughout.  Finger to nose testing intact.  Gait normal  IMPRESSION: Alzheimer's dementia.  PLAN: 1.  Continue rivastigmine 3mg  twice daily 2.  Initiate memantine, titrating to 10mg  twice daily 3.  I explained to patient and his wife that I no longer feel it is safe for him to drive.  They appear to understand the conversation. 4.  Follow up in 6 months.  26 minutes spent face to face with patient, over 50% spent discussing plan and safety concerns.   Metta Clines, DO  CC: Mayra Neer, MD

## 2018-10-17 ENCOUNTER — Ambulatory Visit (INDEPENDENT_AMBULATORY_CARE_PROVIDER_SITE_OTHER): Payer: Medicare Other | Admitting: Neurology

## 2018-10-17 ENCOUNTER — Encounter: Payer: Self-pay | Admitting: Neurology

## 2018-10-17 ENCOUNTER — Other Ambulatory Visit: Payer: Self-pay

## 2018-10-17 VITALS — BP 148/80 | HR 94 | Temp 97.6°F | Ht 70.0 in | Wt 193.0 lb

## 2018-10-17 DIAGNOSIS — G301 Alzheimer's disease with late onset: Secondary | ICD-10-CM | POA: Diagnosis not present

## 2018-10-17 DIAGNOSIS — F0280 Dementia in other diseases classified elsewhere without behavioral disturbance: Secondary | ICD-10-CM | POA: Diagnosis not present

## 2018-10-17 MED ORDER — MEMANTINE HCL 10 MG PO TABS: ORAL_TABLET | ORAL | 0 refills | Status: DC

## 2018-10-17 NOTE — Patient Instructions (Signed)
1.  Continue rivastigmine 3mg  twice daily 2.  Start Namenda (memantine) 10mg  tablets.  Take 1/2 tablet at bedtime for 7 days, then 1/2 tablet twice daily for 7 days, then 1 tablet twice daily.   Side effects include dizziness, headache, diarrhea or constipation.  Call with any questions or concerns. 3.  I do not think it is safe for you to drive.   4.  Follow up in 6 months.

## 2018-10-19 ENCOUNTER — Emergency Department (HOSPITAL_COMMUNITY): Payer: Medicare Other

## 2018-10-19 ENCOUNTER — Encounter (HOSPITAL_COMMUNITY): Payer: Self-pay | Admitting: Emergency Medicine

## 2018-10-19 ENCOUNTER — Emergency Department (HOSPITAL_COMMUNITY)
Admission: EM | Admit: 2018-10-19 | Discharge: 2018-10-20 | Disposition: A | Discharge status: Home / Self Care | Payer: Medicare Other | Attending: Emergency Medicine | Admitting: Emergency Medicine

## 2018-10-19 DIAGNOSIS — I2510 Atherosclerotic heart disease of native coronary artery without angina pectoris: Secondary | ICD-10-CM | POA: Diagnosis not present

## 2018-10-19 DIAGNOSIS — R0602 Shortness of breath: Secondary | ICD-10-CM | POA: Diagnosis not present

## 2018-10-19 DIAGNOSIS — R079 Chest pain, unspecified: Secondary | ICD-10-CM | POA: Insufficient documentation

## 2018-10-19 DIAGNOSIS — I10 Essential (primary) hypertension: Secondary | ICD-10-CM | POA: Diagnosis not present

## 2018-10-19 DIAGNOSIS — R0902 Hypoxemia: Secondary | ICD-10-CM | POA: Diagnosis not present

## 2018-10-19 DIAGNOSIS — F0390 Unspecified dementia without behavioral disturbance: Secondary | ICD-10-CM | POA: Insufficient documentation

## 2018-10-19 DIAGNOSIS — Z20828 Contact with and (suspected) exposure to other viral communicable diseases: Secondary | ICD-10-CM | POA: Insufficient documentation

## 2018-10-19 DIAGNOSIS — R0600 Dyspnea, unspecified: Secondary | ICD-10-CM | POA: Diagnosis not present

## 2018-10-19 LAB — CBC
HCT: 45.9 % (ref 39.0–52.0)
Hemoglobin: 15.1 g/dL (ref 13.0–17.0)
MCH: 31.9 pg (ref 26.0–34.0)
MCHC: 32.9 g/dL (ref 30.0–36.0)
MCV: 96.8 fL (ref 80.0–100.0)
Platelets: 260 10*3/uL (ref 150–400)
RBC: 4.74 MIL/uL (ref 4.22–5.81)
RDW: 13 % (ref 11.5–15.5)
WBC: 9.8 10*3/uL (ref 4.0–10.5)
nRBC: 0 % (ref 0.0–0.2)

## 2018-10-19 LAB — BASIC METABOLIC PANEL
Anion gap: 10 (ref 5–15)
BUN: 18 mg/dL (ref 8–23)
CO2: 26 mmol/L (ref 22–32)
Calcium: 9 mg/dL (ref 8.9–10.3)
Chloride: 104 mmol/L (ref 98–111)
Creatinine, Ser: 1.33 mg/dL — ABNORMAL HIGH (ref 0.61–1.24)
GFR calc Af Amer: 54 mL/min — ABNORMAL LOW (ref >60)
GFR calc non Af Amer: 46 mL/min — ABNORMAL LOW (ref >60)
Glucose, Bld: 143 mg/dL — ABNORMAL HIGH (ref 70–99)
Potassium: 4.7 mmol/L (ref 3.5–5.1)
Sodium: 140 mmol/L (ref 135–145)

## 2018-10-19 LAB — TROPONIN I (HIGH SENSITIVITY): Troponin I (High Sensitivity): 11 ng/L (ref <18)

## 2018-10-19 LAB — BRAIN NATRIURETIC PEPTIDE: B Natriuretic Peptide: 26.1 pg/mL (ref 0.0–100.0)

## 2018-10-19 LAB — SARS CORONAVIRUS 2 BY RT PCR (HOSPITAL ORDER, PERFORMED IN ~~LOC~~ HOSPITAL LAB): SARS Coronavirus 2: NEGATIVE

## 2018-10-19 NOTE — ED Provider Notes (Signed)
[facility] Emergency Department Provider Note MRN:  [id number]  Arrival date & time: 10/19/18     Chief Complaint   Shortness of Breath and Dementia   History of Present Illness   Shaun Moore is a 85 y.o. year-old male with a history of dementia, CAD presenting to the ED with chief complaint of shortness of breath.  Increased shortness of breath today, family reports rapid breathing.  Patient also endorsing brief episode of chest pain earlier today.  I was unable to obtain an accurate HPI, PMH, or ROS due to the patient's dementia.  Level 5 caveat.  Review of Systems  A complete 10 system review of systems was obtained and all systems are negative except as noted in the HPI and PMH.   Patient's Health History    Past Medical History:  Diagnosis Date  . Back pain   . BPH (benign prostatic hypertrophy)   . CAD (coronary artery disease)   . Cognitive impairment    dementia  . Esophageal reflux   . Essential hypertension, benign   . Headache    "pressure or heaviness in head"  . NSTEMI (non-ST elevated myocardial infarction) ([place])   . OA (osteoarthritis)   . Pure hyperglyceridemia   . Rhinitis, allergic   . Vertigo     Past Surgical History:  Procedure Laterality Date  . CARDIAC CATHETERIZATION     DE stent RCA and LAD    Family History  Problem Relation Age of Onset  . CAD Father   . Hypertension Sister   . Cancer Sister        renal cell     Social History   Socioeconomic History  . Marital status: Married    Spouse name: Not on file  . Number of children: Not on file  . Years of education: Not on file  . Highest education level: Not on file  Occupational History  . Not on file  Social Needs  . Financial resource strain: Not on file  . Food insecurity    Worry: Not on file    Inability: Not on file  . Transportation needs    Medical: Not on file    Non-medical: Not on file  Tobacco Use  . Smoking status: Never Smoker  . Smokeless  tobacco: Never Used  Substance and Sexual Activity  . Alcohol use: No    Alcohol/week: 0.0 standard drinks  . Drug use: No  . Sexual activity: Yes    Partners: Female  Lifestyle  . Physical activity    Days per week: Not on file    Minutes per session: Not on file  . Stress: Not on file  Relationships  . Social Herbalist on phone: Not on file    Gets together: Not on file    Attends religious service: Not on file    Active member of club or organization: Not on file    Attends meetings of clubs or organizations: Not on file    Relationship status: Not on file  . Intimate partner violence    Fear of current or ex partner: Not on file    Emotionally abused: Not on file    Physically abused: Not on file    Forced sexual activity: Not on file  Other Topics Concern  . Not on file  Social History Narrative  . Not on file     Physical Exam  Vital Signs and Nursing Notes reviewed Vitals:  10/19/18 2100 10/19/18 2230  BP: (!) 173/97 (!) 167/83  Pulse: 79 80  Resp: (!) 25 (!) 24  Temp:    SpO2: 99% 97%    CONSTITUTIONAL: Well-appearing, NAD NEURO:  Alert and oriented x 3, no focal deficits EYES:  eyes equal and reactive ENT/NECK:  no LAD, no JVD CARDIO: Regular rate, well-perfused, normal S1 and S2 PULM: Decreased breath sounds bilateral bases, tachypneic GI/GU:  normal bowel sounds, non-distended, non-tender MSK/SPINE:  No gross deformities, no edema SKIN:  no rash, atraumatic PSYCH:  Appropriate speech and behavior  Diagnostic and Interventional Summary    EKG Interpretation  Date/Time:  Sunday October 19 2018 20:27:20 EDT Ventricular Rate:  79 PR Interval:    QRS Duration: 143 QT Interval:  396 QTC Calculation: 454 R Axis:   87 Text Interpretation:  Sinus rhythm Right bundle branch block Inferior infarct, old Confirmed by Kennis CarinaBero, Rakwon Letourneau [phone]([id number]) on 10/19/2018 10:36:51 PM      Labs Reviewed  BASIC METABOLIC PANEL - Abnormal; Notable for the  following components:      Result Value   Glucose, Bld 143 (*)    Creatinine, Ser 1.33 (*)    GFR calc non Af Amer 46 (*)    GFR calc Af Amer 54 (*)    All other components within normal limits  SARS CORONAVIRUS 2 (HOSPITAL ORDER, PERFORMED IN [facility])  CBC  BRAIN NATRIURETIC PEPTIDE  TROPONIN I (HIGH SENSITIVITY)  TROPONIN I (HIGH SENSITIVITY)    DG Chest Port 1 View  Final Result      Medications - No data to display   Procedures Critical Care  ED Course and Medical Decision Making  I have reviewed the triage vital signs and the nursing notes.  Pertinent labs & imaging results that were available during my care of the patient were reviewed by me and considered in my medical decision making (see below for details).  Considering pneumonia, new onset CHF, less likely PE given the lack of evidence of DVT, no tachycardia.  Also considering ACS.  Labs pending, patient is still tachypneic, new oxygen requirement, anticipating admission.  Signed out to oncoming provider at shift change.  Elmer SowMichael M. Pilar PlateBero, MD [facility] [email or web address]  Final Clinical Impressions(s) / ED Diagnoses     ICD-10-CM   1. Shortness of breath  R06.02   2. Chest pain  R07.9 DG [facility]    DG Chest [facility] 1 View    ED Discharge Orders    None         Sabas SousBero, Charlisha Market M, MD 10/19/18 2316

## 2018-10-19 NOTE — ED Triage Notes (Signed)
Patient here from home with complaints of SOB on exertion that started 3 days ago. No other complaints. Denies chest pain. AAO x3. 90% RA.

## 2018-10-20 LAB — TROPONIN I (HIGH SENSITIVITY): Troponin I (High Sensitivity): 11 ng/L (ref <18)

## 2018-10-20 NOTE — Discharge Instructions (Addendum)
Please call Dr. Raul Del office to have him followed up this week.  Return to the emergency department if he gets fever, cough, or he struggles to breathe.  Or if you see any other acute change.

## 2018-10-20 NOTE — ED Provider Notes (Signed)
Patient left at change of shift to get the results of his tests and decide if he needs to be admitted.  When I saw the patient at 1:30 AM I stopped his nasal cannula oxygen.  At that point his pulse ox was 99 to 100%.  Recheck around 2 AM patient's pulse ox is 96%.  I talked to his son who is in the room that the chest x-ray may be an artifact if he was unable to take a big deep breath when they did the x-ray.  Patient has dementia and has trouble following commands.  Son is comfortable taking him home.  He states it is very easy to get a follow-up appointment with the patient's primary care doctor.  Patient was discharged home with his son.  Please note patient did not appear to be short of breath.  He did not complain of any chest pain to me.  Results for orders placed or performed during the hospital encounter of 10/19/18  SARS Coronavirus 2 [facility] order, Performed in [facility] hospital lab) Nasopharyngeal Nasopharyngeal Swab   Specimen: Nasopharyngeal Swab  Result Value Ref Range   SARS Coronavirus 2 NEGATIVE NEGATIVE  CBC  Result Value Ref Range   WBC 9.8 4.0 - 10.5 K/uL   RBC 4.74 4.22 - 5.81 MIL/uL   Hemoglobin 15.1 13.0 - 17.0 g/dL   HCT 45.9 39.0 - 52.0 %   MCV 96.8 80.0 - 100.0 fL   MCH 31.9 26.0 - 34.0 pg   MCHC 32.9 30.0 - 36.0 g/dL   RDW 13.0 11.5 - 15.5 %   Platelets 260 150 - 400 K/uL   nRBC 0.0 0.0 - 0.2 %  Basic metabolic panel  Result Value Ref Range   Sodium 140 135 - 145 mmol/L   Potassium 4.7 3.5 - 5.1 mmol/L   Chloride 104 98 - 111 mmol/L   CO2 26 22 - 32 mmol/L   Glucose, Bld 143 (H) 70 - 99 mg/dL   BUN 18 8 - 23 mg/dL   Creatinine, Ser 1.33 (H) 0.61 - 1.24 mg/dL   Calcium 9.0 8.9 - 10.3 mg/dL   GFR calc non Af Amer 46 (L) >60 mL/min   GFR calc Af Amer 54 (L) >60 mL/min   Anion gap 10 5 - 15  Brain natriuretic peptide  Result Value Ref Range   B Natriuretic Peptide 26.1 0.0 - 100.0 pg/mL  Troponin I (High Sensitivity)  Result Value Ref Range   Troponin I (High Sensitivity) 11 <18 ng/L  Troponin I (High Sensitivity)  Result Value Ref Range   Troponin I (High Sensitivity) 11 <18 ng/L   Laboratory interpretation all normal except mild renal insufficiency, negative delta troponin   Dg Chest Port 1 View  Result Date: 10/19/2018 CLINICAL DATA:  Shortness of breath EXAM: PORTABLE CHEST 1 VIEW COMPARISON:  August 17, 2017 FINDINGS: The heart size is stable. Aortic calcifications are noted. There are new bilateral pleural effusions, right greater than left. There is no displaced fracture. No pneumothorax. The lung volumes are low. IMPRESSION: Low lung volumes with new small bilateral pleural effusions. Electronically Signed   By: Constance Holster M.D.   On: 10/19/2018 22:37   Diagnoses that have been ruled out:  None  Diagnoses that are still under consideration:  None  Final diagnoses:  Chest pain  Shortness of breath  Dementia without behavioral disturbance, unspecified dementia type [facility])   Plan discharge  Rolland Porter, MD, Barbette Or, MD  10/20/18 0234  

## 2018-11-24 ENCOUNTER — Other Ambulatory Visit: Payer: Self-pay | Admitting: Neurology

## 2018-12-27 DIAGNOSIS — Z23 Encounter for immunization: Secondary | ICD-10-CM | POA: Diagnosis not present

## 2019-01-05 ENCOUNTER — Other Ambulatory Visit: Payer: Self-pay | Admitting: Neurology

## 2019-01-05 NOTE — Telephone Encounter (Signed)
Requested Prescriptions   Pending Prescriptions Disp Refills  . memantine (NAMENDA) 10 MG tablet [Pharmacy Med Name: MEMANTINE 10MG  TABLETS] 60 tablet 0    Sig: TAKE ONE-HALF TABLET BY MOUTH AT BEDTIME FOR 1 WEEK, ONE-HALF TABLET TWICE DAILY FOR 1 WEEK, THEN 1 TABLET TWICE DAILY   Rx last filled:9/22/0 #60 0 refills  Pt last seen:10/17/18   Follow up appt scheduled: 04/21/19

## 2019-02-02 ENCOUNTER — Other Ambulatory Visit: Payer: Self-pay | Admitting: Neurology

## 2019-02-02 NOTE — Telephone Encounter (Signed)
Requested Prescriptions   Pending Prescriptions Disp Refills  . rivastigmine (EXELON) 1.5 MG capsule [Pharmacy Med Name: RIVASTIGMINE 1.5MG  CAPSULES] 360 capsule 1    Sig: TAKE 2 CAPSULES BY MOUTH TWICE DAILY   Rx last filled: 07/27/18 #120 0 refills  Pt last seen: 10/17/18   Follow up appt scheduled: 04/21/2019

## 2019-03-18 ENCOUNTER — Ambulatory Visit: Payer: Medicare Other | Attending: Internal Medicine

## 2019-03-18 DIAGNOSIS — Z23 Encounter for immunization: Secondary | ICD-10-CM

## 2019-03-18 NOTE — Progress Notes (Signed)
   Covid-19 Vaccination Clinic  Name:  Shaun Moore    MRN: [id number] DOB: 15-Mar-1925  03/18/2019  Shaun Moore was observed post Covid-19 immunization for 15 minutes without incidence. He was provided with Vaccine Information Sheet and instruction to access the V-Safe system.   Shaun Moore was instructed to call 911 with any severe reactions post vaccine: Marland Kitchen Difficulty breathing  . Swelling of your face and throat  . A fast heartbeat  . A bad rash all over your body  . Dizziness and weakness    Immunizations Administered    Name Date Dose VIS Date Route   Pfizer COVID-19 Vaccine 03/18/2019 10:42 AM 0.3 mL 02/13/2019 Intramuscular   Manufacturer: [employer], [employer]   Lot: [id number]   NDC: [address]-8

## 2019-04-07 ENCOUNTER — Ambulatory Visit: Payer: Medicare Other | Attending: Internal Medicine

## 2019-04-07 DIAGNOSIS — Z23 Encounter for immunization: Secondary | ICD-10-CM | POA: Insufficient documentation

## 2019-04-07 NOTE — Progress Notes (Signed)
   Covid-19 Vaccination Clinic  Name:  MAYCEN DEGREGORY    MRN: [id number] DOB: 08/08/1925  04/07/2019  Mr. Schartz was observed post Covid-19 immunization for 15 minutes without incidence. He was provided with Vaccine Information Sheet and instruction to access the V-Safe system.   Mr. Mian was instructed to call 911 with any severe reactions post vaccine: Marland Kitchen Difficulty breathing  . Swelling of your face and throat  . A fast heartbeat  . A bad rash all over your body  . Dizziness and weakness    Immunizations Administered    Name Date Dose VIS Date Route   Pfizer COVID-19 Vaccine 04/07/2019  8:55 AM 0.3 mL 02/13/2019 Intramuscular   Manufacturer: [employer], [employer]   Lot: [id number]   NDC: [address]-5

## 2019-04-08 DIAGNOSIS — J90 Pleural effusion, not elsewhere classified: Secondary | ICD-10-CM | POA: Diagnosis not present

## 2019-04-08 DIAGNOSIS — I1310 Hypertensive heart and chronic kidney disease without heart failure, with stage 1 through stage 4 chronic kidney disease, or unspecified chronic kidney disease: Secondary | ICD-10-CM | POA: Diagnosis not present

## 2019-04-08 DIAGNOSIS — Z0000 Encounter for general adult medical examination without abnormal findings: Secondary | ICD-10-CM | POA: Diagnosis not present

## 2019-04-08 DIAGNOSIS — N1831 Chronic kidney disease, stage 3a: Secondary | ICD-10-CM | POA: Diagnosis not present

## 2019-04-14 ENCOUNTER — Ambulatory Visit (INDEPENDENT_AMBULATORY_CARE_PROVIDER_SITE_OTHER): Payer: Medicare Other | Admitting: Otolaryngology

## 2019-04-16 DIAGNOSIS — H6120 Impacted cerumen, unspecified ear: Secondary | ICD-10-CM | POA: Diagnosis not present

## 2019-04-20 ENCOUNTER — Encounter: Payer: Self-pay | Admitting: Neurology

## 2019-04-20 NOTE — Progress Notes (Signed)
Due to the COVID-19 crisis, this virtual check-in visit was done via telephone from my office and it was initiated and consent given by this patient and or family.   Telephone (Audio) Visit The purpose of this telephone visit is to provide medical care while limiting exposure to the novel coronavirus.    Consent was obtained for telephone visit and initiated by pt/family:  Yes.   Answered questions that patient had about telehealth interaction:  Yes.   I discussed the limitations, risks, security and privacy concerns of performing an evaluation and management service by telephone. I also discussed with the patient that there may be a patient responsible charge related to this service. The patient expressed understanding and agreed to proceed.  Pt location: Home Physician Location: office Name of referring provider:  Mayra Neer, MD I connected with .Shaun Moore at patients initiation/request on 04/21/2019 at 10:30 AM EST by telephone and verified that I am speaking with the correct person using two identifiers.  Pt MRN:  [id number] Pt DOB:  January 07, 1926   History of Present Illness:  Shaun Moore is a 86 year old left-handed man with hypertension, hypothyroidism, CAD, and hypercholesterolemia who follows up for Alzheimer's dementia. He is accompanied by his granddaughter and wife who supplements recent history.  UPDATE: Current medication:  Rivastigmine 3mg  twice daily; memantine 10mg  twice daily.  Short-term memory continues to get worse.  Repeating questions and conversations more frequently.  Now also repeating stories.  Has trouble figuring out to use the toilet.  Hygiene had decreased. He no longer drives. He no longer has access to firearms.  He sometimes has mild panic attacks and head doesn't feel right, which occurs once every 2 to 3 months.  .  He sleeps well.  Appetite is generally good.  No hallucinations.  His wife and granddaughter are his primary caregivers.  His wife handles  medication management and finances.  More alert since starting memantine.    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  CT of head from 10/29/14 was unremarkable MRI of brain without contrast from 02/28/16 showed some atrophy and chronic small vessel disease. B12 from 07/27/15 was 345 and methymalonic acid level was 253.  He didnt tolerate Aricept.  Could not afford Exelon patch.  Observations/Objective:   There were no vitals filed for this visit.  Assessment and Plan:   Alzheimer's dementia  1.  Rivastigmine 3mg  twice daily 2.  memantine 10mg  twice daily 3.  Follow up in 6 months.  Need for in person visit now:  No. Follow Up Instructions:    -I discussed the assessment and treatment plan with the patient. The patient was provided an opportunity to ask questions and all were answered. The patient agreed with the plan and demonstrated an understanding of the instructions.   The patient was advised to call back or seek an in-person  evaluation if the symptoms worsen or if the condition fails to improve as anticipated.    Total Time spent in visit with the patient was:  12 minutes  Cira Servant, DO

## 2019-04-21 ENCOUNTER — Telehealth (INDEPENDENT_AMBULATORY_CARE_PROVIDER_SITE_OTHER): Payer: Medicare Other | Admitting: Neurology

## 2019-04-21 ENCOUNTER — Other Ambulatory Visit: Payer: Self-pay

## 2019-04-21 DIAGNOSIS — G309 Alzheimer's disease, unspecified: Secondary | ICD-10-CM | POA: Diagnosis not present

## 2019-04-21 DIAGNOSIS — F0280 Dementia in other diseases classified elsewhere without behavioral disturbance: Secondary | ICD-10-CM | POA: Diagnosis not present

## 2019-07-29 ENCOUNTER — Other Ambulatory Visit: Payer: Self-pay | Admitting: Neurology

## 2019-08-16 ENCOUNTER — Other Ambulatory Visit: Payer: Self-pay | Admitting: Neurology

## 2019-10-19 ENCOUNTER — Encounter: Payer: Self-pay | Admitting: Neurology

## 2019-10-19 NOTE — Progress Notes (Signed)
NEUROLOGY FOLLOW UP OFFICE NOTE  Shaun Moore [id number]  HISTORY OF PRESENT ILLNESS: Shaun Moore is a 86year old left-handed man with hypertension, hypothyroidism, CAD, and hypercholesterolemia who follows up for Alzheimer's dementia He is accompanied by his granddaughter who supplements recent history.  UPDATE: Current medication: Rivastigmine 3mg  twice daily; memantine 10mg  twice daily..    Wife primary caregiver.  No in-home support as it does not seem financially feasible.  Grandchildren and children help with transportation.  He is independent in bathing and using toilet.  However, since he has trouble standing up from the toilet seat, he still tries to urinate standing up and miss the bowl.  He does have difficulty dressing and therefore wears sweat pants or pajamas and slippers for convenience.  His meals are small but he frequently eats sweets.  He goes outside once a day, usually to fill the bird bath.  He goes to sleep at around 2 AM and will wake up at 8-9 in AM.  He naps frequently throughout the day.  He does exhibit sundowning.  He may become agitated in the early evening, presenting with a panic attack and demanding to go to the hospital.  This occurs about 4 times a month.  He has a Xanax as needed, which helps.  No hallucinations.  HISTORY: Since approximately early 2015, he has had short-term memory problems, which have gradually progressed.He frequently repeats himself, mostly stories about the past.He will often repeat questions, too.Sometimes, he will walk into a room and forget what he needed.When he meets a new person, he will quickly forget their name.He has no problems remembering names or recognizing acquaintances or family.He has been more depressed since he retired as an over 5 years ago.He loved his job and lived to work. 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  CT of head from 10/29/14 was unremarkable MRI of brain without contrast from 02/28/16 showed some atrophy and chronic small vessel disease. B12 from 07/27/15 was 345 and methymalonic acid level was 253.  He didn't tolerate Aricept.Could not afford Exelon patch.  PAST MEDICAL HISTORY: Past Medical History:  Diagnosis Date  . Back pain   . BPH (benign prostatic hypertrophy)   . CAD (coronary artery disease)   . Cognitive impairment    dementia  . Esophageal reflux   . Essential hypertension, benign   . Headache    "pressure or heaviness in head"  . NSTEMI (non-ST elevated myocardial infarction) (HCC)   . OA (osteoarthritis)   . Pure hyperglyceridemia   . Rhinitis, allergic   . Vertigo     MEDICATIONS: Current Outpatient Medications on File Prior to Visit  Medication Sig Dispense Refill  . aspirin 81 MG tablet Take 81 mg by mouth daily.    . finasteride (PROSCAR) 5 MG tablet Take 5 mg by mouth daily.   2  . levothyroxine (SYNTHROID, LEVOTHROID) 25 MCG tablet Take 25 mcg by mouth daily before breakfast.   0  . losartan (COZAAR) 50 MG tablet Take 50 mg by mouth daily.    . memantine (NAMENDA) 10 MG tablet TAKE 1 TABLET BY MOUTH TWICE DAILY 60 tablet 5  . rivastigmine (EXELON) 1.5 MG capsule TAKE 2 CAPSULES BY MOUTH TWICE DAILY 360 capsule 1  . simvastatin (ZOCOR) 20 MG tablet Take 20 mg by mouth daily at 6  PM.    . tamsulosin (FLOMAX) 0.4 MG CAPS Take 0.4 mg by mouth daily.     No current facility-administered medications on file prior to visit.    ALLERGIES: Allergies  Allergen Reactions  . Ambien [Zolpidem Tartrate]     Stated that is "made him crazy"  . Lisinopril Cough    FAMILY HISTORY: Family History  Problem Relation Age of Onset  . CAD Father   . Hypertension Sister   . Cancer Sister        renal cell     SOCIAL HISTORY: Social History   Socioeconomic History  . Marital  status: Married    Spouse name: Not on file  . Number of children: 2  . Years of education: 10  . Highest education level: Not on file  Occupational History  . Occupation: retired  Tobacco Use  . Smoking status: Never Smoker  . Smokeless tobacco: Never Used  Vaping Use  . Vaping Use: Never assessed  Substance and Sexual Activity  . Alcohol use: No    Alcohol/week: 0.0 standard drinks  . Drug use: No  . Sexual activity: Yes    Partners: Female  Other Topics Concern  . Not on file  Social History Narrative   Left handed   One story home   Drinks one cup of coffee daily   Social Determinants of Health   Financial Resource Strain:   . Difficulty of Paying Living Expenses:   Food Insecurity:   . Worried About Programme researcher, broadcasting/film/video in the Last Year:   . Barista in the Last Year:   Transportation Needs:   . Freight forwarder (Medical):   Marland Kitchen Lack of Transportation (Non-Medical):   Physical Activity:   . Days of Exercise per Week:   . Minutes of Exercise per Session:   Stress:   . Feeling of Stress :   Social Connections:   . Frequency of Communication with Friends and Family:   . Frequency of Social Gatherings with Friends and Family:   . Attends Religious Services:   . Active Member of Clubs or Organizations:   . Attends Banker Meetings:   Marland Kitchen Marital Status:   Intimate Partner Violence:   . Fear of Current or Ex-Partner:   . Emotionally Abused:   Marland Kitchen Physically Abused:   . Sexually Abused:     PHYSICAL EXAM: Blood pressure (!) 157/84, pulse 93, height 5\' 10"  (1.778 m), weight 196 lb (88.9 kg), SpO2 94 %. General: No acute distress.  Patient appears well-groomed.   Head:  Normocephalic/atraumatic Eyes:  Fundi examined but not visualized Neck: supple, no paraspinal tenderness, full range of motion Heart:  Regular rate and rhythm Lungs:  Clear to auscultation bilaterally Back: No paraspinal tenderness Neurological Exam: alert and oriented to  person. Attention span and concentration impaired, recent and remote memory impaired, fund of knowledge impaired.  Paucity of speech but fluent and not dysarthric.  Language overall intact.  Has trouble following some commands. [employer] Mental Exam 10/20/2019  Weekday Correct 0  Current year 0  What state are we in? 1  Amount spent 0  Amount left 0  # of Animals 1  5 objects recall 0  Number series 0  Hour markers 0  Time correct 0  Placed X in triangle correctly 1  Largest Figure 1  Name of male 0  Date back to work 0  Type of work 08-17-1995 she lived in  0  Total score 4   CN II-XII intact. Bulk and tone normal, muscle strength 5/5 throughout.  Bradykinesia. Sensation to light touch, temperature and vibration intact.  Deep tendon reflexes absent throughout.  Finger to nose intact.  Gait shuffling.  Romberg with mild sway.  IMPRESSION: Major neurocognitive disorder, Alzheimer's.  He definitely needs 24 hour supervision and care.  However, I am concerned about his wife remaining the primary caregiver due to her advanced age, arthritis, and not being as strong as her husband.  PLAN: 1. Rivastigmine 3mg  twice daily and memantine 10mg  twice daily 2.  Provided family with information and resources to seek out help to determine if there is anything available that would be economically feasible. 3.  Follow up in 9 months.  , DO  CC: , MD

## 2019-10-20 ENCOUNTER — Other Ambulatory Visit: Payer: Self-pay

## 2019-10-20 ENCOUNTER — Encounter: Payer: Self-pay | Admitting: Neurology

## 2019-10-20 ENCOUNTER — Ambulatory Visit: Payer: Medicare Other | Admitting: Neurology

## 2019-10-20 VITALS — BP 157/84 | HR 93 | Ht 70.0 in | Wt 196.0 lb

## 2019-10-20 DIAGNOSIS — G309 Alzheimer's disease, unspecified: Secondary | ICD-10-CM | POA: Diagnosis not present

## 2019-10-20 DIAGNOSIS — F0280 Dementia in other diseases classified elsewhere without behavioral disturbance: Secondary | ICD-10-CM

## 2019-10-20 NOTE — Patient Instructions (Addendum)
1.  No change in medication 2.  Try taking melatonin 1mg  to 3mg  at bedtime 3.  Please review information of resources 4.  Follow up in 9 months

## 2020-01-07 DIAGNOSIS — L603 Nail dystrophy: Secondary | ICD-10-CM | POA: Diagnosis not present

## 2020-01-07 DIAGNOSIS — F0390 Unspecified dementia without behavioral disturbance: Secondary | ICD-10-CM | POA: Diagnosis not present

## 2020-01-07 DIAGNOSIS — G309 Alzheimer's disease, unspecified: Secondary | ICD-10-CM | POA: Diagnosis not present

## 2020-01-20 ENCOUNTER — Emergency Department (HOSPITAL_COMMUNITY): Payer: Medicare Other

## 2020-01-20 ENCOUNTER — Inpatient Hospital Stay (HOSPITAL_COMMUNITY)
Admission: EM | Admit: 2020-01-20 | Discharge: 2020-02-05 | DRG: 640 | Disposition: A | Discharge status: Skilled Nursing Facility | Payer: Medicare Other | Attending: Internal Medicine | Admitting: Internal Medicine

## 2020-01-20 ENCOUNTER — Telehealth: Payer: Self-pay

## 2020-01-20 DIAGNOSIS — I4510 Unspecified right bundle-branch block: Secondary | ICD-10-CM | POA: Diagnosis not present

## 2020-01-20 DIAGNOSIS — J9811 Atelectasis: Secondary | ICD-10-CM | POA: Diagnosis present

## 2020-01-20 DIAGNOSIS — R000 Tachycardia, unspecified: Secondary | ICD-10-CM | POA: Diagnosis present

## 2020-01-20 DIAGNOSIS — R296 Repeated falls: Secondary | ICD-10-CM | POA: Diagnosis present

## 2020-01-20 DIAGNOSIS — Z79899 Other long term (current) drug therapy: Secondary | ICD-10-CM

## 2020-01-20 DIAGNOSIS — E860 Dehydration: Principal | ICD-10-CM | POA: Diagnosis present

## 2020-01-20 DIAGNOSIS — J9601 Acute respiratory failure with hypoxia: Secondary | ICD-10-CM | POA: Diagnosis present

## 2020-01-20 DIAGNOSIS — T796XXD Traumatic ischemia of muscle, subsequent encounter: Secondary | ICD-10-CM | POA: Diagnosis not present

## 2020-01-20 DIAGNOSIS — N179 Acute kidney failure, unspecified: Secondary | ICD-10-CM | POA: Diagnosis present

## 2020-01-20 DIAGNOSIS — R338 Other retention of urine: Secondary | ICD-10-CM | POA: Diagnosis present

## 2020-01-20 DIAGNOSIS — E039 Hypothyroidism, unspecified: Secondary | ICD-10-CM | POA: Diagnosis present

## 2020-01-20 DIAGNOSIS — K219 Gastro-esophageal reflux disease without esophagitis: Secondary | ICD-10-CM | POA: Diagnosis present

## 2020-01-20 DIAGNOSIS — K567 Ileus, unspecified: Secondary | ICD-10-CM | POA: Diagnosis present

## 2020-01-20 DIAGNOSIS — E781 Pure hyperglyceridemia: Secondary | ICD-10-CM | POA: Diagnosis present

## 2020-01-20 DIAGNOSIS — S0990XA Unspecified injury of head, initial encounter: Secondary | ICD-10-CM | POA: Diagnosis not present

## 2020-01-20 DIAGNOSIS — G319 Degenerative disease of nervous system, unspecified: Secondary | ICD-10-CM | POA: Diagnosis not present

## 2020-01-20 DIAGNOSIS — K560 Paralytic ileus: Secondary | ICD-10-CM | POA: Diagnosis not present

## 2020-01-20 DIAGNOSIS — I2510 Atherosclerotic heart disease of native coronary artery without angina pectoris: Secondary | ICD-10-CM | POA: Diagnosis not present

## 2020-01-20 DIAGNOSIS — Z7982 Long term (current) use of aspirin: Secondary | ICD-10-CM

## 2020-01-20 DIAGNOSIS — D72829 Elevated white blood cell count, unspecified: Secondary | ICD-10-CM | POA: Diagnosis present

## 2020-01-20 DIAGNOSIS — F0281 Dementia in other diseases classified elsewhere with behavioral disturbance: Secondary | ICD-10-CM | POA: Diagnosis present

## 2020-01-20 DIAGNOSIS — N1832 Chronic kidney disease, stage 3b: Secondary | ICD-10-CM | POA: Diagnosis present

## 2020-01-20 DIAGNOSIS — R0902 Hypoxemia: Secondary | ICD-10-CM | POA: Diagnosis not present

## 2020-01-20 DIAGNOSIS — Z66 Do not resuscitate: Secondary | ICD-10-CM | POA: Diagnosis present

## 2020-01-20 DIAGNOSIS — R0602 Shortness of breath: Secondary | ICD-10-CM | POA: Diagnosis not present

## 2020-01-20 DIAGNOSIS — R4182 Altered mental status, unspecified: Secondary | ICD-10-CM

## 2020-01-20 DIAGNOSIS — N401 Enlarged prostate with lower urinary tract symptoms: Secondary | ICD-10-CM | POA: Diagnosis present

## 2020-01-20 DIAGNOSIS — Z20822 Contact with and (suspected) exposure to covid-19: Secondary | ICD-10-CM | POA: Diagnosis not present

## 2020-01-20 DIAGNOSIS — R0603 Acute respiratory distress: Secondary | ICD-10-CM

## 2020-01-20 DIAGNOSIS — I6782 Cerebral ischemia: Secondary | ICD-10-CM | POA: Diagnosis not present

## 2020-01-20 DIAGNOSIS — Z515 Encounter for palliative care: Secondary | ICD-10-CM | POA: Diagnosis not present

## 2020-01-20 DIAGNOSIS — K56609 Unspecified intestinal obstruction, unspecified as to partial versus complete obstruction: Secondary | ICD-10-CM | POA: Diagnosis not present

## 2020-01-20 DIAGNOSIS — E876 Hypokalemia: Secondary | ICD-10-CM | POA: Diagnosis present

## 2020-01-20 DIAGNOSIS — F02818 Dementia in other diseases classified elsewhere, unspecified severity, with other behavioral disturbance: Secondary | ICD-10-CM | POA: Diagnosis present

## 2020-01-20 DIAGNOSIS — J90 Pleural effusion, not elsewhere classified: Secondary | ICD-10-CM | POA: Diagnosis not present

## 2020-01-20 DIAGNOSIS — I252 Old myocardial infarction: Secondary | ICD-10-CM

## 2020-01-20 DIAGNOSIS — I129 Hypertensive chronic kidney disease with stage 1 through stage 4 chronic kidney disease, or unspecified chronic kidney disease: Secondary | ICD-10-CM | POA: Diagnosis present

## 2020-01-20 DIAGNOSIS — I700 Atherosclerosis of aorta: Secondary | ICD-10-CM | POA: Diagnosis not present

## 2020-01-20 DIAGNOSIS — R140 Abdominal distension (gaseous): Secondary | ICD-10-CM | POA: Diagnosis not present

## 2020-01-20 DIAGNOSIS — M6282 Rhabdomyolysis: Secondary | ICD-10-CM | POA: Diagnosis present

## 2020-01-20 DIAGNOSIS — K5730 Diverticulosis of large intestine without perforation or abscess without bleeding: Secondary | ICD-10-CM | POA: Diagnosis not present

## 2020-01-20 DIAGNOSIS — T796XXA Traumatic ischemia of muscle, initial encounter: Secondary | ICD-10-CM

## 2020-01-20 DIAGNOSIS — M47816 Spondylosis without myelopathy or radiculopathy, lumbar region: Secondary | ICD-10-CM | POA: Diagnosis not present

## 2020-01-20 DIAGNOSIS — M545 Low back pain, unspecified: Secondary | ICD-10-CM | POA: Diagnosis not present

## 2020-01-20 DIAGNOSIS — R2681 Unsteadiness on feet: Secondary | ICD-10-CM

## 2020-01-20 DIAGNOSIS — Z8249 Family history of ischemic heart disease and other diseases of the circulatory system: Secondary | ICD-10-CM

## 2020-01-20 DIAGNOSIS — R079 Chest pain, unspecified: Secondary | ICD-10-CM | POA: Diagnosis not present

## 2020-01-20 DIAGNOSIS — G459 Transient cerebral ischemic attack, unspecified: Secondary | ICD-10-CM | POA: Diagnosis not present

## 2020-01-20 DIAGNOSIS — K4090 Unilateral inguinal hernia, without obstruction or gangrene, not specified as recurrent: Secondary | ICD-10-CM | POA: Diagnosis not present

## 2020-01-20 DIAGNOSIS — K6389 Other specified diseases of intestine: Secondary | ICD-10-CM | POA: Diagnosis not present

## 2020-01-20 DIAGNOSIS — G309 Alzheimer's disease, unspecified: Secondary | ICD-10-CM | POA: Diagnosis not present

## 2020-01-20 DIAGNOSIS — Z79890 Hormone replacement therapy (postmenopausal): Secondary | ICD-10-CM

## 2020-01-20 DIAGNOSIS — G9341 Metabolic encephalopathy: Secondary | ICD-10-CM | POA: Diagnosis present

## 2020-01-20 DIAGNOSIS — Z888 Allergy status to other drugs, medicaments and biological substances status: Secondary | ICD-10-CM

## 2020-01-20 DIAGNOSIS — Z7189 Other specified counseling: Secondary | ICD-10-CM | POA: Diagnosis not present

## 2020-01-20 DIAGNOSIS — R410 Disorientation, unspecified: Secondary | ICD-10-CM | POA: Diagnosis not present

## 2020-01-20 DIAGNOSIS — I10 Essential (primary) hypertension: Secondary | ICD-10-CM | POA: Diagnosis present

## 2020-01-20 DIAGNOSIS — J309 Allergic rhinitis, unspecified: Secondary | ICD-10-CM | POA: Diagnosis present

## 2020-01-20 DIAGNOSIS — R2689 Other abnormalities of gait and mobility: Secondary | ICD-10-CM | POA: Diagnosis not present

## 2020-01-20 DIAGNOSIS — R0600 Dyspnea, unspecified: Secondary | ICD-10-CM

## 2020-01-20 DIAGNOSIS — R1930 Abdominal rigidity, unspecified site: Secondary | ICD-10-CM

## 2020-01-20 LAB — COMPREHENSIVE METABOLIC PANEL
ALT: 19 U/L (ref 0–44)
AST: 23 U/L (ref 15–41)
Albumin: 3.4 g/dL — ABNORMAL LOW (ref 3.5–5.0)
Alkaline Phosphatase: 143 U/L — ABNORMAL HIGH (ref 38–126)
Anion gap: 11 (ref 5–15)
BUN: 14 mg/dL (ref 8–23)
CO2: 26 mmol/L (ref 22–32)
Calcium: 9.2 mg/dL (ref 8.9–10.3)
Chloride: 104 mmol/L (ref 98–111)
Creatinine, Ser: 1.2 mg/dL (ref 0.61–1.24)
GFR, Estimated: 57 mL/min — ABNORMAL LOW (ref >60)
Glucose, Bld: 122 mg/dL — ABNORMAL HIGH (ref 70–99)
Potassium: 3.4 mmol/L — ABNORMAL LOW (ref 3.5–5.1)
Sodium: 141 mmol/L (ref 135–145)
Total Bilirubin: 1.3 mg/dL — ABNORMAL HIGH (ref 0.3–1.2)
Total Protein: 6.8 g/dL (ref 6.5–8.1)

## 2020-01-20 LAB — RESPIRATORY PANEL BY RT PCR (FLU A&B, COVID)
Influenza A by PCR: NEGATIVE
Influenza B by PCR: NEGATIVE
SARS Coronavirus 2 by RT PCR: NEGATIVE

## 2020-01-20 LAB — CBC WITH DIFFERENTIAL/PLATELET
Abs Immature Granulocytes: 0.05 10*3/uL (ref 0.00–0.07)
Basophils Absolute: 0.1 10*3/uL (ref 0.0–0.1)
Basophils Relative: 1 %
Eosinophils Absolute: 0.1 10*3/uL (ref 0.0–0.5)
Eosinophils Relative: 1 %
HCT: 45.1 % (ref 39.0–52.0)
Hemoglobin: 14.2 g/dL (ref 13.0–17.0)
Immature Granulocytes: 0 %
Lymphocytes Relative: 10 %
Lymphs Abs: 1.2 10*3/uL (ref 0.7–4.0)
MCH: 31.1 pg (ref 26.0–34.0)
MCHC: 31.5 g/dL (ref 30.0–36.0)
MCV: 98.7 fL (ref 80.0–100.0)
Monocytes Absolute: 1.4 10*3/uL — ABNORMAL HIGH (ref 0.1–1.0)
Monocytes Relative: 11 %
Neutro Abs: 9.6 10*3/uL — ABNORMAL HIGH (ref 1.7–7.7)
Neutrophils Relative %: 77 %
Platelets: 318 10*3/uL (ref 150–400)
RBC: 4.57 MIL/uL (ref 4.22–5.81)
RDW: 12.9 % (ref 11.5–15.5)
WBC: 12.4 10*3/uL — ABNORMAL HIGH (ref 4.0–10.5)
nRBC: 0 % (ref 0.0–0.2)

## 2020-01-20 LAB — CBG MONITORING, ED: Glucose-Capillary: 120 mg/dL — ABNORMAL HIGH (ref 70–99)

## 2020-01-20 LAB — URINALYSIS, ROUTINE W REFLEX MICROSCOPIC
Bacteria, UA: NONE SEEN
Bilirubin Urine: NEGATIVE
Glucose, UA: NEGATIVE mg/dL
Ketones, ur: 5 mg/dL — AB
Leukocytes,Ua: NEGATIVE
Nitrite: NEGATIVE
Protein, ur: NEGATIVE mg/dL
Specific Gravity, Urine: 1.016 (ref 1.005–1.030)
pH: 6 (ref 5.0–8.0)

## 2020-01-20 MED ORDER — SODIUM CHLORIDE 0.9 % IV SOLN: 250.0000 mL | INTRAVENOUS | Status: DC | PRN

## 2020-01-20 MED ORDER — LOSARTAN POTASSIUM 50 MG PO TABS
50.0000 mg | ORAL_TABLET | Freq: Every day | ORAL | Status: DC
  Administered 2020-01-20 – 2020-01-22 (×3): 50 mg via ORAL
  Filled 2020-01-20 (×3): qty 1

## 2020-01-20 MED ORDER — HALOPERIDOL LACTATE 5 MG/ML IJ SOLN
5.0000 mg | Freq: Once | INTRAMUSCULAR | Status: AC
  Administered 2020-01-20: 5 mg via INTRAVENOUS
  Filled 2020-01-20: qty 1

## 2020-01-20 MED ORDER — HALOPERIDOL LACTATE 5 MG/ML IJ SOLN
2.0000 mg | Freq: Four times a day (QID) | INTRAMUSCULAR | Status: DC | PRN
  Administered 2020-01-20: 2 mg via INTRAVENOUS
  Filled 2020-01-20: qty 1

## 2020-01-20 MED ORDER — HYDROCODONE-ACETAMINOPHEN 5-325 MG PO TABS
1.0000 | ORAL_TABLET | Freq: Once | ORAL | Status: AC
  Administered 2020-01-20: 1 via ORAL
  Filled 2020-01-20: qty 1

## 2020-01-20 MED ORDER — HALOPERIDOL LACTATE 5 MG/ML IJ SOLN
5.0000 mg | Freq: Four times a day (QID) | INTRAMUSCULAR | Status: DC | PRN
  Administered 2020-01-20 – 2020-01-31 (×6): 5 mg via INTRAVENOUS
  Filled 2020-01-20 (×7): qty 1

## 2020-01-20 MED ORDER — FINASTERIDE 5 MG PO TABS
5.0000 mg | ORAL_TABLET | Freq: Every day | ORAL | Status: DC
  Administered 2020-01-20 – 2020-02-05 (×16): 5 mg via ORAL
  Filled 2020-01-20 (×16): qty 1

## 2020-01-20 MED ORDER — LEVOTHYROXINE SODIUM 25 MCG PO TABS
25.0000 ug | ORAL_TABLET | Freq: Every day | ORAL | Status: DC
  Administered 2020-01-23 – 2020-02-05 (×13): 25 ug via ORAL
  Filled 2020-01-20 (×12): qty 1

## 2020-01-20 MED ORDER — ONDANSETRON HCL 4 MG PO TABS: 4.0000 mg | ORAL_TABLET | Freq: Four times a day (QID) | ORAL | Status: DC | PRN

## 2020-01-20 MED ORDER — ENOXAPARIN SODIUM 40 MG/0.4ML ~~LOC~~ SOLN
40.0000 mg | SUBCUTANEOUS | Status: DC
  Administered 2020-01-22 – 2020-02-02 (×12): 40 mg via SUBCUTANEOUS
  Filled 2020-01-20 (×12): qty 0.4

## 2020-01-20 MED ORDER — ONDANSETRON HCL 4 MG/2ML IJ SOLN: 4.0000 mg | Freq: Four times a day (QID) | INTRAMUSCULAR | Status: DC | PRN

## 2020-01-20 MED ORDER — ACETAMINOPHEN 650 MG RE SUPP: 650.0000 mg | Freq: Four times a day (QID) | RECTAL | Status: DC | PRN

## 2020-01-20 MED ORDER — MEMANTINE HCL 10 MG PO TABS
10.0000 mg | ORAL_TABLET | Freq: Two times a day (BID) | ORAL | Status: DC
  Administered 2020-01-20 – 2020-02-05 (×29): 10 mg via ORAL
  Filled 2020-01-20 (×31): qty 1

## 2020-01-20 MED ORDER — SODIUM CHLORIDE 0.9% FLUSH
3.0000 mL | Freq: Two times a day (BID) | INTRAVENOUS | Status: DC
  Administered 2020-01-20 – 2020-02-05 (×20): 3 mL via INTRAVENOUS

## 2020-01-20 MED ORDER — SODIUM CHLORIDE 0.9% FLUSH: 3.0000 mL | INTRAVENOUS | Status: DC | PRN

## 2020-01-20 MED ORDER — LABETALOL HCL 5 MG/ML IV SOLN
10.0000 mg | INTRAVENOUS | Status: DC | PRN
  Administered 2020-01-20 – 2020-01-31 (×4): 10 mg via INTRAVENOUS
  Filled 2020-01-20 (×4): qty 4

## 2020-01-20 MED ORDER — RIVASTIGMINE TARTRATE 1.5 MG PO CAPS
3.0000 mg | ORAL_CAPSULE | Freq: Two times a day (BID) | ORAL | Status: DC
  Administered 2020-01-20 – 2020-02-05 (×29): 3 mg via ORAL
  Filled 2020-01-20 (×36): qty 2

## 2020-01-20 MED ORDER — TAMSULOSIN HCL 0.4 MG PO CAPS
0.4000 mg | ORAL_CAPSULE | Freq: Every day | ORAL | Status: DC
  Administered 2020-01-20 – 2020-02-05 (×16): 0.4 mg via ORAL
  Filled 2020-01-20 (×16): qty 1

## 2020-01-20 MED ORDER — SIMVASTATIN 20 MG PO TABS
20.0000 mg | ORAL_TABLET | Freq: Every day | ORAL | Status: DC
  Administered 2020-01-20 – 2020-02-04 (×15): 20 mg via ORAL
  Filled 2020-01-20 (×15): qty 1

## 2020-01-20 MED ORDER — SODIUM CHLORIDE 0.45 % IV SOLN: INTRAVENOUS | Status: DC

## 2020-01-20 MED ORDER — ASPIRIN EC 81 MG PO TBEC
81.0000 mg | DELAYED_RELEASE_TABLET | Freq: Every day | ORAL | Status: DC
  Administered 2020-01-20 – 2020-01-23 (×4): 81 mg via ORAL
  Filled 2020-01-20 (×5): qty 1

## 2020-01-20 MED ORDER — LORAZEPAM 2 MG/ML IJ SOLN
0.5000 mg | Freq: Once | INTRAMUSCULAR | Status: AC
  Administered 2020-01-20: 0.5 mg via INTRAVENOUS
  Filled 2020-01-20: qty 1

## 2020-01-20 MED ORDER — ALBUTEROL SULFATE HFA 108 (90 BASE) MCG/ACT IN AERS
2.0000 | INHALATION_SPRAY | Freq: Once | RESPIRATORY_TRACT | Status: AC
  Administered 2020-01-20: 2 via RESPIRATORY_TRACT
  Filled 2020-01-20: qty 6.7

## 2020-01-20 MED ORDER — ACETAMINOPHEN 325 MG PO TABS
650.0000 mg | ORAL_TABLET | Freq: Four times a day (QID) | ORAL | Status: DC | PRN
  Administered 2020-01-24: 650 mg via ORAL
  Filled 2020-01-20: qty 2

## 2020-01-20 NOTE — ED Notes (Signed)
Patient unable to follow commands at this time unable to administer PO medications sitter for safety at bedside

## 2020-01-20 NOTE — ED Notes (Signed)
Help with in and out cath on patient sitting with patient in room

## 2020-01-20 NOTE — Plan of Care (Signed)
  Problem: Health Behavior/Discharge Planning: Goal: Ability to manage health-related needs will improve Outcome: Not Progressing   Problem: Activity: Goal: Risk for activity intolerance will decrease Outcome: Not Progressing   Problem: Safety: Goal: Ability to remain free from injury will improve Outcome: Not Progressing   

## 2020-01-20 NOTE — ED Notes (Addendum)
RN notified of pt elevated blood pressure and pulse

## 2020-01-20 NOTE — ED Notes (Signed)
Pt becomingly increasingly agitated, pulling at medical equipement, confused, hitting at nursing staff, spitting at nursing staff, attempting to get out of bed, not open to redirection by nursing staff. This nurse notifed Mardella Layman PA of pt escalating behavior, charge nurse aware, to send sitter to ensure pt safety.

## 2020-01-20 NOTE — ED Provider Notes (Signed)
[facility] EMERGENCY DEPARTMENT Provider Note   CSN: [id number] Arrival date & time: 01/20/20  1145     History Chief Complaint  Patient presents with  . Altered Mental Status  . Fall    Shaun Moore is a 86 y.o. male past ocular back pain, CAD, hypertension, dementia brought in by EMS for evaluation of fall, increased confusion.  Patient with baseline history of dementia.  He lives at home with wife.  They report that at baseline, he is normally confused.  He can sometimes answer questions but is not very conversant.  They report that he had a fall about a week or so ago.  He hit the back of his head.  He did not seek evaluation for that fall.  They report that over the last week, they have noticed increasing behavior and feel that he has been declining.  They states he has not been as conversant, has been more agitated and confused than normal.  He also reports his falls have become more frequent.  He had 3 falls today starting at 2 AM.  One fall was unwitnessed.  They report that patient has been complaining of some lower back pain.  Son states that patient was at home with wife.  They deny any recent sickness, fevers, vomiting.  They report that he had recently gotten some hospice/home health to the house but they have not yet been there to evaluate.  EM LEVEL 5 CAVEAT DUE TO DEMENTIA   The history is provided by the patient.       Past Medical History:  Diagnosis Date  . Back pain   . BPH (benign prostatic hypertrophy)   . CAD (coronary artery disease)   . Cognitive impairment    dementia  . Esophageal reflux   . Essential hypertension, benign   . Headache    "pressure or heaviness in head"  . NSTEMI (non-ST elevated myocardial infarction) (HCC)   . OA (osteoarthritis)   . Pure hyperglyceridemia   . Rhinitis, allergic   . Vertigo     Patient Active Problem List   Diagnosis Date Noted  . AMS (altered mental status) 01/20/2020  . Frequent falls  01/20/2020  . Alzheimer's dementia with behavioral disturbance (HCC) 01/20/2020  . Fever 08/17/2017  . Generalized weakness 08/17/2017  . Late onset Alzheimer's disease without behavioral disturbance (HCC) 10/25/2014  . Coronary artery disease involving native coronary artery of native heart without angina pectoris 11/02/2013  . Pure hypercholesterolemia 11/02/2013  . Daytime somnolence 11/02/2013  . Essential hypertension, benign 11/02/2013    Past Surgical History:  Procedure Laterality Date  . CARDIAC CATHETERIZATION     DE stent RCA and LAD       Family History  Problem Relation Age of Onset  . CAD Father   . Hypertension Sister   . Cancer Sister        renal cell     Social History   Tobacco Use  . Smoking status: Never Smoker  . Smokeless tobacco: Never Used  Vaping Use  . Vaping Use: Never assessed  Substance Use Topics  . Alcohol use: No    Alcohol/week: 0.0 standard drinks  . Drug use: No    Home Medications Prior to Admission medications   Medication Sig Start Date End Date Taking? Authorizing Provider  aspirin 81 MG tablet Take 81 mg by mouth daily.   Yes [provider]  finasteride (PROSCAR) 5 MG tablet Take 5 mg by mouth  daily.  09/28/14  Yes [provider]  levothyroxine (SYNTHROID, LEVOTHROID) 25 MCG tablet Take 25 mcg by mouth daily before breakfast.  09/27/14  Yes [provider]  losartan (COZAAR) 50 MG tablet Take 50 mg by mouth daily.   Yes [provider]  memantine (NAMENDA) 10 MG tablet TAKE 1 TABLET BY MOUTH TWICE DAILY Patient taking differently: Take 10 mg by mouth 2 (two) times daily.  08/17/19  Yes Jaffe, Adam R, DO  rivastigmine (EXELON) 1.5 MG capsule TAKE 2 CAPSULES BY MOUTH TWICE DAILY Patient taking differently: Take 3 mg by mouth 2 (two) times daily.  07/29/19  Yes Jaffe, Adam R, DO  simvastatin (ZOCOR) 20 MG tablet Take 20 mg by mouth daily at 6 PM.   Yes [provider]  tamsulosin  (FLOMAX) 0.4 MG CAPS Take 0.4 mg by mouth daily.   Yes [provider]    Allergies    Ambien [zolpidem tartrate] and Lisinopril  Review of Systems   Review of Systems  Unable to perform ROS: Dementia    Physical Exam Updated Vital Signs BP (!) 159/85   Pulse (!) 102   Temp 99.3 F (37.4 C) (Oral)   Resp (!) 21   SpO2 95%   Physical Exam Vitals and nursing note reviewed.  Constitutional:      Appearance: Normal appearance. He is well-developed.  HENT:     Head: Normocephalic and atraumatic.      Comments: Small hematoma noted right posterior head.  No underlying skull deformity or crepitus noted. Eyes:     General: Lids are normal.     Conjunctiva/sclera: Conjunctivae normal.     Pupils: Pupils are equal, round, and reactive to light.     Comments: PERRL. EOMs intact. No nystagmus. No neglect.   Neck:     Comments: Full flexion/extension and lateral movement of neck fully intact. No bony midline tenderness. No deformities or crepitus.  Cardiovascular:     Rate and Rhythm: Normal rate and regular rhythm.     Pulses: Normal pulses.          Radial pulses are 2+ on the right side and 2+ on the left side.       Dorsalis pedis pulses are 2+ on the right side and 2+ on the left side.     Heart sounds: Normal heart sounds. No murmur heard.  No friction rub. No gallop.   Pulmonary:     Effort: Pulmonary effort is normal.     Breath sounds: Wheezing present.     Comments: Diffuse wheezing noted.  He has some audible wheezing noted.  No evidence of respiratory distress. Abdominal:     Palpations: Abdomen is soft. Abdomen is not rigid.     Tenderness: There is no abdominal tenderness. There is no guarding.     Comments: Abdomen is soft, non-distended, non-tender. No rigidity, No guarding. No peritoneal signs.  Musculoskeletal:        General: Normal range of motion.     Cervical back: Full passive range of motion without pain.       Back:     Comments: No midline  T spine tenderness.  Diffuse lower lumbar tenderness.  No deformity or crepitus noted.  Skin:    General: Skin is warm and dry.     Capillary Refill: Capillary refill takes less than 2 seconds.  Neurological:     Mental Status: He is alert and oriented to person, place, and time.  Comments: Follow some commands.  He is alert.  He can tell me his name but cannot answer many other questions.  He has 5/5 strength of bilateral upper extremities.  He has symmetric strength to bilateral lower extremities but states that that makes his back hurt worse.  CN III-XII intact.   Psychiatric:        Speech: Speech normal.     ED Results / Procedures / Treatments   Labs (all labs ordered are listed, but only abnormal results are displayed) Labs Reviewed  COMPREHENSIVE METABOLIC PANEL - Abnormal; Notable for the following components:      Result Value   Potassium 3.4 (*)    Glucose, Bld 122 (*)    Albumin 3.4 (*)    Alkaline Phosphatase 143 (*)    Total Bilirubin 1.3 (*)    GFR, Estimated 57 (*)    All other components within normal limits  CBC WITH DIFFERENTIAL/PLATELET - Abnormal; Notable for the following components:   WBC 12.4 (*)    Neutro Abs 9.6 (*)    Monocytes Absolute 1.4 (*)    All other components within normal limits  URINALYSIS, ROUTINE W REFLEX MICROSCOPIC - Abnormal; Notable for the following components:   Hgb urine dipstick MODERATE (*)    Ketones, ur 5 (*)    All other components within normal limits  CBG MONITORING, ED - Abnormal; Notable for the following components:   Glucose-Capillary 120 (*)    All other components within normal limits    EKG None  Radiology DG Chest 2 View  Result Date: 01/20/2020 CLINICAL DATA:  Pain generally in the back and chest. Patient unable to state specifically what is hurting. History of hypertension. EXAM: CHEST - 2 VIEW COMPARISON:  10/19/2018 FINDINGS: Heart is normal in size. No mediastinal or hilar masses. No evidence of  adenopathy. Mildly atelectasis above and elevated right hemidiaphragm, similar to the prior study. Lungs otherwise clear. No pleural effusion.  No pneumothorax. Skeletal structures are intact. IMPRESSION: No active cardiopulmonary disease. Electronically Signed   By: Amie Portland M.D.   On: 01/20/2020 13:15   DG Lumbar Spine Complete  Result Date: 01/20/2020 CLINICAL DATA:  Back pain. EXAM: LUMBAR SPINE - COMPLETE 4+ VIEW COMPARISON:  Lumbar MRI, 10/21/2006 FINDINGS: No fracture. No spondylolisthesis or and no bone lesion. There is dense material from prior vertebroplasty in the L3 vertebra. Minor loss of disc height at L2-L3-L3-L4. Mild loss of disc height at L4-L5. There are small endplate osteophytes. Mild facet degenerative change bilaterally at L4-L5 and L5-S1. Calcifications are scattered throughout the abdominal aorta. IMPRESSION: 1. No fracture or acute finding. 2. Previous L3 vertebroplasty. 3. Mild disc and facet degenerative changes as detailed. Electronically Signed   By: Amie Portland M.D.   On: 01/20/2020 13:14   CT Head Wo Contrast  Result Date: 01/20/2020 CLINICAL DATA:  Head trauma, minor. Additional history provided: Fall. EXAM: CT HEAD WITHOUT CONTRAST CT CERVICAL SPINE WITHOUT CONTRAST TECHNIQUE: Multidetector CT imaging of the head and cervical spine was performed following the standard protocol without intravenous contrast. Multiplanar CT image reconstructions of the cervical spine were also generated. COMPARISON:  Head CT 08/17/2017.  Brain MRI 02/28/2016. FINDINGS: CT HEAD FINDINGS Brain: The examination is motion degraded at the level of the lower cerebrum, skull base, orbits, maxillofacial structures and posterior fossa. Cerebral and cerebellar atrophy. Mild ill-defined hypoattenuation within the cerebral white matter is nonspecific, but compatible with chronic small vessel ischemic disease. There is no acute intracranial hemorrhage.  No demarcated cortical infarct. No extra-axial  fluid collection. No evidence of intracranial mass. No midline shift. Vascular: No hyperdense vessel.  Atherosclerotic calcifications. Skull: Normal. Negative for fracture or focal lesion. Sinuses/Orbits: Visualized orbits show no acute finding. No significant paranasal sinus disease at the imaged levels. CT CERVICAL SPINE FINDINGS Alignment: Straightening of the expected cervical lordosis. Trace C3-C4 grade 1 retrolisthesis. Skull base and vertebrae: The basion-dental and atlanto-dental intervals are maintained.No evidence of acute fracture to the cervical spine. Soft tissues and spinal canal: No prevertebral fluid or swelling. No visible canal hematoma. Disc levels: Cervical spondylosis with multilevel disc space narrowing, disc bulges and uncovertebral hypertrophy. Disc space narrowing is advanced at C3-C4, C4-C5, C5-C6 and C6-C7. Prominent multilevel ventrolateral osteophytes, most notably at C7-T1. Upper chest: No visible airspace consolidation or pneumothorax. Other: Atherosclerotic calcifications within the visualized aortic arch, proximal major branch vessels of the neck and carotid bifurcations. IMPRESSION: CT head: 1. Motion degraded examination as described. 2. No acute intracranial abnormality is identified. 3. Cerebral and cerebellar atrophy with mild chronic small vessel ischemic disease. CT cervical spine: 1. No evidence of acute fracture to the cervical spine. 2. Mild C3-C4 grade 1 retrolisthesis. 3. Cervical spondylosis as described. 4.  Aortic Atherosclerosis (ICD10-I70.0). Electronically Signed   By: Jackey Loge DO   On: 01/20/2020 13:27   CT Cervical Spine Wo Contrast  Result Date: 01/20/2020 CLINICAL DATA:  Head trauma, minor. Additional history provided: Fall. EXAM: CT HEAD WITHOUT CONTRAST CT CERVICAL SPINE WITHOUT CONTRAST TECHNIQUE: Multidetector CT imaging of the head and cervical spine was performed following the standard protocol without intravenous contrast. Multiplanar CT image  reconstructions of the cervical spine were also generated. COMPARISON:  Head CT 08/17/2017.  Brain MRI 02/28/2016. FINDINGS: CT HEAD FINDINGS Brain: The examination is motion degraded at the level of the lower cerebrum, skull base, orbits, maxillofacial structures and posterior fossa. Cerebral and cerebellar atrophy. Mild ill-defined hypoattenuation within the cerebral white matter is nonspecific, but compatible with chronic small vessel ischemic disease. There is no acute intracranial hemorrhage. No demarcated cortical infarct. No extra-axial fluid collection. No evidence of intracranial mass. No midline shift. Vascular: No hyperdense vessel.  Atherosclerotic calcifications. Skull: Normal. Negative for fracture or focal lesion. Sinuses/Orbits: Visualized orbits show no acute finding. No significant paranasal sinus disease at the imaged levels. CT CERVICAL SPINE FINDINGS Alignment: Straightening of the expected cervical lordosis. Trace C3-C4 grade 1 retrolisthesis. Skull base and vertebrae: The basion-dental and atlanto-dental intervals are maintained.No evidence of acute fracture to the cervical spine. Soft tissues and spinal canal: No prevertebral fluid or swelling. No visible canal hematoma. Disc levels: Cervical spondylosis with multilevel disc space narrowing, disc bulges and uncovertebral hypertrophy. Disc space narrowing is advanced at C3-C4, C4-C5, C5-C6 and C6-C7. Prominent multilevel ventrolateral osteophytes, most notably at C7-T1. Upper chest: No visible airspace consolidation or pneumothorax. Other: Atherosclerotic calcifications within the visualized aortic arch, proximal major branch vessels of the neck and carotid bifurcations. IMPRESSION: CT head: 1. Motion degraded examination as described. 2. No acute intracranial abnormality is identified. 3. Cerebral and cerebellar atrophy with mild chronic small vessel ischemic disease. CT cervical spine: 1. No evidence of acute fracture to the cervical spine.  2. Mild C3-C4 grade 1 retrolisthesis. 3. Cervical spondylosis as described. 4.  Aortic Atherosclerosis (ICD10-I70.0). Electronically Signed   By: Jackey Loge DO   On: 01/20/2020 13:27    Procedures Procedures (including critical care time)  Medications Ordered in ED Medications  albuterol (VENTOLIN HFA) 108 (90 Base)  MCG/ACT inhaler 2 puff (2 puffs Inhalation Refused 01/20/20 1249)  aspirin EC tablet 81 mg (has no administration in time range)  losartan (COZAAR) tablet 50 mg (has no administration in time range)  simvastatin (ZOCOR) tablet 20 mg (has no administration in time range)  memantine (NAMENDA) tablet 10 mg (has no administration in time range)  rivastigmine (EXELON) capsule 3 mg (has no administration in time range)  levothyroxine (SYNTHROID) tablet 25 mcg (has no administration in time range)  finasteride (PROSCAR) tablet 5 mg (has no administration in time range)  tamsulosin (FLOMAX) capsule 0.4 mg (has no administration in time range)  enoxaparin (LOVENOX) injection 40 mg (has no administration in time range)  sodium chloride flush (NS) 0.9 % injection 3 mL (has no administration in time range)  sodium chloride flush (NS) 0.9 % injection 3 mL (has no administration in time range)  0.9 %  sodium chloride infusion (has no administration in time range)  acetaminophen (TYLENOL) tablet 650 mg (has no administration in time range)    Or  acetaminophen (TYLENOL) suppository 650 mg (has no administration in time range)  ondansetron (ZOFRAN) tablet 4 mg (has no administration in time range)    Or  ondansetron (ZOFRAN) injection 4 mg (has no administration in time range)  haloperidol lactate (HALDOL) injection 2 mg (has no administration in time range)  HYDROcodone-acetaminophen (NORCO/VICODIN) 5-325 MG per tablet 1 tablet (1 tablet Oral Given 01/20/20 1341)  LORazepam (ATIVAN) injection 0.5 mg (0.5 mg Intravenous Given 01/20/20 1349)  haloperidol lactate (HALDOL) injection 5 mg (5  mg Intravenous Given 01/20/20 1403)    ED Course  I have reviewed the triage vital signs and the nursing notes.  Pertinent labs & imaging results that were available during my care of the patient were reviewed by me and considered in my medical decision making (see chart for details).    MDM Rules/Calculators/A&P                          86 year old male past medical history of dementia who presents for evaluation of increased confusion, falls.  Most of the history is provided by son.  They report at baseline, patient can answer some questions but is not very conversant.  They report he had a fall about a week ago but did not seek evaluation.  Since then, he has had increased confusion, agitation and has had more frequent falls.  They report 3 falls today.  No blood thinner use.  Onalee Hua states that he has not been sick recently.  On initially arrival, he is tachycardic, hypertensive.  Vitals otherwise stable.  On exam, he is alert and can answer some questions and follow some commands.  He has a hematoma noted to the right posterior aspect of his head as well as some pain into the lower lumbar region.  He does have diffuse wheezing noted.  Will obtain imaging of his head, neck, back as well as chest x-ray to ensure no infectious etiology.  We will plan to check labs for evaluation of increased confusion.  CMP shows potassium of 3.4.  BUN and creatinine are within normal limits.  CBC shows slight leukocytosis of 12.4.  CBG is 120. UA negative for infectious etioogy.   Lumbar spine x-ray shows no evidence of a fracture.  Chest x-ray shows no evidence of abnormalities.   Patient was becoming agitated in the ED.  He was attempting to get out of the bed, spitting and  nurses when attempting to help.  At this time, concern for his safety as he might fall from the bed so he was given Haldol.  I discussed at length with son.  He is concerned about the family's ability to take care of patient.  At this  time, patient is agitated and has difficulty walking.  At this time, do not feel safe and letting patient go home.  We discussed admission.  Additionally, we discussed about future plans.  They had been referred to home health by primary care doctor but at this time, they feel that they may not be able to take care of patient.  They are agreeable to rehab placement.  We will plan for admission for altered mental status, gait instability.  Discussed patient with Dr. Joylene Igo (hospitalist) who accepts patient for admission.   Portions of this note were generated with Scientist, clinical (histocompatibility and immunogenetics). Dictation errors may occur despite best attempts at proofreading.   Final Clinical Impression(s) / ED Diagnoses Final diagnoses:  Gait instability  Altered mental status, unspecified altered mental status type    Rx / DC Orders ED Discharge Orders    None       Maxwell Caul, PA-C 01/20/20 1553    Geoffery Lyons, MD 01/21/20 [phone]

## 2020-01-20 NOTE — ED Notes (Signed)
Patient restless grabbing at staff and IVs. Asking to be pulled up he is at the top of the bed will reach out to MD for suggestions.

## 2020-01-20 NOTE — ED Notes (Signed)
RN informed of pt elevated vital signs °

## 2020-01-20 NOTE — ED Notes (Signed)
Pt son at bedside.

## 2020-01-20 NOTE — H&P (Signed)
History and Physical    Shaun Moore ZOX:[id number] DOB: 1925-12-06 DOA: 01/20/2020  PCP: Lupita Raider, MD   Patient coming from: Home  I have personally briefly reviewed patient's old medical records in [facility]  Chief Complaint: Altered mental status                               Fall Most of the history was obtained from patient's son as he is unable to provide any history  HPI: Shaun Moore is a 86 y.o. male with medical history significant for Alzheimer's dementia, BPH, hypertension, GERD, back pain and osteoarthritis who was brought into the ER by EMS for evaluation of multiple falls over the last 12 hours and worsening confusion.  Patient lives at home with his wife who is his primary caregiver, his son and daughter-in-law also help with providing care for him at home.  His son who provides most of the history states that the patient fell about a week ago and struck his head on the ground but refused to be evaluated at that time.  Family has noticed that over the last 1 week he has become increasingly agitated, more confused and difficult to reorient.  His gait is unsteady and he has had multiple falls despite the use of his rolling walker.  After one of his falls on the day of admission patient started complaining of lower back pain and so EMS was called and he was brought to the emergency room. I am unable to do a review of systems on this patient. Labs show sodium 141, potassium 3.4, chloride 104, bicarb 26, glucose 122, BUN 14, creatinine 1.2, calcium 9.2, alkaline phosphatase 143, albumin 3.4, AST 23, ALT 19, total protein 6.8, white count 12.4, hemoglobin 14.2, hematocrit 45.1, MCV 98.7, RDW 12.9, platelet count 318 Urine analysis is sterile but shows 21-50 RBCs CT scan of the head without contrast shows cerebral and cerebellar atrophy with mild chronic small vessel ischemic disease CT scan of cervical spine shows no evidence of acute fracture to the cervical spine.  Mild C3  -C4 grade 1 retrolisthesis.  Cervical spondylosis. Chest x-ray reviewed by me shows no active cardiopulmonary disease Lumbar spine x-ray shows no fracture or acute findings.  Previous L3 vertebroplasty. Twelve-lead EKG shows sinus tachycardia with a right bundle branch block    ED Course: Patient is a 86 year old Caucasian male with a history of Alzheimer's dementia who was brought into the ER for evaluation of increasing agitation, difficulty with reorientation and multiple falls at home.  While in the ER patient was noted to be extremely agitated and kept attempting to get out of bed and was pulling on his leads.  It was difficult to reorient patient and so he received a dose of Haldol 5 mg IV x1.  During my evaluation he was lethargic but easily arousable admitted to have room air pulse oximetry of 85% that improved following oxygen supplementation at 2 L.  He will be admitted to the hospital for further evaluation.  Review of Systems: As per HPI otherwise 10 point review of systems negative.    Past Medical History:  Diagnosis Date  . Back pain   . BPH (benign prostatic hypertrophy)   . CAD (coronary artery disease)   . Cognitive impairment    dementia  . Esophageal reflux   . Essential hypertension, benign   . Headache    "pressure or heaviness in  head"  . NSTEMI (non-ST elevated myocardial infarction) (HCC)   . OA (osteoarthritis)   . Pure hyperglyceridemia   . Rhinitis, allergic   . Vertigo     Past Surgical History:  Procedure Laterality Date  . CARDIAC CATHETERIZATION     DE stent RCA and LAD     reports that he has never smoked. He has never used smokeless tobacco. He reports that he does not drink alcohol and does not use drugs.  Allergies  Allergen Reactions  . Ambien [Zolpidem Tartrate]     Stated that is "made him crazy"  . Lisinopril Cough    Family History  Problem Relation Age of Onset  . CAD Father   . Hypertension Sister   . Cancer Sister         renal cell      Prior to Admission medications   Medication Sig Start Date End Date Taking? Authorizing Provider  aspirin 81 MG tablet Take 81 mg by mouth daily.    [provider]  finasteride (PROSCAR) 5 MG tablet Take 5 mg by mouth daily.  09/28/14   [provider]  levothyroxine (SYNTHROID, LEVOTHROID) 25 MCG tablet Take 25 mcg by mouth daily before breakfast.  09/27/14   [provider]  losartan (COZAAR) 50 MG tablet Take 50 mg by mouth daily.    [provider]  memantine (NAMENDA) 10 MG tablet TAKE 1 TABLET BY MOUTH TWICE DAILY 08/17/19   Everlena Cooper, Adam R, DO  rivastigmine (EXELON) 1.5 MG capsule TAKE 2 CAPSULES BY MOUTH TWICE DAILY 07/29/19   Everlena Cooper, Adam R, DO  simvastatin (ZOCOR) 20 MG tablet Take 20 mg by mouth daily at 6 PM.    [provider]  tamsulosin (FLOMAX) 0.4 MG CAPS Take 0.4 mg by mouth daily.    [provider]    Physical Exam: Vitals:   01/20/20 1400 01/20/20 1500 01/20/20 1540 01/20/20 1545  BP:  (!) 159/85  (!) 177/76  Pulse: (!) 131 (!) 116 (!) 102 98  Resp: (!) 30 (!) 25 (!) 21 20  Temp:      TempSrc:      SpO2: 93%  95% 96%     Vitals:   01/20/20 1400 01/20/20 1500 01/20/20 1540 01/20/20 1545  BP:  (!) 159/85  (!) 177/76  Pulse: (!) 131 (!) 116 (!) 102 98  Resp: (!) 30 (!) 25 (!) 21 20  Temp:      TempSrc:      SpO2: 93%  95% 96%    Constitutional: NAD, lethargic but arousable Eyes: PERRL, lids and conjunctivae normal ENMT: Mucous membranes are moist.  Neck: normal, supple, no masses, no thyromegaly Respiratory: Bilateral air entry, faint wheezing, no crackles. Normal respiratory effort. No accessory muscle use.  Cardiovascular: Tachycardia, no murmurs / rubs / gallops. No extremity edema. 2+ pedal pulses. No carotid bruits.  Abdomen: no tenderness, no masses palpated. No hepatosplenomegaly. Bowel sounds positive.  Musculoskeletal: no clubbing / cyanosis. No joint deformity upper and lower  extremities.  Skin: no rashes, lesions, ulcers.  Neurologic: Unable to assess as patient was sleeping during my examination Psychiatric: Unable to assess   Labs on Admission: I have personally reviewed following labs and imaging studies  CBC: Recent Labs  Lab 01/20/20 1205  WBC 12.4*  NEUTROABS 9.6*  HGB 14.2  HCT 45.1  MCV 98.7  PLT 318   Basic Metabolic Panel: Recent Labs  Lab 01/20/20 1205  NA 141  K 3.4*  CL 104  CO2 26  GLUCOSE 122*  BUN 14  CREATININE 1.20  CALCIUM 9.2   GFR: CrCl cannot be calculated (Unknown ideal weight.). Liver Function Tests: Recent Labs  Lab 01/20/20 1205  AST 23  ALT 19  ALKPHOS 143*  BILITOT 1.3*  PROT 6.8  ALBUMIN 3.4*   No results for input(s): LIPASE, AMYLASE in the last 168 hours. No results for input(s): AMMONIA in the last 168 hours. Coagulation Profile: No results for input(s): INR, PROTIME in the last 168 hours. Cardiac Enzymes: No results for input(s): CKTOTAL, CKMB, CKMBINDEX, TROPONINI in the last 168 hours. BNP (last 3 results) No results for input(s): PROBNP in the last 8760 hours. HbA1C: No results for input(s): HGBA1C in the last 72 hours. CBG: Recent Labs  Lab 01/20/20 1203  GLUCAP 120*   Lipid Profile: No results for input(s): CHOL, HDL, LDLCALC, TRIG, CHOLHDL, LDLDIRECT in the last 72 hours. Thyroid Function Tests: No results for input(s): TSH, T4TOTAL, FREET4, T3FREE, THYROIDAB in the last 72 hours. Anemia Panel: No results for input(s): VITAMINB12, FOLATE, FERRITIN, TIBC, IRON, RETICCTPCT in the last 72 hours. Urine analysis:    Component Value Date/Time   COLORURINE YELLOW 01/20/2020 1414   APPEARANCEUR CLEAR 01/20/2020 1414   LABSPEC 1.016 01/20/2020 1414   PHURINE 6.0 01/20/2020 1414   GLUCOSEU NEGATIVE 01/20/2020 1414   HGBUR MODERATE (A) 01/20/2020 1414   BILIRUBINUR NEGATIVE 01/20/2020 1414   KETONESUR 5 (A) 01/20/2020 1414   PROTEINUR NEGATIVE 01/20/2020 1414   UROBILINOGEN 0.2  11/09/2006 2014   NITRITE NEGATIVE 01/20/2020 1414   LEUKOCYTESUR NEGATIVE 01/20/2020 1414    Radiological Exams on Admission: DG Chest 2 View  Result Date: 01/20/2020 CLINICAL DATA:  Pain generally in the back and chest. Patient unable to state specifically what is hurting. History of hypertension. EXAM: CHEST - 2 VIEW COMPARISON:  10/19/2018 FINDINGS: Heart is normal in size. No mediastinal or hilar masses. No evidence of adenopathy. Mildly atelectasis above and elevated right hemidiaphragm, similar to the prior study. Lungs otherwise clear. No pleural effusion.  No pneumothorax. Skeletal structures are intact. IMPRESSION: No active cardiopulmonary disease. Electronically Signed   By: Amie Portland M.D.   On: 01/20/2020 13:15   DG Lumbar Spine Complete  Result Date: 01/20/2020 CLINICAL DATA:  Back pain. EXAM: LUMBAR SPINE - COMPLETE 4+ VIEW COMPARISON:  Lumbar MRI, 10/21/2006 FINDINGS: No fracture. No spondylolisthesis or and no bone lesion. There is dense material from prior vertebroplasty in the L3 vertebra. Minor loss of disc height at L2-L3-L3-L4. Mild loss of disc height at L4-L5. There are small endplate osteophytes. Mild facet degenerative change bilaterally at L4-L5 and L5-S1. Calcifications are scattered throughout the abdominal aorta. IMPRESSION: 1. No fracture or acute finding. 2. Previous L3 vertebroplasty. 3. Mild disc and facet degenerative changes as detailed. Electronically Signed   By: Amie Portland M.D.   On: 01/20/2020 13:14   CT Head Wo Contrast  Result Date: 01/20/2020 CLINICAL DATA:  Head trauma, minor. Additional history provided: Fall. EXAM: CT HEAD WITHOUT CONTRAST CT CERVICAL SPINE WITHOUT CONTRAST TECHNIQUE: Multidetector CT imaging of the head and cervical spine was performed following the standard protocol without intravenous contrast. Multiplanar CT image reconstructions of the cervical spine were also generated. COMPARISON:  Head CT 08/17/2017.  Brain MRI  02/28/2016. FINDINGS: CT HEAD FINDINGS Brain: The examination is motion degraded at the level of the lower cerebrum, skull base, orbits, maxillofacial structures and posterior fossa. Cerebral and cerebellar atrophy. Mild ill-defined hypoattenuation within the  cerebral white matter is nonspecific, but compatible with chronic small vessel ischemic disease. There is no acute intracranial hemorrhage. No demarcated cortical infarct. No extra-axial fluid collection. No evidence of intracranial mass. No midline shift. Vascular: No hyperdense vessel.  Atherosclerotic calcifications. Skull: Normal. Negative for fracture or focal lesion. Sinuses/Orbits: Visualized orbits show no acute finding. No significant paranasal sinus disease at the imaged levels. CT CERVICAL SPINE FINDINGS Alignment: Straightening of the expected cervical lordosis. Trace C3-C4 grade 1 retrolisthesis. Skull base and vertebrae: The basion-dental and atlanto-dental intervals are maintained.No evidence of acute fracture to the cervical spine. Soft tissues and spinal canal: No prevertebral fluid or swelling. No visible canal hematoma. Disc levels: Cervical spondylosis with multilevel disc space narrowing, disc bulges and uncovertebral hypertrophy. Disc space narrowing is advanced at C3-C4, C4-C5, C5-C6 and C6-C7. Prominent multilevel ventrolateral osteophytes, most notably at C7-T1. Upper chest: No visible airspace consolidation or pneumothorax. Other: Atherosclerotic calcifications within the visualized aortic arch, proximal major branch vessels of the neck and carotid bifurcations. IMPRESSION: CT head: 1. Motion degraded examination as described. 2. No acute intracranial abnormality is identified. 3. Cerebral and cerebellar atrophy with mild chronic small vessel ischemic disease. CT cervical spine: 1. No evidence of acute fracture to the cervical spine. 2. Mild C3-C4 grade 1 retrolisthesis. 3. Cervical spondylosis as described. 4.  Aortic Atherosclerosis  (ICD10-I70.0). Electronically Signed   By: Jackey Loge DO   On: 01/20/2020 13:27   CT Cervical Spine Wo Contrast  Result Date: 01/20/2020 CLINICAL DATA:  Head trauma, minor. Additional history provided: Fall. EXAM: CT HEAD WITHOUT CONTRAST CT CERVICAL SPINE WITHOUT CONTRAST TECHNIQUE: Multidetector CT imaging of the head and cervical spine was performed following the standard protocol without intravenous contrast. Multiplanar CT image reconstructions of the cervical spine were also generated. COMPARISON:  Head CT 08/17/2017.  Brain MRI 02/28/2016. FINDINGS: CT HEAD FINDINGS Brain: The examination is motion degraded at the level of the lower cerebrum, skull base, orbits, maxillofacial structures and posterior fossa. Cerebral and cerebellar atrophy. Mild ill-defined hypoattenuation within the cerebral white matter is nonspecific, but compatible with chronic small vessel ischemic disease. There is no acute intracranial hemorrhage. No demarcated cortical infarct. No extra-axial fluid collection. No evidence of intracranial mass. No midline shift. Vascular: No hyperdense vessel.  Atherosclerotic calcifications. Skull: Normal. Negative for fracture or focal lesion. Sinuses/Orbits: Visualized orbits show no acute finding. No significant paranasal sinus disease at the imaged levels. CT CERVICAL SPINE FINDINGS Alignment: Straightening of the expected cervical lordosis. Trace C3-C4 grade 1 retrolisthesis. Skull base and vertebrae: The basion-dental and atlanto-dental intervals are maintained.No evidence of acute fracture to the cervical spine. Soft tissues and spinal canal: No prevertebral fluid or swelling. No visible canal hematoma. Disc levels: Cervical spondylosis with multilevel disc space narrowing, disc bulges and uncovertebral hypertrophy. Disc space narrowing is advanced at C3-C4, C4-C5, C5-C6 and C6-C7. Prominent multilevel ventrolateral osteophytes, most notably at C7-T1. Upper chest: No visible airspace  consolidation or pneumothorax. Other: Atherosclerotic calcifications within the visualized aortic arch, proximal major branch vessels of the neck and carotid bifurcations. IMPRESSION: CT head: 1. Motion degraded examination as described. 2. No acute intracranial abnormality is identified. 3. Cerebral and cerebellar atrophy with mild chronic small vessel ischemic disease. CT cervical spine: 1. No evidence of acute fracture to the cervical spine. 2. Mild C3-C4 grade 1 retrolisthesis. 3. Cervical spondylosis as described. 4.  Aortic Atherosclerosis (ICD10-I70.0). Electronically Signed   By: Jackey Loge DO   On: 01/20/2020 13:27    EKG:  Independently reviewed.  Sinus tachycardia Right bundle branch block  Assessment/Plan Principal Problem:   AMS (altered mental status) Active Problems:   Coronary artery disease involving native coronary artery of native heart without angina pectoris   Essential hypertension, benign   Frequent falls   Alzheimer's dementia with behavioral disturbance (HCC)   Rhabdomyolysis     Altered mental status Most likely secondary to worsening Alzheimer's dementia Patient was brought in for evaluation of behavioral changes as evidenced by agitation, difficulty with reorientation and multiple falls at home He does not have any evidence of an active infection at this time and has not had any medication changes We will continue Haldol as needed for agitation Patient will need a safety sitter to reduce risk for fall Place patient on fall precautions    Rhabdomyolysis Total CK level still pending But urine analysis shows multiple RBCs Gentle IV fluid hydration Follow-up results of total CK levels   Alzheimer's dementia Continue Exelon and Namenda   Hypothyroidism Continue Synthroid   History of BPH Continue Flomax   Hypertension Continue Cozaar     DVT prophylaxis: Lovenox Code Status: DO NOT RESUSCITATE Family Communication: Greater than 50% of  time was spent discussing patient's condition and plan of care with his son Kenney Housemanerry Muldrow.  All questions and concerns have been addressed.  He verbalizes understanding and agrees with the plan.  CODE STATUS was discussed and patient is a DO NOT RESUSCITATE Disposition Plan: Back to previous home environment Consults called: None    Tedd Cottrill MD [facility]     01/20/2020, 4:04 PM

## 2020-01-20 NOTE — ED Notes (Signed)
Pt transported to xray 

## 2020-01-20 NOTE — ED Triage Notes (Addendum)
Pt to ED via EMS from home c/o AMS, increased weakness over the past several weeks, increasingly getting worse. Apparently pt fell one week ago and hit head on night stand; did not seek treatment, small hematoma to right back of head. Over the past couple of days pt is having multiple falls, no LOC, not on blood thinners.  pt had an incontinence episode this morning, which is not normal to patient. Pt has a history of dementia. Pt able to follow commands. No medications given by EMS. Last VS: 180/100, 98.0, RR 20, CBG 149. Medical HX:HTN

## 2020-01-20 NOTE — Telephone Encounter (Signed)
Spoke with patient's wife to schedule a Palliative care consult. Wife [name] reported that patient has drastically declined the past week and the past 3-4 days have been much worse. She reports patient has fallen multiple times, very weak, and restless.   Spoke with NP Talbert Forest to see when the soonest she would be able to see patient. Talbert Forest stated she would work patient in on 01/21/20 at 12 noon.   Called patient's home back. The son [name] answered and stated they had placed a call to patient's PCP. Family was about to call EMS to transport patient to [facility] for evaluation. Patient fell during the night and again this morning. Patient has loss of appetite the past 3-4 days, no communication and unable to move LE without help.

## 2020-01-21 ENCOUNTER — Inpatient Hospital Stay (HOSPITAL_COMMUNITY): Payer: Medicare Other

## 2020-01-21 ENCOUNTER — Encounter (HOSPITAL_COMMUNITY): Payer: Self-pay | Admitting: Internal Medicine

## 2020-01-21 DIAGNOSIS — R4182 Altered mental status, unspecified: Secondary | ICD-10-CM | POA: Diagnosis not present

## 2020-01-21 LAB — BASIC METABOLIC PANEL
Anion gap: 13 (ref 5–15)
BUN: 16 mg/dL (ref 8–23)
CO2: 25 mmol/L (ref 22–32)
Calcium: 8.7 mg/dL — ABNORMAL LOW (ref 8.9–10.3)
Chloride: 102 mmol/L (ref 98–111)
Creatinine, Ser: 1.17 mg/dL (ref 0.61–1.24)
GFR, Estimated: 58 mL/min — ABNORMAL LOW (ref >60)
Glucose, Bld: 152 mg/dL — ABNORMAL HIGH (ref 70–99)
Potassium: 3 mmol/L — ABNORMAL LOW (ref 3.5–5.1)
Sodium: 140 mmol/L (ref 135–145)

## 2020-01-21 LAB — URINALYSIS, MICROSCOPIC (REFLEX)

## 2020-01-21 LAB — CBC
HCT: 41.7 % (ref 39.0–52.0)
Hemoglobin: 13.4 g/dL (ref 13.0–17.0)
MCH: 30.9 pg (ref 26.0–34.0)
MCHC: 32.1 g/dL (ref 30.0–36.0)
MCV: 96.1 fL (ref 80.0–100.0)
Platelets: 289 10*3/uL (ref 150–400)
RBC: 4.34 MIL/uL (ref 4.22–5.81)
RDW: 12.8 % (ref 11.5–15.5)
WBC: 16.5 10*3/uL — ABNORMAL HIGH (ref 4.0–10.5)
nRBC: 0 % (ref 0.0–0.2)

## 2020-01-21 LAB — URINALYSIS, ROUTINE W REFLEX MICROSCOPIC

## 2020-01-21 LAB — CK TOTAL AND CKMB (NOT AT ARMC)
CK, MB: 6.3 ng/mL — ABNORMAL HIGH (ref 0.5–5.0)
Relative Index: 1.8 (ref 0.0–2.5)
Total CK: 358 U/L (ref 49–397)

## 2020-01-21 MED ORDER — IPRATROPIUM-ALBUTEROL 0.5-2.5 (3) MG/3ML IN SOLN
3.0000 mL | Freq: Four times a day (QID) | RESPIRATORY_TRACT | Status: DC | PRN
  Administered 2020-01-23 – 2020-01-25 (×3): 3 mL via RESPIRATORY_TRACT
  Filled 2020-01-21 (×3): qty 3

## 2020-01-21 MED ORDER — FUROSEMIDE 10 MG/ML IJ SOLN
40.0000 mg | Freq: Once | INTRAMUSCULAR | Status: AC
  Administered 2020-01-21: 40 mg via INTRAVENOUS
  Filled 2020-01-21: qty 4

## 2020-01-21 MED ORDER — IOHEXOL 300 MG/ML  SOLN
100.0000 mL | Freq: Once | INTRAMUSCULAR | Status: AC | PRN
  Administered 2020-01-21: 100 mL via INTRAVENOUS

## 2020-01-21 MED ORDER — METHYLPREDNISOLONE SODIUM SUCC 125 MG IJ SOLR
125.0000 mg | Freq: Once | INTRAMUSCULAR | Status: AC
  Administered 2020-01-21: 125 mg via INTRAVENOUS
  Filled 2020-01-21: qty 2

## 2020-01-21 MED ORDER — QUETIAPINE FUMARATE 25 MG PO TABS
12.5000 mg | ORAL_TABLET | Freq: Two times a day (BID) | ORAL | Status: DC
  Administered 2020-01-21 – 2020-01-31 (×20): 12.5 mg via ORAL
  Filled 2020-01-21 (×21): qty 1

## 2020-01-21 NOTE — Progress Notes (Signed)
RN went in to assess pt. Pt still confused at this time, pt had blood in pants when RN and tech were changing pt into gown. Unsure of the source so- condom cath was placed to assess urine in drainage bag. Pt with small clots in urine and urine was bloody red. MD paged- no new orders at this time. Will continue to monitor.

## 2020-01-21 NOTE — Progress Notes (Signed)
PT Cancellation Note  Patient Details Name: Shaun Moore MRN: [id number] DOB: [birth year]   Cancelled Treatment:    Reason Eval/Treat Not Completed: Other (comment) Per NT, pt has been restless and has just gotten settled. Will hold until pt appropriate and follow up as schedule allows.   Cindee Salt, DPT  Acute Rehabilitation Services  Pager: [phone] Office: [phone]    [name] 01/21/2020, 11:16 AM

## 2020-01-21 NOTE — Progress Notes (Signed)
Patent examiner [facility])  Hospital Liaison: RN note           This patient has been referred to our palliative care services in the community but has not had his initial visit.  [facility] will continue to follow for any discharge planning needs and to coordinate continuation of palliative care in the outpatient setting.      If you have questions or need assistance, please call [phone] or contact the hospital Liaison listed on AMION.        Thank you for this referral.           Elsie Saas, RN, CCM   [facility] Liaison (listed on AMION under Hospice[facility])     [phone]

## 2020-01-21 NOTE — Progress Notes (Signed)
PROGRESS NOTE  Shaun Moore  DOB: September 29, 1925  PCP: Shaun Raider, MD GXQ:[id number]  DOA: 01/20/2020  LOS: 1 day   Chief Complaint  Patient presents with  . Altered Mental Status  . Fall   Brief narrative: Shaun Moore is a 86 y.o. male who was brought to the ED on 01/20/2020 for multiple falls, altered mental status.  PMH significant for Alzheimer's dementia, BPH, hypertension, GERD, back pain and osteoarthritis  Patient lives at home with his wife, son and daughter-in-law.  Patient fell about a week ago and struck his head on the ground but refused to be evaluated at the time.  Over the last 1 week, he has become increasingly agitated, more confused and difficult to reorient.  His gait is unsteady and he has had multiple falls despite the use of rolling walker. After one of his falls on the day of admission, patient started complaining of lower back pain and so EMS was called and he was brought to the ED.  In the ED, patient was tachycardic up to 110, blood pressure elevated up to 240/181 Labs mostly unremarkable except for slight rising WBC 12.4 and potassium low at 3.4 CT had showed cerebral and cerebellar atrophy with mild chronic small vessel ischemic disease CT cervical spine did not show any evidence of acute fracture to the cervical spine.  Mild C3 -C4 grade 1 retrolisthesis.  Cervical spondylosis. Chest x-ray did not show any active cardiopulmonary disease Lumbar spine x-ray shows no fracture or acute findings.  Previous L3 vertebroplasty. Twelve-lead EKG shows sinus tachycardia with a right bundle branch block, QTC 468 ms.  While in the ED, patient was extremely agitated and kept attempting to get out of bed.  He was given Haldol 5 mg IV 1 time.  Oxygen saturation dropped to 85% and he was started on 2 L by nasal cannula.   Admitted to hospital service for further evaluation management.    Subjective: Patient was seen and examined this morning. Alert, awake, confused, sitter  at bedside. Wheezing.  Tachypneic. Has rigid abdomen but nontender Concentrated urine in condom catheter. Chart reviewed. Tachycardia improved, blood pressure remains elevated mostly in 170s, on 2 L by nasal cannula. Labs this morning with potassium further low at 3, WBC count up to 16.5  Assessment/Plan: Acute metabolic encephalopathy -In the setting of Alzheimer's dementia and multiple recent falls  -progressively worsening mental status for a week. -no evidence of UTI. -Delirium precautions.  Haldol IV as needed -Start on Seroquel 12.5 mg twice daily -Continue Exelon and Namenda for dementia.  Acute respiratory failure with hypoxia -Patient is wheezing, tachypneic on 2 l oxygen by nasal cannula to maintain saturation. -Patient has expiratory wheezing on auscultation. -Chest x-ray on admission was clear. -Stop IV fluid.  I will give him Lasix 40 mg 1 time stat, Solu-Medrol 125 mg one-time stat and duo nebs  Rigid abdomen -Nontender however. -May have underlying constipation.  I will get up bedside x-ray.. -Concentrated urine in Urobag.  Obtain urinalysis to rule out hematuria.  Hypothyroidism -Continue Synthroid  History of BPH -Continue Flomax and Proscar  Hypertension -Continue Cozaar  Mobility: PT eval ordered Code Status:   Code Status: DNR  Nutritional status: Body mass index is 27.62 kg/m.     Diet Order            Diet 2 gram sodium Room service appropriate? Yes; Fluid consistency: Thin  Diet effective now  DVT prophylaxis: enoxaparin (LOVENOX) injection 40 mg Start: 01/20/20 1545   Antimicrobials:  None Fluid: Stop IV fluid Consultants: None Family Communication:  None at bedside  Status is: Inpatient  Remains inpatient appropriate because:Altered mental status   Dispo: The patient is from: Home              Anticipated d/c is to: Home versus SNF              Anticipated d/c date is: > 3 days              Patient  currently is not medically stable to d/c.       Infusions:  . sodium chloride      Scheduled Meds: . aspirin EC  81 mg Oral Daily  . enoxaparin (LOVENOX) injection  40 mg Subcutaneous Q24H  . finasteride  5 mg Oral Daily  . furosemide  40 mg Intravenous Once  . levothyroxine  25 mcg Oral QAC breakfast  . losartan  50 mg Oral Daily  . memantine  10 mg Oral BID  . methylPREDNISolone (SOLU-MEDROL) injection  125 mg Intravenous Once  . QUEtiapine  12.5 mg Oral BID  . rivastigmine  3 mg Oral BID  . simvastatin  20 mg Oral q1800  . sodium chloride flush  3 mL Intravenous Q12H  . tamsulosin  0.4 mg Oral Daily    Antimicrobials: Anti-infectives (From admission, onward)   None      PRN meds: sodium chloride, acetaminophen **OR** acetaminophen, haloperidol lactate, ipratropium-albuterol, labetalol, ondansetron **OR** ondansetron (ZOFRAN) IV, sodium chloride flush   Objective: Vitals:   01/21/20 0500 01/21/20 0715  BP: (!) 170/86 (!) 175/84  Pulse:  97  Resp:  16  Temp:  97.6 F (36.4 C)  SpO2:  98%    Intake/Output Summary (Last 24 hours) at 01/21/2020 1021 Last data filed at 01/21/2020 0900 Gross per 24 hour  Intake 671.25 ml  Output 434 ml  Net 237.25 ml   Filed Weights   01/20/20 2246  Weight: 87.3 kg   Weight change:  Body mass index is 27.62 kg/m.   Physical Exam: General exam: Seems restless, clearly wheezing Skin: No rashes, lesions or ulcers. HEENT: Atraumatic, normocephalic, supple neck, no obvious bleeding Lungs: Tachypneic, auditory wheezing per outside, and expiratory to auscultation no crackles CVS: Regular rate and rhythm, no murmur GI/Abd -rigid, nontender, bowel sound present  CNS: Alert, awake, unable to answer questions, seems anxious, seems disoriented Psychiatry: Depressed look Extremities: No pedal edema, no calf tenderness  Data Review: I have personally reviewed the laboratory data and studies available.  Recent Labs  Lab  01/20/20 1205 01/21/20 0311  WBC 12.4* 16.5*  NEUTROABS 9.6*  --   HGB 14.2 13.4  HCT 45.1 41.7  MCV 98.7 96.1  PLT 318 289   Recent Labs  Lab 01/20/20 1205 01/21/20 0311  NA 141 140  K 3.4* 3.0*  CL 104 102  CO2 26 25  GLUCOSE 122* 152*  BUN 14 16  CREATININE 1.20 1.17  CALCIUM 9.2 8.7*    F/u labs ordered  Signed, Lorin Glass, MD [facility] 01/21/2020

## 2020-01-22 DIAGNOSIS — R4182 Altered mental status, unspecified: Secondary | ICD-10-CM | POA: Diagnosis not present

## 2020-01-22 LAB — CBC WITH DIFFERENTIAL/PLATELET
Abs Immature Granulocytes: 0.08 10*3/uL — ABNORMAL HIGH (ref 0.00–0.07)
Basophils Absolute: 0 10*3/uL (ref 0.0–0.1)
Basophils Relative: 0 %
Eosinophils Absolute: 0 10*3/uL (ref 0.0–0.5)
Eosinophils Relative: 0 %
HCT: 43.4 % (ref 39.0–52.0)
Hemoglobin: 14.1 g/dL (ref 13.0–17.0)
Immature Granulocytes: 1 %
Lymphocytes Relative: 4 %
Lymphs Abs: 0.7 10*3/uL (ref 0.7–4.0)
MCH: 31.2 pg (ref 26.0–34.0)
MCHC: 32.5 g/dL (ref 30.0–36.0)
MCV: 96 fL (ref 80.0–100.0)
Monocytes Absolute: 0.7 10*3/uL (ref 0.1–1.0)
Monocytes Relative: 4 %
Neutro Abs: 15.9 10*3/uL — ABNORMAL HIGH (ref 1.7–7.7)
Neutrophils Relative %: 91 %
Platelets: 308 10*3/uL (ref 150–400)
RBC: 4.52 MIL/uL (ref 4.22–5.81)
RDW: 12.9 % (ref 11.5–15.5)
WBC: 17.3 10*3/uL — ABNORMAL HIGH (ref 4.0–10.5)
nRBC: 0 % (ref 0.0–0.2)

## 2020-01-22 LAB — BASIC METABOLIC PANEL
Anion gap: 11 (ref 5–15)
BUN: 23 mg/dL (ref 8–23)
CO2: 28 mmol/L (ref 22–32)
Calcium: 8.8 mg/dL — ABNORMAL LOW (ref 8.9–10.3)
Chloride: 102 mmol/L (ref 98–111)
Creatinine, Ser: 1.37 mg/dL — ABNORMAL HIGH (ref 0.61–1.24)
GFR, Estimated: 48 mL/min — ABNORMAL LOW (ref >60)
Glucose, Bld: 198 mg/dL — ABNORMAL HIGH (ref 70–99)
Potassium: 3 mmol/L — ABNORMAL LOW (ref 3.5–5.1)
Sodium: 141 mmol/L (ref 135–145)

## 2020-01-22 MED ORDER — SODIUM CHLORIDE 0.9 % IV SOLN: INTRAVENOUS | Status: DC

## 2020-01-22 MED ORDER — HYDRALAZINE HCL 20 MG/ML IJ SOLN
10.0000 mg | Freq: Four times a day (QID) | INTRAMUSCULAR | Status: DC | PRN
  Administered 2020-01-23 – 2020-02-01 (×9): 10 mg via INTRAVENOUS
  Filled 2020-01-22 (×10): qty 1

## 2020-01-22 MED ORDER — POTASSIUM CHLORIDE 10 MEQ/100ML IV SOLN
10.0000 meq | INTRAVENOUS | Status: DC
  Administered 2020-01-22: 10 meq via INTRAVENOUS
  Filled 2020-01-22 (×2): qty 100

## 2020-01-22 MED ORDER — POTASSIUM CHLORIDE CRYS ER 20 MEQ PO TBCR
40.0000 meq | EXTENDED_RELEASE_TABLET | Freq: Once | ORAL | Status: DC
Start: 2020-01-22 — End: 2020-01-22

## 2020-01-22 MED ORDER — POTASSIUM CHLORIDE CRYS ER 20 MEQ PO TBCR
40.0000 meq | EXTENDED_RELEASE_TABLET | ORAL | Status: AC
  Administered 2020-01-22 (×2): 40 meq via ORAL
  Filled 2020-01-22 (×2): qty 2

## 2020-01-22 NOTE — Progress Notes (Signed)
   01/22/20 1100  Clinical Encounter Type  Visited With Patient  Visit Type Spiritual support  Referral From Nurse  Consult/Referral To Chaplain  Chaplain responded to consult. Chaplain offered prayer.This note was prepared by Deneen Harts, M.Div..  For questions please contact by phone [phone].

## 2020-01-22 NOTE — Progress Notes (Signed)
PROGRESS NOTE  Shaun Moore  DOB: 1925-11-12  PCP: Lupita Raider, MD SJG:[id number]  DOA: 01/20/2020  LOS: 2 days   Chief Complaint  Patient presents with  . Altered Mental Status  . Fall   Brief narrative: Shaun Moore is a 86 y.o. male who was brought to the ED on 01/20/2020 for multiple falls, altered mental status.  PMH significant for Alzheimer's dementia, BPH, hypertension, GERD, back pain and osteoarthritis  Patient lives at home with his wife, son and daughter-in-law.  Patient fell about a week ago and struck his head on the ground but refused to be evaluated at the time.  Over the last 1 week, he has become increasingly agitated, more confused and difficult to reorient.  His gait is unsteady and he has had multiple falls despite the use of rolling walker. After one of his falls on the day of admission, patient started complaining of lower back pain and so EMS was called and he was brought to the ED.  In the ED, patient was tachycardic up to 110, blood pressure elevated up to 240/181 Labs mostly unremarkable except for slight rising WBC 12.4 and potassium low at 3.4 CT had showed cerebral and cerebellar atrophy with mild chronic small vessel ischemic disease CT cervical spine did not show any evidence of acute fracture to the cervical spine.  Mild C3 -C4 grade 1 retrolisthesis.  Cervical spondylosis. Chest x-ray did not show any active cardiopulmonary disease Lumbar spine x-ray shows no fracture or acute findings.  Previous L3 vertebroplasty. Twelve-lead EKG shows sinus tachycardia with a right bundle branch block, QTC 468 ms.  While in the ED, patient was extremely agitated and kept attempting to get out of bed.  He was given Haldol 5 mg IV 1 time. Oxygen saturation dropped to 85% and he was started on 2 L by nasal cannula.   Admitted to hospital service for further evaluation management.    Subjective: Patient was seen and examined this morning. Alert, awake, looks lethargic.   Not in pain, looks uncomfortable though.  Oriented to place. Family not at bedside. Not wheezing today. Had 3 episodes of large liquid bowel movements this morning per RN. Pressure more than 160s, Labs this morning with potassium low at 3, creatinine rising to 1.37, WBC up to 17.3  Assessment/Plan: Acute metabolic encephalopathy -In the setting of Alzheimer's dementia and multiple recent falls  -progressively worsening mental status for a week. -no evidence of UTI. -Delirium precautions.  Haldol IV as needed -Currently on Seroquel 12.5 mg twice daily -Continue Exelon and Namenda for dementia.  Acute respiratory failure with hypoxia -11/18, patient was wheezing which improved with Lasix and IV steroids and holding IV hydration -Chest x-ray on admission was clear.   -Respiratory status stable at this time on 2 L by nasal cannula.  Colonic ileus Diarrhea -Nontender distended abdomen  -CT scan of abdomen showed colonic ileus.  Discussed with general surgery.  Recommended enema.  Patient however already had 3 liquid bowel movements overnight without use of enema.   -Continue to monitor.   -Because of dehydration related to diarrhea, I will start him on normal saline at 75 mill per hour.  Hypokalemia -Potassium low at 3 today.  Ordered for IV replacement. Recent Labs  Lab 01/20/20 1205 01/21/20 0311 01/22/20 0257  K 3.4* 3.0* 3.0*   Hypothyroidism -Continue Synthroid  History of BPH -Continue Flomax and Proscar  Hypertension -Hold Cozaar because of AKI.  Start on hydralazine IV PRN.  Mobility: PT eval ordered Code Status:   Code Status: DNR  Nutritional status: Body mass index is 27.62 kg/m.     Diet Order            Diet 2 gram sodium Room service appropriate? Yes; Fluid consistency: Thin  Diet effective now                 DVT prophylaxis: enoxaparin (LOVENOX) injection 40 mg Start: 01/20/20 1545   Antimicrobials:  None Fluid: NS at 75 mill per  hour. Consultants: None Family Communication:  None at bedside  Status is: Inpatient  Remains inpatient appropriate because:Altered mental status   Dispo: The patient is from: Home              Anticipated d/c is to: May be appropriate for residential hospice              Anticipated d/c date is: Whenever hospice bed is available              Patient currently is not medically stable to d/c.   Infusions:  . sodium chloride    . potassium chloride      Scheduled Meds: . aspirin EC  81 mg Oral Daily  . enoxaparin (LOVENOX) injection  40 mg Subcutaneous Q24H  . finasteride  5 mg Oral Daily  . levothyroxine  25 mcg Oral QAC breakfast  . memantine  10 mg Oral BID  . QUEtiapine  12.5 mg Oral BID  . rivastigmine  3 mg Oral BID  . simvastatin  20 mg Oral q1800  . sodium chloride flush  3 mL Intravenous Q12H  . tamsulosin  0.4 mg Oral Daily    Antimicrobials: Anti-infectives (From admission, onward)   None      PRN meds: sodium chloride, acetaminophen **OR** acetaminophen, haloperidol lactate, hydrALAZINE, ipratropium-albuterol, labetalol, ondansetron **OR** ondansetron (ZOFRAN) IV, sodium chloride flush   Objective: Vitals:   01/22/20 0853 01/22/20 0855  BP: (!) 172/94 (!) 171/91  Pulse: (!) 108 (!) 107  Resp:    Temp: (!) 97.5 F (36.4 C)   SpO2: 97% 97%    Intake/Output Summary (Last 24 hours) at 01/22/2020 1022 Last data filed at 01/21/2020 1800 Gross per 24 hour  Intake 120 ml  Output 900 ml  Net -780 ml   Filed Weights   01/20/20 2246  Weight: 87.3 kg   Weight change:  Body mass index is 27.62 kg/m.   Physical Exam: General exam: Not in physical discomfort Skin: No rashes, lesions or ulcers. HEENT: Atraumatic, normocephalic, supple neck, no obvious bleeding Lungs: Clear to auscultation bilaterally  CVS: Regular rate and rhythm, no murmur GI/Abd -soft, nontender, bowel sound present CNS: Alert, awake, knows he is in the hospital Psychiatry:  Depressed look Extremities: No pedal edema, no calf tenderness  Data Review: I have personally reviewed the laboratory data and studies available.  Recent Labs  Lab 01/20/20 1205 01/21/20 0311 01/22/20 0257  WBC 12.4* 16.5* 17.3*  NEUTROABS 9.6*  --  15.9*  HGB 14.2 13.4 14.1  HCT 45.1 41.7 43.4  MCV 98.7 96.1 96.0  PLT 318 289 308   Recent Labs  Lab 01/20/20 1205 01/21/20 0311 01/22/20 0257  NA 141 140 141  K 3.4* 3.0* 3.0*  CL 104 102 102  CO2 26 25 28   GLUCOSE 122* 152* 198*  BUN 14 16 23   CREATININE 1.20 1.17 1.37*  CALCIUM 9.2 8.7* 8.8*    F/u labs ordered  Signed, Chelsey Redondo,  MD [facility] 01/22/2020

## 2020-01-22 NOTE — Progress Notes (Signed)
PT Cancellation Note  Patient Details Name: Shaun Moore MRN: [id number] DOB: [birth year]   Cancelled Treatment:    Reason Eval/Treat Not Completed: Fatigue/lethargy limiting ability to participate pt asleep, attempted to arouse, sitter stated pt didn't sleep last night; will attempt in PM time permitting  Ginette Otto, DPT Acute Rehabilitation Services [phone]   Lucretia Field 01/22/2020, 12:17 PM

## 2020-01-22 NOTE — Evaluation (Signed)
Physical Therapy Evaluation Patient Details Name: Shaun Moore MRN: [id number] DOB: [birth year] Today's Date: 01/22/2020   History of Present Illness  86 y.o. male who was brought to the ED on 01/20/2020 for multiple falls, altered mental status.  PMH significant for Alzheimer's dementia, BPH, hypertension, GERD, back pain and OA. Patient fell about a week ago and struck his head on the ground but refused to be evaluated at the time.  Over the last 1 week, he has become increasingly agitated, more confused and difficult to reorient.  His gait is unsteady and he has had multiple falls despite the use of RW. Pt fell on 11/17 and was brought to the ED due to LBP.  Clinical Impression  Pt presents to PT with deficits in functional mobility, gait, balance, power, strength, endurance, cognition. Pt with impaired safety awareness and cognition, requiring re-orientation to place during session, and impulsively mobilizing at times despite significant falls risk. Pt demonstrates generalized LE weakness and a posterior lean during all standing activity despite use of RW for UE support. Pt needing physical assistance from PT at all times during standing to prevent fall. Pt will benefit from continued acute PT POC to reduce falls risk and improve balance. PT recommends SNF placement at this time.    Follow Up Recommendations SNF    Equipment Recommendations  Wheelchair (measurements PT);Hospital bed (if home today)    Recommendations for Other Services       Precautions / Restrictions Precautions Precautions: Fall Restrictions Weight Bearing Restrictions: No      Mobility  Bed Mobility Overal bed mobility: Needs Assistance Bed Mobility: Supine to Sit     Supine to sit: [place] assist     General bed mobility comments: pt requires assistance to mobilize LEs to edge of bed and UE support to pull into sitting    Transfers Overall transfer level: Needs assistance Equipment used: Rolling walker (2  wheeled);1 person hand held assist Transfers: Sit to/from [employer] Sit to Stand: Mod assist;Min assist Stand pivot transfers: Mod assist       General transfer comment: modA initially with PT HHA, minA with RW as PT holds RW down and pt elects to pull through RW into standing. Pt requires assistance during transfer due to posterior lean  Ambulation/Gait Ambulation/Gait assistance: Mod assist Gait Distance (Feet): 3 Feet Assistive device: Rolling walker (2 wheeled) Gait Pattern/deviations: Shuffle Gait velocity: reduced Gait velocity interpretation: <1.31 ft/sec, indicative of household ambulator General Gait Details: pt with short shuffling steps with posterior lean  Stairs            Wheelchair Mobility    Modified Rankin (Stroke Patients Only)       Balance Overall balance assessment: Needs assistance Sitting-balance support: Single extremity supported;Feet supported Sitting balance-[name] Scale: Poor Sitting balance - Comments: reliant on UE support of bed   Standing balance support: Bilateral upper extremity supported Standing balance-[name] Scale: Poor Standing balance comment: modA with BUE support of RW, posterior lean                             Pertinent Vitals/Pain Pain Assessment: No/denies pain    Home Living Family/patient expects to be discharged to:: Skilled nursing facility (family expects D/C to SNF) Living Arrangements: Spouse/significant other               Additional Comments: pt lives in one story house with spouse, owns RW, 1 STE without rails  Prior Function Level of Independence: Needs assistance   Gait / Transfers Assistance Needed: pt ambulating with a RW, many falls over the past few weeks           Hand Dominance        Extremity/Trunk Assessment   Upper Extremity Assessment Upper Extremity Assessment: Overall WFL for tasks assessed    Lower Extremity Assessment Lower Extremity  Assessment: Generalized weakness    Cervical / Trunk Assessment Cervical / Trunk Assessment: Kyphotic  Communication   Communication: HOH  Cognition Arousal/Alertness: Awake/alert Behavior During Therapy: Impulsive Overall Cognitive Status: Impaired/Different from baseline Area of Impairment: Orientation;Attention;Memory;Following commands;Safety/judgement;Awareness;Problem solving                 Orientation Level: Disoriented to;Place;Time;Situation Current Attention Level: Sustained Memory: Decreased recall of precautions;Decreased short-term memory Following Commands: Follows one step commands consistently Safety/Judgement: Decreased awareness of safety;Decreased awareness of deficits Awareness: Emergent Problem Solving: Slow processing        General Comments General comments (skin integrity, edema, etc.): pt on 2L [place] during session, VSS with sats in mid-90s    Exercises     Assessment/Plan    PT Assessment Patient needs continued PT services  PT Problem List Decreased strength;Decreased activity tolerance;Decreased balance;Decreased mobility;Decreased cognition;Decreased knowledge of use of DME;Decreased safety awareness;Decreased knowledge of precautions;Cardiopulmonary status limiting activity       PT Treatment Interventions DME instruction;Gait training;Functional mobility training;Therapeutic activities;Stair training;Balance training;Neuromuscular re-education;Cognitive remediation;Patient/family education    PT Goals (Current goals can be found in the Care Plan section)  Acute Rehab PT Goals Patient Stated Goal: Pt goal to go home, family to D/C to SNF PT Goal Formulation: With patient/family Time For Goal Achievement: 02/05/20 Potential to Achieve Goals: Fair    Frequency Min 2X/week   Barriers to discharge        Co-evaluation               AM-PAC PT "6 Clicks" Mobility  Outcome Measure Help needed turning from your back to your side  while in a flat bed without using bedrails?: A Lot Help needed moving from lying on your back to sitting on the side of a flat bed without using bedrails?: A Lot Help needed moving to and from a bed to a chair (including a wheelchair)?: A Lot Help needed standing up from a chair using your arms (e.g., wheelchair or bedside chair)?: A Little Help needed to walk in hospital room?: A Lot Help needed climbing 3-5 steps with a railing? : Total 6 Click Score: 12    End of Session Equipment Utilized During Treatment: Oxygen Activity Tolerance: Patient tolerated treatment well Patient left: in chair;with call bell/phone within reach;with nursing/sitter in room Nurse Communication: Mobility status PT Visit Diagnosis: Unsteadiness on feet (R26.81);Muscle weakness (generalized) (M62.81)    Time: 3235-5732 PT Time Calculation (min) (ACUTE ONLY): 21 min   Charges:   PT Evaluation $PT Eval Moderate Complexity: 1 Mod          Arlyss Gandy, PT, DPT Acute Rehabilitation Pager: [phone]   Arlyss Gandy 01/22/2020, 4:13 PM

## 2020-01-22 NOTE — Progress Notes (Signed)
Soap suds enema performed. 

## 2020-01-22 NOTE — [place] FL2 (Addendum)
[place] MEDICAID FL2 LEVEL OF CARE SCREENING TOOL     IDENTIFICATION  Patient Name: Shaun Moore Birthdate: 1925-10-20 Sex: male Admission Date (Current Location): 01/20/2020  [facility] and [place] Number:  Producer, television/film/video and Address:  [facility] [place], [address], [place], [place] [address]      Provider Number: [id number]  Attending Physician Name and Address:  Lorin Glass, MD  Relative Name and Phone Number:       Current Level of Care: Hospital Recommended Level of Care: Skilled Nursing Facility Prior Approval Number:    Date Approved/Denied:   PASRR Number:  [id number]  Discharge Plan: SNF    Current Diagnoses: Patient Active Problem List   Diagnosis Date Noted  . AMS (altered mental status) 01/20/2020  . Frequent falls 01/20/2020  . Alzheimer's dementia with behavioral disturbance (HCC) 01/20/2020  . Rhabdomyolysis 01/20/2020  . Fever 08/17/2017  . Generalized weakness 08/17/2017  . Late onset Alzheimer's disease without behavioral disturbance (HCC) 10/25/2014  . Coronary artery disease involving native coronary artery of native heart without angina pectoris 11/02/2013  . Pure hypercholesterolemia 11/02/2013  . Daytime somnolence 11/02/2013  . Essential hypertension, benign 11/02/2013    Orientation RESPIRATION BLADDER Height & Weight     Self  O2 (see d/c summary for requirements) Incontinent Weight: 87.3 kg Height:     BEHAVIORAL SYMPTOMS/MOOD NEUROLOGICAL BOWEL NUTRITION STATUS      Incontinent Diet (2gm Na with thin liquids)  AMBULATORY STATUS COMMUNICATION OF NEEDS Skin   Extensive Assist Verbally Normal                       Personal Care Assistance Level of Assistance  Bathing, Feeding, Dressing Bathing Assistance: Maximum assistance Feeding assistance: Maximum assistance Dressing Assistance: Maximum assistance     Functional Limitations Info  Sight, Hearing, Speech Sight Info: Impaired Hearing Info:  Impaired Speech Info: Adequate    SPECIAL CARE FACTORS FREQUENCY  PT (By licensed PT), OT (By licensed OT)     PT Frequency: 5x/wk OT Frequency: 5x/wk            Contractures Contractures Info: Not present    Additional Factors Info  Code Status, Allergies, Psychotropic Code Status Info: DNR Allergies Info: ambien/ lisinopril Psychotropic Info: Namenda 10 mg BID/ Seroquel 12.4 mg BID/ Exelon 3 mg BID         Current Medications (01/22/2020):  This is the current hospital active medication list Current Facility-Administered Medications  Medication Dose Route Frequency Provider Last Rate Last Admin  . 0.9 %  sodium chloride infusion  250 mL Intravenous PRN Agbata, Tochukwu, MD      . 0.9 %  sodium chloride infusion   Intravenous Continuous Dahal, Binaya, MD 75 mL/hr at 01/22/20 1222 New Bag at 01/22/20 1222  . acetaminophen (TYLENOL) tablet 650 mg  650 mg Oral Q6H PRN Agbata, Tochukwu, MD       Or  . acetaminophen (TYLENOL) suppository 650 mg  650 mg Rectal Q6H PRN Agbata, Tochukwu, MD      . aspirin EC tablet 81 mg  81 mg Oral Daily Agbata, Tochukwu, MD   81 mg at 01/22/20 0911  . enoxaparin (LOVENOX) injection 40 mg  40 mg Subcutaneous Q24H Agbata, Tochukwu, MD      . finasteride (PROSCAR) tablet 5 mg  5 mg Oral Daily Agbata, Tochukwu, MD   5 mg at 01/22/20 0911  . haloperidol lactate (HALDOL) injection 5 mg  5 mg Intravenous Q6H PRN Opyd, Lavone Neri, MD   5 mg at 01/20/20 2119  . hydrALAZINE (APRESOLINE) injection 10 mg  10 mg Intravenous Q6H PRN Dahal, Binaya, MD      . ipratropium-albuterol (DUONEB) 0.5-2.5 (3) MG/3ML nebulizer solution 3 mL  3 mL Nebulization Q6H PRN Dahal, Binaya, MD      . labetalol (NORMODYNE) injection 10 mg  10 mg Intravenous Q2H PRN Opyd, Lavone Neri, MD   10 mg at 01/21/20 1317  . levothyroxine (SYNTHROID) tablet 25 mcg  25 mcg Oral QAC breakfast Agbata, Tochukwu, MD      . memantine (NAMENDA) tablet 10 mg  10 mg Oral BID Agbata, Tochukwu, MD   10 mg  at 01/22/20 0911  . ondansetron (ZOFRAN) tablet 4 mg  4 mg Oral Q6H PRN Agbata, Tochukwu, MD       Or  . ondansetron (ZOFRAN) injection 4 mg  4 mg Intravenous Q6H PRN Agbata, Tochukwu, MD      . potassium chloride SA (KLOR-CON) CR tablet 40 mEq  40 mEq Oral Q2H Dahal, Melina Schools, MD   40 mEq at 01/22/20 1346  . QUEtiapine (SEROQUEL) tablet 12.5 mg  12.5 mg Oral BID Lorin Glass, MD   12.5 mg at 01/22/20 0911  . rivastigmine (EXELON) capsule 3 mg  3 mg Oral BID Agbata, Tochukwu, MD   3 mg at 01/22/20 0911  . simvastatin (ZOCOR) tablet 20 mg  20 mg Oral q1800 Agbata, Tochukwu, MD   20 mg at 01/21/20 1738  . sodium chloride flush (NS) 0.9 % injection 3 mL  3 mL Intravenous Q12H Agbata, Tochukwu, MD   3 mL at 01/21/20 2209  . sodium chloride flush (NS) 0.9 % injection 3 mL  3 mL Intravenous PRN Agbata, Tochukwu, MD      . tamsulosin (FLOMAX) capsule 0.4 mg  0.4 mg Oral Daily Agbata, Tochukwu, MD   0.4 mg at 01/22/20 0911     Discharge Medications: Please see discharge summary for a list of discharge medications.  Relevant Imaging Results:  Relevant Lab Results:   Additional Information SS#: [id number]      Palliative care to follow at the facility ([facility])  Kermit Balo, RN

## 2020-01-22 NOTE — TOC Initial Note (Signed)
Transition of Care [facility]) - Initial/Assessment Note    Patient Details  Name: Shaun Moore MRN: [id number] Date of Birth: Jul 11, 1925  [facility]) CM/SW Contact:    Kermit Balo, RN Phone Number: 01/22/2020, 2:15 PM  Clinical Narrative:                 Pt is confused with dementia. CM called and spoke to his son: [name]. [name] is stating the patient is not able to return home. His mother is no longer able to provide the care the patient needs.  MD inquired about residential hospice and son in agreement. CM spoke to [facility] (already active with pt). They don't feel the patient is a candidate for residential hospice at this time. CM spoke to son and he prefers SNF with palliative following. He is agreeable to having the patient faxed out in the [facility] area.  Will need PT/OT notes for insurance approval.  TOC following.  Expected Discharge Plan: Skilled Nursing Facility Barriers to Discharge: Continued Medical Work up   Patient Goals and CMS Choice   CMS Medicare.gov Compare Post Acute Care list provided to:: Patient Represenative (must comment) Choice offered to / list presented to : Adult Children (son)  Expected Discharge Plan and Services Expected Discharge Plan: Skilled Nursing Facility In-house Referral: Clinical Social Work Discharge Planning Services: CM Consult   Living arrangements for the past 2 months: Single Family Home                                      Prior Living Arrangements/Services Living arrangements for the past 2 months: Single Family Home Lives with:: Spouse Patient language and need for interpreter reviewed:: Yes        Need for Family Participation in Patient Care: Yes (Comment) Care giver support system in place?: No (comment)   Criminal Activity/Legal Involvement Pertinent to Current Situation/Hospitalization: No - Comment as needed  Activities of Daily Living   ADL Screening (condition at time of  admission) Patient's cognitive ability adequate to safely complete daily activities?: No Is the patient deaf or have difficulty hearing?: No Does the patient have difficulty seeing, even when wearing glasses/contacts?: No Does the patient have difficulty concentrating, remembering, or making decisions?: Yes Patient able to express need for assistance with ADLs?: No Does the patient have difficulty dressing or bathing?: Yes Independently performs ADLs?: No Communication: Needs assistance Is this a change from baseline?: Pre-admission baseline Dressing (OT): Needs assistance Is this a change from baseline?: Pre-admission baseline Grooming: Needs assistance Is this a change from baseline?: Pre-admission baseline Feeding:  (unsure) Bathing:  (unure) Toileting: Dependent Is this a change from baseline?: Pre-admission baseline In/Out Bed: Dependent Is this a change from baseline?: Pre-admission baseline Does the patient have difficulty walking or climbing stairs?: Yes Weakness of Legs: None Weakness of Arms/Hands: Both  Permission Sought/Granted                  Emotional Assessment Appearance:: Appears stated age     Orientation: : Oriented to Self   Psych Involvement: No (comment)  Admission diagnosis:  Gait instability [R26.81] Altered mental status, unspecified altered mental status type [R41.82] AMS (altered mental status) [R41.82] Patient Active Problem List   Diagnosis Date Noted  . AMS (altered mental status) 01/20/2020  . Frequent falls 01/20/2020  . Alzheimer's dementia with behavioral disturbance (HCC) 01/20/2020  . Rhabdomyolysis 01/20/2020  .  Fever 08/17/2017  . Generalized weakness 08/17/2017  . Late onset Alzheimer's disease without behavioral disturbance (HCC) 10/25/2014  . Coronary artery disease involving native coronary artery of native heart without angina pectoris 11/02/2013  . Pure hypercholesterolemia 11/02/2013  . Daytime somnolence 11/02/2013   . Essential hypertension, benign 11/02/2013   PCP:  Lupita Raider, MD Pharmacy:   [facility]- [place], [place] - [address] [place] [address] Phone: [phone] Fax: [phone]  [id number] [place], [place] - [address] AT [facility] & [address] [place] [address] Phone: [phone] Fax: [phone]     Social Determinants of Health (SDOH) Interventions    Readmission Risk Interventions No flowsheet data found.

## 2020-01-22 NOTE — Progress Notes (Signed)
Civil engineer, contracting [facility])  Received request from [facility] for residential hospice.  Chart and pt information reviewed by [facility] physician.  Pt is hospice appropriate but not deemed to be two weeks or less life expectancy.  Pt is not a candidate for residential hospice at this time.  Above information shared with Valerie Salts Manager. Appropriateness for home with hospice services discussed.  Per TOC family unable to care for pt at home and will need to seek SNF placement. [facility] liaisons will continue to follow pt as palliative care referral.  Should pt condition change pt can be reassessed at any time for residential hospice.    Please call with any questions or concerns. Thank you for the opportunity to participate in this pt's care.  Gillian Scarce, BSN, RN [employer] [phone] (24h on call)

## 2020-01-23 DIAGNOSIS — R4182 Altered mental status, unspecified: Secondary | ICD-10-CM | POA: Diagnosis not present

## 2020-01-23 LAB — BASIC METABOLIC PANEL
Anion gap: 10 (ref 5–15)
BUN: 27 mg/dL — ABNORMAL HIGH (ref 8–23)
CO2: 29 mmol/L (ref 22–32)
Calcium: 9 mg/dL (ref 8.9–10.3)
Chloride: 103 mmol/L (ref 98–111)
Creatinine, Ser: 1.25 mg/dL — ABNORMAL HIGH (ref 0.61–1.24)
GFR, Estimated: 54 mL/min — ABNORMAL LOW (ref >60)
Glucose, Bld: 139 mg/dL — ABNORMAL HIGH (ref 70–99)
Potassium: 3.4 mmol/L — ABNORMAL LOW (ref 3.5–5.1)
Sodium: 142 mmol/L (ref 135–145)

## 2020-01-23 LAB — CBC WITH DIFFERENTIAL/PLATELET
Abs Immature Granulocytes: 0.09 10*3/uL — ABNORMAL HIGH (ref 0.00–0.07)
Basophils Absolute: 0.1 10*3/uL (ref 0.0–0.1)
Basophils Relative: 0 %
Eosinophils Absolute: 0.5 10*3/uL (ref 0.0–0.5)
Eosinophils Relative: 2 %
HCT: 49 % (ref 39.0–52.0)
Hemoglobin: 15.3 g/dL (ref 13.0–17.0)
Immature Granulocytes: 1 %
Lymphocytes Relative: 8 %
Lymphs Abs: 1.6 10*3/uL (ref 0.7–4.0)
MCH: 30.7 pg (ref 26.0–34.0)
MCHC: 31.2 g/dL (ref 30.0–36.0)
MCV: 98.2 fL (ref 80.0–100.0)
Monocytes Absolute: 1.9 10*3/uL — ABNORMAL HIGH (ref 0.1–1.0)
Monocytes Relative: 10 %
Neutro Abs: 15.1 10*3/uL — ABNORMAL HIGH (ref 1.7–7.7)
Neutrophils Relative %: 79 %
Platelets: 312 10*3/uL (ref 150–400)
RBC: 4.99 MIL/uL (ref 4.22–5.81)
RDW: 13.1 % (ref 11.5–15.5)
WBC: 19.1 10*3/uL — ABNORMAL HIGH (ref 4.0–10.5)
nRBC: 0 % (ref 0.0–0.2)

## 2020-01-23 MED ORDER — ASPIRIN 81 MG PO CHEW
81.0000 mg | CHEWABLE_TABLET | Freq: Every day | ORAL | Status: DC
  Administered 2020-01-24 – 2020-02-05 (×12): 81 mg via ORAL
  Filled 2020-01-23 (×12): qty 1

## 2020-01-23 MED ORDER — CARVEDILOL 3.125 MG PO TABS
3.1250 mg | ORAL_TABLET | Freq: Two times a day (BID) | ORAL | Status: DC
  Administered 2020-01-23 – 2020-01-25 (×5): 3.125 mg via ORAL
  Filled 2020-01-23 (×5): qty 1

## 2020-01-23 MED ORDER — MELATONIN 3 MG PO TABS
3.0000 mg | ORAL_TABLET | Freq: Every evening | ORAL | Status: DC | PRN
  Administered 2020-01-23 – 2020-01-31 (×9): 3 mg via ORAL
  Filled 2020-01-23 (×9): qty 1

## 2020-01-23 MED ORDER — LOSARTAN POTASSIUM 50 MG PO TABS
50.0000 mg | ORAL_TABLET | Freq: Every day | ORAL | Status: DC
  Administered 2020-01-23 – 2020-02-03 (×11): 50 mg via ORAL
  Filled 2020-01-23 (×11): qty 1

## 2020-01-23 NOTE — Evaluation (Signed)
Occupational Therapy Evaluation Patient Details Name: Shaun Moore MRN: [id number] DOB: [birth year] Today's Date: 01/23/2020    History of Present Illness 86 y.o. male who was brought to the ED on 01/20/2020 for multiple falls, altered mental status.  PMH significant for Alzheimer's dementia, BPH, hypertension, GERD, back pain and OA. Patient fell about a week ago and struck his head on the ground but refused to be evaluated at the time.  Over the last 1 week, he has become increasingly agitated, more confused and difficult to reorient.  His gait is unsteady and he has had multiple falls despite the use of RW. Pt fell on 11/17 and was brought to the ED due to LBP.   Clinical Impression   Per chart, PTA, pt was living with his spouse and was ambulating at RW level with many falls over the past few weeks. Pt received in bed, incontinent of bowels. Pt required modA+2 for rolling R<>L and totalA for posterior care. Pt talking nonsensically and not following commands consistently. Pt not answering questions appropriately. Deferred further mobility secondary to cognition. Currently recommend SNF with followup OT services. Pt required modA for drinking and began coughing after taking a sip, communicated to RN, recommend SLP consult. Will continue to follow acutely and progress as tolerated.     Follow Up Recommendations  SNF    Equipment Recommendations  Other (comment) (defer to next venue)    Recommendations for Other Services  SLP consult     Precautions / Restrictions Precautions Precautions: Fall Restrictions Weight Bearing Restrictions: No      Mobility Bed Mobility Overal bed mobility: Needs Assistance Bed Mobility: Rolling Rolling: Mod assist;+2 for safety/equipment;+2 for physical assistance         General bed mobility comments: heavy modA+2 to roll R<>L for posterior care, pt with decreased ability to follow commands    Transfers                 General transfer  comment: not attempted for pt and therapist safety    Balance       Sitting balance - Comments: deferred                                   ADL either performed or assessed with clinical judgement   ADL Overall ADL's : Needs assistance/impaired Eating/Feeding: Moderate assistance Eating/Feeding Details (indicate cue type and reason): required assistance to bring cup to mouth to take drink of water, pt coughing following taking a sip of water, RN notified, recommend SLP consult Grooming: Moderate assistance   Upper Body Bathing: Maximal assistance   Lower Body Bathing: Total assistance;Bed level   Upper Body Dressing : Maximal assistance;Bed level   Lower Body Dressing: Total assistance   Toilet Transfer: Moderate assistance;+2 for safety/equipment;+2 for physical assistance Toilet Transfer Details (indicate cue type and reason): for rolling R<>L for posterior care Toileting- Clothing Manipulation and Hygiene: Total assistance Toileting - Clothing Manipulation Details (indicate cue type and reason): pt incontinent of bowels, unaware, required totalA for posterior care       General ADL Comments: pt limited to bed level secondary to decreased ability to follow instructions and need for +2 for rolling R<>L,      Vision   Additional Comments: difficult to assess secondary to cognition     Perception     Praxis      Pertinent Vitals/Pain Pain Assessment: Faces Faces Pain  Scale: No hurt Pain Intervention(s): Monitored during session     Hand Dominance Right   Extremity/Trunk Assessment Upper Extremity Assessment Upper Extremity Assessment: Generalized weakness   Lower Extremity Assessment Lower Extremity Assessment: Generalized weakness   Cervical / Trunk Assessment Cervical / Trunk Assessment: Kyphotic   Communication Communication Communication: HOH   Cognition Arousal/Alertness: Awake/alert Behavior During Therapy: Impulsive Overall  Cognitive Status: Impaired/Different from baseline Area of Impairment: Orientation;Attention;Memory;Following commands;Safety/judgement;Awareness;Problem solving                 Orientation Level: Disoriented to;Place;Time;Situation Current Attention Level: Sustained Memory: Decreased recall of precautions;Decreased short-term memory Following Commands: Follows one step commands consistently Safety/Judgement: Decreased awareness of safety;Decreased awareness of deficits Awareness: Intellectual Problem Solving: Slow processing General Comments: pt oriented to self only, pt talking non sensically, did not answer questions appropriately, following simple one step commands inconsistently with multimodal cues   General Comments  pt on 2l/min via nasal canula, VSS     Exercises     Shoulder Instructions      Home Living Family/patient expects to be discharged to:: Skilled nursing facility (family expects D/C to SNF) Living Arrangements: Spouse/significant other                               Additional Comments: pt is a poor historian, information per chart      Prior Functioning/Environment Level of Independence: Needs assistance  Gait / Transfers Assistance Needed: per chart, pt ambulating with a RW, many falls over the past few weeks              OT Problem List: Decreased strength;Decreased activity tolerance;Impaired balance (sitting and/or standing);Decreased coordination;Decreased cognition;Decreased safety awareness;Decreased knowledge of use of DME or AE;Cardiopulmonary status limiting activity      OT Treatment/Interventions: Self-care/ADL training;Therapeutic exercise;Energy conservation;DME and/or AE instruction;Therapeutic activities;Cognitive remediation/compensation;Patient/family education;Balance training    OT Goals(Current goals can be found in the care plan section) Acute Rehab OT Goals Patient Stated Goal: pt did not state OT Goal  Formulation: Patient unable to participate in goal setting Time For Goal Achievement: 02/06/20 Potential to Achieve Goals: Fair ADL Goals Pt Will Perform Eating: with set-up;sitting Pt Will Perform Grooming: with set-up;sitting Pt Will Transfer to Toilet: with min assist;stand pivot transfer Additional ADL Goal #1: Pt will complete bed mobility with minimal assistance in preparation for ADL/IADL.  OT Frequency: Min 2X/week   Barriers to D/C:            Co-evaluation              AM-PAC OT "6 Clicks" Daily Activity     Outcome Measure Help from another person eating meals?: A Lot Help from another person taking care of personal grooming?: A Lot Help from another person toileting, which includes using toliet, bedpan, or urinal?: Total Help from another person bathing (including washing, rinsing, drying)?: Total Help from another person to put on and taking off regular upper body clothing?: Total Help from another person to put on and taking off regular lower body clothing?: Total 6 Click Score: 8   End of Session Equipment Utilized During Treatment: Oxygen Nurse Communication: Mobility status  Activity Tolerance: Patient tolerated treatment well Patient left: in bed;with call bell/phone within reach;with bed alarm set (with bed in chair position)  OT Visit Diagnosis: Other abnormalities of gait and mobility (R26.89);Muscle weakness (generalized) (M62.81);Other symptoms and signs involving cognitive function  Time: 2426-8341 OT Time Calculation (min): 24 min Charges:  OT General Charges $OT Visit: 1 Visit OT Evaluation $OT Eval Moderate Complexity: 1 Mod OT Treatments $Self Care/Home Management : 8-22 mins  Rosey Bath OTR/L Acute Rehabilitation Services Office: [phone]   Rebeca Alert 01/23/2020, 2:59 PM

## 2020-01-23 NOTE — Progress Notes (Signed)
PROGRESS NOTE  Shaun Moore  DOB: [birth year]  PCP: Lupita Raider, MD JQB:[id number]  DOA: 01/20/2020  LOS: 3 days   Chief Complaint  Patient presents with  . Altered Mental Status  . Fall   Brief narrative: Shaun Moore is a 86 y.o. male who was brought to the ED on 01/20/2020 for multiple falls, altered mental status.  PMH significant for Alzheimer's dementia, BPH, hypertension, GERD, back pain and osteoarthritis  Patient lives at home with his wife, son and daughter-in-law.  Patient fell about a week ago and struck his head on the ground but refused to be evaluated at the time.  Over the last 1 week, he has become increasingly agitated, more confused and difficult to reorient.  His gait is unsteady and he has had multiple falls despite the use of rolling walker. After one of his falls on the day of admission, patient started complaining of lower back pain and so EMS was called and he was brought to the ED.  In the ED, patient was tachycardic up to 110, blood pressure elevated up to 240/181 Labs mostly unremarkable except for slight rising WBC 12.4 and potassium low at 3.4 CT had showed cerebral and cerebellar atrophy with mild chronic small vessel ischemic disease CT cervical spine did not show any evidence of acute fracture to the cervical spine.  Mild C3 -C4 grade 1 retrolisthesis. Cervical spondylosis. Chest x-ray did not show any active cardiopulmonary disease Lumbar spine x-ray shows no fracture or acute findings. Previous L3 vertebroplasty. Twelve-lead EKG shows sinus tachycardia with a right bundle branch block, QTC 468 ms.  While in the ED, patient was extremely agitated and kept attempting to get out of bed.  He was given Haldol 5 mg IV 1 time. Oxygen saturation dropped to 85% and he was started on 2 L by nasal cannula.   Admitted to hospital service for further evaluation management.    Subjective: Patient was seen and examined this morning. He is more alert.  Knows he is in  the hospital.  Disoriented otherwise. Wants to get out of bed. Diarrhea improving. Tachycardic this morning.  Assessment/Plan: Acute metabolic encephalopathy -In the setting of Alzheimer's dementia and multiple recent falls  -Presented with progressively worsening mental status for a week. -no evidence of UTI. -No clear cause of acute worsening mental status. -Seems to be gradually improving. -Currently on Seroquel 12.5 mg twice daily -Continue Exelon and Namenda for dementia. -Delirium precautions.  Acute respiratory failure with hypoxia -11/18, patient was wheezing which improved with IV Lasix and IV steroids and holding IV hydration -Chest x-ray on admission was clear.   -Respiratory status stable at this time on 2 L by nasal cannula.  Hypertension/tachycardia -This morning, blood pressure elevated 169/73, heart rate running 110-120. -I will start the patient on Coreg low-dose this morning. -Resume losartan as well.  CKD stage IIIb -Continue gentle hydration because of diarrhea. Recent Labs    01/20/20 1205 01/21/20 0311 01/22/20 0257 01/23/20 0757  BUN 14 16 23  27*  CREATININE 1.20 1.17 1.37* 1.25*   Hypokalemia -Potassium low at 3.4 today.  Oral replacement ordered. Recent Labs  Lab 01/20/20 1205 01/21/20 0311 01/22/20 0257 01/23/20 0757  K 3.4* 3.0* 3.0* 3.4*   Colonic ileus Diarrhea -Patient had distended but nontender abdomen. -11/18, CT scan of abdomen showed colonic ileus.  Discussed with general surgery.  Recommended enema but patient started having diarrhea without enema.  Diarrhea is now improving. -Continue normal saline 75 1/h.  Hypothyroidism -Continue Synthroid  History of BPH -Continue Flomax and Proscar  Mobility: PT eval ordered Code Status:   Code Status: DNR  Nutritional status: Body mass index is 27.62 kg/m.     Diet Order            Diet 2 gram sodium Room service appropriate? Yes; Fluid consistency: Thin  Diet effective  now                 DVT prophylaxis: enoxaparin (LOVENOX) injection 40 mg Start: 01/20/20 1545   Antimicrobials:  None Fluid: NS at 75 mill per hour. Consultants: None Family Communication:  None at bedside  Status is: Inpatient  Remains inpatient appropriate because:Altered mental status   Dispo: The patient is from: Home              Anticipated d/c is to: May be appropriate for residential hospice              Anticipated d/c date is: Whenever hospice bed is available              Patient currently is not medically stable to d/c.   Infusions:  . sodium chloride    . sodium chloride 75 mL/hr at 01/23/20 0500    Scheduled Meds: . aspirin EC  81 mg Oral Daily  . carvedilol  3.125 mg Oral BID WC  . enoxaparin (LOVENOX) injection  40 mg Subcutaneous Q24H  . finasteride  5 mg Oral Daily  . levothyroxine  25 mcg Oral QAC breakfast  . losartan  50 mg Oral Daily  . memantine  10 mg Oral BID  . QUEtiapine  12.5 mg Oral BID  . rivastigmine  3 mg Oral BID  . simvastatin  20 mg Oral q1800  . sodium chloride flush  3 mL Intravenous Q12H  . tamsulosin  0.4 mg Oral Daily    Antimicrobials: Anti-infectives (From admission, onward)   None      PRN meds: sodium chloride, acetaminophen **OR** acetaminophen, haloperidol lactate, hydrALAZINE, ipratropium-albuterol, labetalol, ondansetron **OR** ondansetron (ZOFRAN) IV, sodium chloride flush   Objective: Vitals:   01/23/20 0342 01/23/20 0733  BP: (!) 151/99 (!) 169/73  Pulse: (!) 105 (!) 117  Resp: 20 20  Temp: (!) 97.5 F (36.4 C) 97.6 F (36.4 C)  SpO2: 96% 97%    Intake/Output Summary (Last 24 hours) at 01/23/2020 1100 Last data filed at 01/23/2020 1024 Gross per 24 hour  Intake 1964.99 ml  Output 600 ml  Net 1364.99 ml   Filed Weights   01/20/20 2246  Weight: 87.3 kg   Weight change:  Body mass index is 27.62 kg/m.   Physical Exam: General exam: Not in physical discomfort Skin: No rashes, lesions or  ulcers. HEENT: Atraumatic, normocephalic, supple neck, no obvious bleeding Lungs: Clear to auscultation bilaterally  CVS: Regular rate and rhythm, no murmur GI/Abd -soft, nontender, bowel sound present CNS: Alert, awake, knows he is in the hospital Psychiatry: Depressed look Extremities: No pedal edema, no calf tenderness  Data Review: I have personally reviewed the laboratory data and studies available.  Recent Labs  Lab 01/20/20 1205 01/21/20 0311 01/22/20 0257 01/23/20 0757  WBC 12.4* 16.5* 17.3* 19.1*  NEUTROABS 9.6*  --  15.9* 15.1*  HGB 14.2 13.4 14.1 15.3  HCT 45.1 41.7 43.4 49.0  MCV 98.7 96.1 96.0 98.2  PLT 318 289 308 312   Recent Labs  Lab 01/20/20 1205 01/21/20 0311 01/22/20 0257 01/23/20 0757  NA 141 140 141  142  K 3.4* 3.0* 3.0* 3.4*  CL 104 102 102 103  CO2 26 25 28 29   GLUCOSE 122* 152* 198* 139*  BUN 14 16 23  27*  CREATININE 1.20 1.17 1.37* 1.25*  CALCIUM 9.2 8.7* 8.8* 9.0    F/u labs ordered  Signed, , MD [facility] 01/23/2020

## 2020-01-23 NOTE — Evaluation (Signed)
Clinical/Bedside Swallow Evaluation Patient Details  Name: Shaun Moore MRN: [id number] Date of Birth: [birth year]  Today's Date: 01/23/2020 Time: SLP Start Time (ACUTE ONLY): 1725 SLP Stop Time (ACUTE ONLY): 1750 SLP Time Calculation (min) (ACUTE ONLY): 25 min  Past Medical History:  Past Medical History:  Diagnosis Date  . Back pain   . BPH (benign prostatic hypertrophy)   . CAD (coronary artery disease)   . Cognitive impairment    dementia  . Esophageal reflux   . Essential hypertension, benign   . Headache    "pressure or heaviness in head"  . NSTEMI (non-ST elevated myocardial infarction) (HCC)   . OA (osteoarthritis)   . Pure hyperglyceridemia   . Rhinitis, allergic   . Vertigo    Past Surgical History:  Past Surgical History:  Procedure Laterality Date  . CARDIAC CATHETERIZATION     DE stent RCA and LAD   HPI:  86 y.o. male who was brought to the ED on 01/20/2020 for multiple falls, altered mental status.  PMH significant for Alzheimer's dementia, BPH, hypertension, GERD, back pain and OA. Patient fell about a week ago and struck his head on the ground but refused to be evaluated at the time.  Over the last 1 week, he has become increasingly agitated, more confused and difficult to reorient.  His gait is unsteady and he has had multiple falls despite the use of RW. Pt fell on 11/17 and was brought to the ED due to LBP.   Assessment / Plan / Recommendation Clinical Impression  Patient presents with a mild oropharyngeal dysphagia with likely impact from cognitive dysfunction (h/o Alzheimer's dementia). Patient declined solids except for medications crushed in puree, during which he exhibited some delay in oral transit and chewing of purees before swallow, but with full clearance of oral cavity. Patient consumed successive straw sips of water without coughing or throat clearing but with suspected delay of swallow initiation versus instance of discoordination of swallow. Per RN,  patient has had some coughing with liquids but has done fairly well with solids (but limited intake). Per son who arrived at end of session, patient has been declining with his dementia, eating and drink have decreased and family already all in agreement to seek hospice placement secondary to patient with agitation, multiple falls and family's inability to continue safetly caring for him at home. SLP Visit Diagnosis: Dysphagia, unspecified (R13.10)    Aspiration Risk  Mild aspiration risk    Diet Recommendation Dysphagia 3 (Mech soft);Thin liquid   Liquid Administration via: Cup;Straw Medication Administration: Crushed with puree Supervision: Patient able to self feed;Full supervision/cueing for compensatory strategies Compensations: Minimize environmental distractions;Slow rate;Small sips/bites Postural Changes: Seated upright at 90 degrees    Other  Recommendations Oral Care Recommendations: Oral care BID;Staff/trained caregiver to provide oral care   Follow up Recommendations Other (comment) (TBD)      Frequency and Duration min 2x/week  1 week       Prognosis Prognosis for Safe Diet Advancement: Fair Barriers to Reach Goals: Cognitive deficits      Swallow Study   General Date of Onset: 01/20/20 HPI: 86 y.o. male who was brought to the ED on 01/20/2020 for multiple falls, altered mental status.  PMH significant for Alzheimer's dementia, BPH, hypertension, GERD, back pain and OA. Patient fell about a week ago and struck his head on the ground but refused to be evaluated at the time.  Over the last 1 week, he has become increasingly agitated, more  confused and difficult to reorient.  His gait is unsteady and he has had multiple falls despite the use of RW. Pt fell on 11/17 and was brought to the ED due to LBP. Type of Study: Bedside Swallow Evaluation Previous Swallow Assessment: N/A Diet Prior to this Study: Regular;Thin liquids Temperature Spikes Noted: No Respiratory Status:  Room air History of Recent Intubation: No Behavior/Cognition: Alert;Confused;Agitated;Uncooperative;Requires cueing Oral Cavity Assessment: Within Functional Limits Oral Care Completed by SLP: No (did not participate/allow) Oral Cavity - Dentition: Adequate natural dentition Vision: Functional for self-feeding Self-Feeding Abilities: Able to feed self;Needs set up Patient Positioning: Upright in bed Baseline Vocal Quality: Other (comment) (mildly hoarse and decreased respiratory support) Volitional Cough: Cognitively unable to elicit Volitional Swallow: Unable to elicit    Oral/Motor/Sensory Function Overall Oral Motor/Sensory Function: Within functional limits   Ice Chips     Thin Liquid Thin Liquid: Impaired Presentation: Self Fed;Straw Pharyngeal  Phase Impairments: Suspected delayed Swallow    Nectar Thick     Honey Thick     Puree Puree: Impaired Presentation: Spoon Oral Phase Impairments: Impaired mastication Oral Phase Functional Implications: Prolonged oral transit   Solid     Solid: Not tested      Angela Nevin, MA, CCC-SLP Speech Therapy

## 2020-01-24 DIAGNOSIS — R4182 Altered mental status, unspecified: Secondary | ICD-10-CM | POA: Diagnosis not present

## 2020-01-24 NOTE — Progress Notes (Signed)
PROGRESS NOTE  Shaun Moore  DOB: Feb 01, 1926  PCP: Lupita Raider, MD QQP:[id number]  DOA: 01/20/2020  LOS: 4 days   Chief Complaint  Patient presents with  . Altered Mental Status  . Fall   Brief narrative: Shaun Moore is a 86 y.o. male who was brought to the ED on 01/20/2020 for multiple falls, altered mental status.  PMH significant for Alzheimer's dementia, BPH, hypertension, GERD, back pain and osteoarthritis  Patient lives at home with his wife, son and daughter-in-law.  Patient fell about a week ago and struck his head on the ground but refused to be evaluated at the time.  Over the last 1 week, he has become increasingly agitated, more confused and difficult to reorient.  His gait is unsteady and he has had multiple falls despite the use of rolling walker. After one of his falls on the day of admission, patient started complaining of lower back pain and so EMS was called and he was brought to the ED.  In the ED, patient was tachycardic up to 110, blood pressure elevated up to 240/181 Labs mostly unremarkable except for slight rising WBC 12.4 and potassium low at 3.4 CT had showed cerebral and cerebellar atrophy with mild chronic small vessel ischemic disease CT cervical spine did not show any evidence of acute fracture to the cervical spine.  Mild C3 -C4 grade 1 retrolisthesis. Cervical spondylosis. Chest x-ray did not show any active cardiopulmonary disease Lumbar spine x-ray shows no fracture or acute findings. Previous L3 vertebroplasty. Twelve-lead EKG shows sinus tachycardia with a right bundle branch block, QTC 468 ms.  While in the ED, patient was extremely agitated and kept attempting to get out of bed.  He was given Haldol 5 mg IV 1 time. Oxygen saturation dropped to 85% and he was started on 2 L by nasal cannula.   Admitted to hospital service for further evaluation management.    Subjective: Patient was seen and examined this morning. Alert, awake, slow to respond.   Knows he is in the hospital.  Wants to move. On low-flow oxygen by nasal cannula.  Assessment/Plan: Acute metabolic encephalopathy -In the setting of Alzheimer's dementia and multiple recent falls  -Presented with progressively worsening mental status for a week. -no evidence of UTI. -No clear cause of acute worsening mental status. -Seems to be gradually improving. -Currently on Seroquel 12.5 mg twice daily -Continue Exelon and Namenda for dementia. -Delirium precautions.  Acute respiratory failure with hypoxia -11/18, patient was wheezing which improved with IV Lasix and IV steroids and holding IV hydration -Chest x-ray on admission was clear.   -Respiratory status stable at this time on 2 L by nasal cannula.  Hypertension/tachycardia -Blood pressure heart rate are both improving now low-dose of Coreg and losartan.    CKD stage IIIb -Continue gentle hydration.  Repeat blood work Advertising account executive. Recent Labs    01/20/20 1205 01/21/20 0311 01/22/20 0257 01/23/20 0757  BUN 14 16 23  27*  CREATININE 1.20 1.17 1.37* 1.25*   Hypokalemia -Potassium was replaced yesterday.  Repeat tomorrow. Recent Labs  Lab 01/20/20 1205 01/21/20 0311 01/22/20 0257 01/23/20 0757  K 3.4* 3.0* 3.0* 3.4*   Colonic ileus Diarrhea -Patient had distended but nontender abdomen. -11/18, CT scan of abdomen showed colonic ileus.  Discussed with general surgery.  Recommended enema but patient started having diarrhea without enema.  Diarrhea is now improving. -Continue normal saline 75 mill per hour.05-26-1999  Hypothyroidism -Continue Synthroid  History of BPH -Continue Flomax and  Proscar  Mobility: PT eval obtained.  SNF recommended. Code Status:   Code Status: DNR  Nutritional status: Body mass index is 27.62 kg/m.     Diet Order            DIET DYS 3 Room service appropriate? Yes with Assist; Fluid consistency: Thin  Diet effective now                 DVT prophylaxis: enoxaparin (LOVENOX)  injection 40 mg Start: 01/20/20 1545   Antimicrobials:  None Fluid: NS at 75 mill per hour. Consultants: None Family Communication:  None at bedside  Status is: Inpatient  Remains inpatient appropriate because:Altered mental status gradual improving.  Pending SNF availability   Dispo: The patient is from: Home              Anticipated d/c is to: SNF              Anticipated d/c date is: 1 to 2 days              Patient currently is not medically stable to d/c.   Infusions:  . sodium chloride    . sodium chloride 75 mL/hr at 01/24/20 0542    Scheduled Meds: . aspirin  81 mg Oral Daily  . carvedilol  3.125 mg Oral BID WC  . enoxaparin (LOVENOX) injection  40 mg Subcutaneous Q24H  . finasteride  5 mg Oral Daily  . levothyroxine  25 mcg Oral QAC breakfast  . losartan  50 mg Oral Daily  . memantine  10 mg Oral BID  . QUEtiapine  12.5 mg Oral BID  . rivastigmine  3 mg Oral BID  . simvastatin  20 mg Oral q1800  . sodium chloride flush  3 mL Intravenous Q12H  . tamsulosin  0.4 mg Oral Daily    Antimicrobials: Anti-infectives (From admission, onward)   None      PRN meds: sodium chloride, acetaminophen **OR** acetaminophen, haloperidol lactate, hydrALAZINE, ipratropium-albuterol, labetalol, melatonin, ondansetron **OR** ondansetron (ZOFRAN) IV, sodium chloride flush   Objective: Vitals:   01/24/20 0400 01/24/20 0835  BP:  (!) 145/81  Pulse:  (!) 105  Resp: 18 20  Temp:  97.9 F (36.6 C)  SpO2:  98%    Intake/Output Summary (Last 24 hours) at 01/24/2020 1203 Last data filed at 01/24/2020 0542 Gross per 24 hour  Intake --  Output 900 ml  Net -900 ml   Filed Weights   01/20/20 2246  Weight: 87.3 kg   Weight change:  Body mass index is 27.62 kg/m.   Physical Exam: General exam: Not in physical discomfort.   Skin: No rashes, lesions or ulcers. HEENT: Atraumatic, normocephalic, supple neck, no obvious bleeding Lungs: Clear to auscultation bilaterally   CVS: Regular rate and rhythm, no murmur GI/Abd soft, nontender, nondistended, bowel sound present CNS: Alert, awake, knows he is in the hospital.  Not restless or agitated. Psychiatry: Depressed look Extremities: No pedal edema, no calf tenderness  Data Review: I have personally reviewed the laboratory data and studies available.  Recent Labs  Lab 01/20/20 1205 01/21/20 0311 01/22/20 0257 01/23/20 0757  WBC 12.4* 16.5* 17.3* 19.1*  NEUTROABS 9.6*  --  15.9* 15.1*  HGB 14.2 13.4 14.1 15.3  HCT 45.1 41.7 43.4 49.0  MCV 98.7 96.1 96.0 98.2  PLT 318 289 308 312   Recent Labs  Lab 01/20/20 1205 01/21/20 0311 01/22/20 0257 01/23/20 0757  NA 141 140 141 142  K 3.4* 3.0*  3.0* 3.4*  CL 104 102 102 103  CO2 26 25 28 29   GLUCOSE 122* 152* 198* 139*  BUN 14 16 23  27*  CREATININE 1.20 1.17 1.37* 1.25*  CALCIUM 9.2 8.7* 8.8* 9.0    F/u labs ordered  Signed, , MD [facility] 01/24/2020

## 2020-01-24 NOTE — Progress Notes (Signed)
  Speech Language Pathology Treatment: Dysphagia  Patient Details Name: Shaun Moore MRN: [id number] DOB: 10-09-1925 Today's Date: 01/24/2020 Time: 1287-8676 SLP Time Calculation (min) (ACUTE ONLY): 15 min  Assessment / Plan / Recommendation Clinical Impression  Patient seen to address dysphagia goals with current diet of Dys 3, thin liquids. Patient was calm but continues to be fixated on needing to move. He is very stiff and is not able to perform any part of bed mobility and SLP helped change head and neck position slightly in bed. Patient held cup and took several successive sips of thin liquids (water) via straw and did not exhibit any coughing or throat clearing. He continues with wheezing during inhalations. He refused any solid PO's at this time. Patient will benefit from likely just one more SLP treatment focusing on PO intake and safety but unfortunately, his advanced dementia is significantly impacting his ability to participate in oral diet intake.   HPI HPI: 86 y.o. male who was brought to the ED on 01/20/2020 for multiple falls, altered mental status.  PMH significant for Alzheimer's dementia, BPH, hypertension, GERD, back pain and OA. Patient fell about a week ago and struck his head on the ground but refused to be evaluated at the time.  Over the last 1 week, he has become increasingly agitated, more confused and difficult to reorient.  His gait is unsteady and he has had multiple falls despite the use of RW. Pt fell on 11/17 and was brought to the ED due to LBP.      SLP Plan  Continue with current plan of care       Recommendations  Diet recommendations: Dysphagia 3 (mechanical soft);Thin liquid Liquids provided via: Cup;Straw Medication Administration: Crushed with puree Supervision: Full supervision/cueing for compensatory strategies;Staff to assist with self feeding;Trained caregiver to feed patient Compensations: Minimize environmental distractions;Slow rate;Small  sips/bites Postural Changes and/or Swallow Maneuvers: Seated upright 90 degrees                Oral Care Recommendations: Oral care BID;Staff/trained caregiver to provide oral care Follow up Recommendations: 24 hour supervision/assistance;Skilled Nursing facility SLP Visit Diagnosis: Dysphagia, unspecified (R13.10) Plan: Continue with current plan of care       GO               Angela Nevin, MA, CCC-SLP Speech Therapy [facility]

## 2020-01-24 NOTE — TOC Progression Note (Signed)
Transition of Care [facility]) - Progression Note    Patient Details  Name: Shaun Moore MRN: [id number] Date of Birth: 1925/05/30  Transition of [facility] CM/SW Contact  Mearl Latin, LCSW Phone Number: 01/24/2020, 11:05 AM  Clinical Narrative:    No SNF bed offers available yet.    Expected Discharge Plan: Skilled Nursing Facility Barriers to Discharge: Continued Medical Work up  Expected Discharge Plan and Services Expected Discharge Plan: Skilled Nursing Facility In-house Referral: Clinical Social Work Discharge Planning Services: CM Consult   Living arrangements for the past 2 months: Single Family Home                                       Social Determinants of Health (SDOH) Interventions    Readmission Risk Interventions No flowsheet data found.

## 2020-01-24 NOTE — Progress Notes (Signed)
[facility] ([facility]) Community Based Palliative Care   °    °This patient has been referred to our palliative care services in the community.  [facility] will continue to follow for any discharge planning needs and to coordinate admission onto palliative care.   °If you have questions or need assistance, please call [phone] or contact the hospital Liaison listed on AMION.    ° °Thank you for the opportunity to participate in this patient’s care. °    °Chrislyn King, BSN, RN °[facility] Hospital Liaison   °[phone]  ° °

## 2020-01-25 DIAGNOSIS — R4182 Altered mental status, unspecified: Secondary | ICD-10-CM | POA: Diagnosis not present

## 2020-01-25 LAB — CBC WITH DIFFERENTIAL/PLATELET
Abs Immature Granulocytes: 0.07 10*3/uL (ref 0.00–0.07)
Basophils Absolute: 0.1 10*3/uL (ref 0.0–0.1)
Basophils Relative: 1 %
Eosinophils Absolute: 0.5 10*3/uL (ref 0.0–0.5)
Eosinophils Relative: 5 %
HCT: 41.9 % (ref 39.0–52.0)
Hemoglobin: 13.2 g/dL (ref 13.0–17.0)
Immature Granulocytes: 1 %
Lymphocytes Relative: 12 %
Lymphs Abs: 1.3 10*3/uL (ref 0.7–4.0)
MCH: 31.4 pg (ref 26.0–34.0)
MCHC: 31.5 g/dL (ref 30.0–36.0)
MCV: 99.5 fL (ref 80.0–100.0)
Monocytes Absolute: 1.3 10*3/uL — ABNORMAL HIGH (ref 0.1–1.0)
Monocytes Relative: 12 %
Neutro Abs: 7.5 10*3/uL (ref 1.7–7.7)
Neutrophils Relative %: 69 %
Platelets: 303 10*3/uL (ref 150–400)
RBC: 4.21 MIL/uL — ABNORMAL LOW (ref 4.22–5.81)
RDW: 13.2 % (ref 11.5–15.5)
WBC: 10.7 10*3/uL — ABNORMAL HIGH (ref 4.0–10.5)
nRBC: 0 % (ref 0.0–0.2)

## 2020-01-25 LAB — BASIC METABOLIC PANEL
Anion gap: 11 (ref 5–15)
BUN: 26 mg/dL — ABNORMAL HIGH (ref 8–23)
CO2: 27 mmol/L (ref 22–32)
Calcium: 8.5 mg/dL — ABNORMAL LOW (ref 8.9–10.3)
Chloride: 103 mmol/L (ref 98–111)
Creatinine, Ser: 1.25 mg/dL — ABNORMAL HIGH (ref 0.61–1.24)
GFR, Estimated: 54 mL/min — ABNORMAL LOW (ref >60)
Glucose, Bld: 122 mg/dL — ABNORMAL HIGH (ref 70–99)
Potassium: 3.5 mmol/L (ref 3.5–5.1)
Sodium: 141 mmol/L (ref 135–145)

## 2020-01-25 LAB — MAGNESIUM: Magnesium: 1.8 mg/dL (ref 1.7–2.4)

## 2020-01-25 LAB — PHOSPHORUS: Phosphorus: 3.2 mg/dL (ref 2.5–4.6)

## 2020-01-25 MED ORDER — CARVEDILOL 6.25 MG PO TABS
6.2500 mg | ORAL_TABLET | Freq: Two times a day (BID) | ORAL | Status: DC
  Administered 2020-01-25 – 2020-02-05 (×20): 6.25 mg via ORAL
  Filled 2020-01-25 (×20): qty 1

## 2020-01-25 NOTE — Progress Notes (Signed)
Physical Therapy Treatment Patient Details Name: Shaun Moore MRN: [id number] DOB: [birth year] Today's Date: 01/25/2020    History of Present Illness 86 y.o. male who was brought to the ED on 01/20/2020 for multiple falls, altered mental status.  PMH significant for Alzheimer's dementia, BPH, hypertension, GERD, back pain and OA. Patient fell about a week ago and struck his head on the ground but refused to be evaluated at the time.  Over the last 1 week, he has become increasingly agitated, more confused and difficult to reorient.  His gait is unsteady and he has had multiple falls despite the use of RW. Pt fell on 11/17 and was brought to the ED due to LBP.    PT Comments    Pt received in bed, calling out for help. He required Romelo assist supine to sit, mod assist sit to stand with RW, and mod assist SPT with RW. Heavy posterior lean in standing requiring mod assist to maintain balance. Pt fatigues quickly. Pt in recliner at end of session with feet elevated.    Follow Up Recommendations  SNF     Equipment Recommendations  Wheelchair (measurements PT);Hospital bed    Recommendations for Other Services       Precautions / Restrictions Precautions Precautions: Fall    Mobility  Bed Mobility Overal bed mobility: Needs Assistance Bed Mobility: Supine to Sit     Supine to sit: HOB elevated;[place] assist     General bed mobility comments: increased time  Transfers Overall transfer level: Needs assistance Equipment used: Rolling walker (2 wheeled)   Sit to Stand: Mod assist Stand pivot transfers: Mod assist       General transfer comment: Pt tends to pull up on RW. Pt able to take pivot steps bed to recliner.  Ambulation/Gait                 Stairs             Wheelchair Mobility    Modified Rankin (Stroke Patients Only)       Balance Overall balance assessment: Needs assistance Sitting-balance support: Feet supported;Single extremity  supported Sitting balance-[name] Scale: Fair     Standing balance support: Bilateral upper extremity supported;During functional activity Standing balance-[name] Scale: Poor Standing balance comment: modA with BUE support of RW, posterior lean                            Cognition Arousal/Alertness: Awake/alert Behavior During Therapy: Impulsive Overall Cognitive Status: Impaired/Different from baseline Area of Impairment: Orientation;Attention;Memory;Following commands;Safety/judgement;Awareness;Problem solving                 Orientation Level: Disoriented to;Place;Time;Situation Current Attention Level: Sustained Memory: Decreased recall of precautions;Decreased short-term memory Following Commands: Follows one step commands consistently Safety/Judgement: Decreased awareness of safety;Decreased awareness of deficits Awareness: Intellectual Problem Solving: Slow processing;Difficulty sequencing General Comments: Pt in room calling out for help. Upon entering room, pt in bed yelling "help me, I'm dying. Get my head up."      Exercises      General Comments General comments (skin integrity, edema, etc.): SpO2 92% at rest on 2L O2. O2 removed for transfer with desat to 85% on RA.      Pertinent Vitals/Pain Pain Assessment: Faces Faces Pain Scale: No hurt    Home Living                      Prior Function  PT Goals (current goals can now be found in the care plan section) Acute Rehab PT Goals Patient Stated Goal: not stated Progress towards PT goals: Progressing toward goals    Frequency    Min 2X/week      PT Plan Current plan remains appropriate    Co-evaluation              AM-PAC PT "6 Clicks" Mobility   Outcome Measure  Help needed turning from your back to your side while in a flat bed without using bedrails?: A Little Help needed moving from lying on your back to sitting on the side of a flat bed without using  bedrails?: A Lot Help needed moving to and from a bed to a chair (including a wheelchair)?: A Lot Help needed standing up from a chair using your arms (e.g., wheelchair or bedside chair)?: A Little Help needed to walk in hospital room?: A Lot Help needed climbing 3-5 steps with a railing? : Total 6 Click Score: 13    End of Session Equipment Utilized During Treatment: Gait belt;Oxygen Activity Tolerance: Patient tolerated treatment well Patient left: in chair;with call bell/phone within reach;with chair alarm set Nurse Communication: Mobility status PT Visit Diagnosis: Unsteadiness on feet (R26.81);Muscle weakness (generalized) (M62.81)     Time: 3810-1751 PT Time Calculation (min) (ACUTE ONLY): 24 min  Charges:  $Gait Training: 23-37 mins                     Aida Raider, [place]  Office # [phone] Pager [phone]    Ilda Foil 01/25/2020, 9:53 AM

## 2020-01-25 NOTE — Progress Notes (Signed)
PROGRESS NOTE  Brysen Velvet Bathe  DOB: 09-Aug-1925  PCP: Lupita Raider, MD POE:[id number]  DOA: 01/20/2020  LOS: 5 days   Chief Complaint  Patient presents with  . Altered Mental Status  . Fall   Brief narrative: Shaun Moore is a 86 y.o. male who was brought to the ED on 01/20/2020 for multiple falls, altered mental status.  PMH significant for Alzheimer's dementia, BPH, hypertension, GERD, back pain and osteoarthritis  Patient lives at home with his wife, son and daughter-in-law.  Patient fell about a week ago and struck his head on the ground but refused to be evaluated at the time.  Over the last 1 week, he has become increasingly agitated, more confused and difficult to reorient.  His gait is unsteady and he has had multiple falls despite the use of rolling walker. After one of his falls on the day of admission, patient started complaining of lower back pain and so EMS was called and he was brought to the ED.  In the ED, patient was tachycardic up to 110, blood pressure elevated up to 240/181 Labs mostly unremarkable except for slight rising WBC 12.4 and potassium low at 3.4 CT had showed cerebral and cerebellar atrophy with mild chronic small vessel ischemic disease CT cervical spine did not show any evidence of acute fracture to the cervical spine.  Mild C3 -C4 grade 1 retrolisthesis. Cervical spondylosis. Chest x-ray did not show any active cardiopulmonary disease Lumbar spine x-ray shows no fracture or acute findings. Previous L3 vertebroplasty. Twelve-lead EKG shows sinus tachycardia with a right bundle branch block, QTC 468 ms.  While in the ED, patient was extremely agitated and kept attempting to get out of bed.  He was given Haldol 5 mg IV 1 time. Oxygen saturation dropped to 85% and he was started on 2 L by nasal cannula.   Admitted to hospital service for further evaluation management.    Subjective: Patient was seen and examined this morning. Physical therapy was getting him  out of bed to chair.  Very weak, slow to maneuver. Denies any pain.  Feels better after getting out of bed. Not on supplemental oxygen this morning. Both heart rate and blood pressure remains elevated.  Assessment/Plan: Acute metabolic encephalopathy -In the setting of Alzheimer's dementia and multiple recent falls  -Presented with progressively worsening mental status for a week. -no evidence of UTI. -No clear cause of acute worsening mental status. -Mental status seems to be gradually improving. -Currently on Seroquel 12.5 mg twice daily.  Continue same. -Continue Exelon and Namenda for dementia. -Delirium precautions.  Acute respiratory failure with hypoxia -11/18, patient was wheezing which improved with IV Lasix and IV steroids and holding IV hydration -Chest x-ray on admission was clear.   -Respiratory status stable at this time on 2 L by nasal cannula.  Hypertension/tachycardia -Blood pressure and heart rate are both elevated in last 24 hours.   -Increase Coreg dose to 6.25 mg twice daily.  Continue losartan.  Continue to monitor   CKD stage IIIb -Poor oral hydration, intermittent liquid diarrhea. -Creatinine remains slightly elevated at his baseline.  I would continue IV fluid today. -Repeat labs tomorrow. Recent Labs    01/20/20 1205 01/21/20 0311 01/22/20 0257 01/23/20 0757 01/25/20 0233  BUN 14 16 23  27* 26*  CREATININE 1.20 1.17 1.37* 1.25* 1.25*   Hypokalemia -Potassium was replaced yesterday.  Repeat tomorrow. Recent Labs  Lab 01/20/20 1205 01/21/20 0311 01/22/20 0257 01/23/20 0757 01/25/20 0233  K 3.4*  3.0* 3.0* 3.4* 3.5  MG  --   --   --   --  1.8  PHOS  --   --   --   --  3.2   Colonic ileus Diarrhea -Patient had distended but nontender abdomen. -11/18, CT scan of abdomen showed colonic ileus.  Discussed with general surgery.  Recommended enema but patient started having diarrhea without enema.  Diarrhea is now improving. -Continue normal  saline 75 mill per hour.Marland Kitchen  Hypothyroidism -Continue Synthroid  History of BPH -Continue Flomax and Proscar  Mobility: PT eval obtained.  SNF recommended. Code Status:   Code Status: DNR  Nutritional status: Body mass index is 27.62 kg/m.     Diet Order            DIET DYS 3 Room service appropriate? Yes with Assist; Fluid consistency: Thin  Diet effective now                 DVT prophylaxis: enoxaparin (LOVENOX) injection 40 mg Start: 01/20/20 1545   Antimicrobials:  None Fluid: NS at 75 mill per hour. Consultants: None Family Communication:  None at bedside  Status is: Inpatient  Remains inpatient appropriate because:Altered mental status gradual improving.  Pending SNF availability  Dispo: The patient is from: Home              Anticipated d/c is to: SNF              Anticipated d/c date is: Whenever bed is available              Patient currently is medically stable for discharge.   Infusions:  . sodium chloride    . sodium chloride 75 mL/hr at 01/25/20 0842    Scheduled Meds: . aspirin  81 mg Oral Daily  . carvedilol  6.25 mg Oral BID WC  . enoxaparin (LOVENOX) injection  40 mg Subcutaneous Q24H  . finasteride  5 mg Oral Daily  . levothyroxine  25 mcg Oral QAC breakfast  . losartan  50 mg Oral Daily  . memantine  10 mg Oral BID  . QUEtiapine  12.5 mg Oral BID  . rivastigmine  3 mg Oral BID  . simvastatin  20 mg Oral q1800  . sodium chloride flush  3 mL Intravenous Q12H  . tamsulosin  0.4 mg Oral Daily    Antimicrobials: Anti-infectives (From admission, onward)   None      PRN meds: sodium chloride, acetaminophen **OR** acetaminophen, haloperidol lactate, hydrALAZINE, ipratropium-albuterol, labetalol, melatonin, ondansetron **OR** ondansetron (ZOFRAN) IV, sodium chloride flush   Objective: Vitals:   01/25/20 0319 01/25/20 0801  BP: (!) 149/62 (!) 178/86  Pulse: 79 (!) 107  Resp: 18   Temp: 97.7 F (36.5 C) 98.3 F (36.8 C)  SpO2: 98%  98%    Intake/Output Summary (Last 24 hours) at 01/25/2020 1106 Last data filed at 01/24/2020 2328 Gross per 24 hour  Intake --  Output 700 ml  Net -700 ml   Filed Weights   01/20/20 2246  Weight: 87.3 kg   Weight change:  Body mass index is 27.62 kg/m.   Physical Exam: General exam: Not in physical discomfort.  Feels better getting out of bed to chair. Skin: No rashes, lesions or ulcers. HEENT: Atraumatic, normocephalic, supple neck, no obvious bleeding Lungs: Clear to auscultation bilaterally  CVS: Regular rate and rhythm, no murmur GI/Abd soft, nontender, nondistended, bowel sound present CNS: Alert, awake, knows he is in the hospital.  Not restless  or agitated. Psychiatry: Depressed look Extremities: No pedal edema, no calf tenderness  Data Review: I have personally reviewed the laboratory data and studies available.  Recent Labs  Lab 01/20/20 1205 01/21/20 0311 01/22/20 0257 01/23/20 0757 01/25/20 0233  WBC 12.4* 16.5* 17.3* 19.1* 10.7*  NEUTROABS 9.6*  --  15.9* 15.1* 7.5  HGB 14.2 13.4 14.1 15.3 13.2  HCT 45.1 41.7 43.4 49.0 41.9  MCV 98.7 96.1 96.0 98.2 99.5  PLT 318 289 308 312 303   Recent Labs  Lab 01/20/20 1205 01/21/20 0311 01/22/20 0257 01/23/20 0757 01/25/20 0233  NA 141 140 141 142 141  K 3.4* 3.0* 3.0* 3.4* 3.5  CL 104 102 102 103 103  CO2 26 25 28 29 27   GLUCOSE 122* 152* 198* 139* 122*  BUN 14 16 23  27* 26*  CREATININE 1.20 1.17 1.37* 1.25* 1.25*  CALCIUM 9.2 8.7* 8.8* 9.0 8.5*  MG  --   --   --   --  1.8  PHOS  --   --   --   --  3.2    F/u labs ordered  Signed, , MD [facility] 01/25/2020

## 2020-01-25 NOTE — Progress Notes (Signed)
  Speech Language Pathology Treatment: Dysphagia  Patient Details Name: Shaun Moore MRN: [id number] DOB: 22-Nov-1925 Today's Date: 01/25/2020 Time: 1638-4536 SLP Time Calculation (min) (ACUTE ONLY): 19 min  Assessment / Plan / Recommendation Clinical Impression  Pt perseverated on head placement while SLP in room; nursing assisted with positioning prior to PO intake to improve comfort for pt.  Noted min wheezing prior to intake with exhalation (this did not increase after POs); pt agitated and required mod verbal/visual cues during tx session to decrease overall anxiety/increase participation with PO intake.  He consumed thin via straw, puree and soft solids with decreased mastication noted with solids, but he was able to clear from oral cavity as nursing stated he "spit out breakfast" earlier this date.  He swallowed thin via straw and consumed multiple bites of puree/soft solids without overt s/s of aspiration noted throughout consumption.  Pt should continue Dysphagia 3/thin liquids with mod verbal/visual cues and pt feeding self with A from caregiver to promote Kekai intake during meals with guided bites/sips for volume control.  ST will s/o at this time as pt tolerating current diet with swallowing precautions in place.  May consider downgrading diet if intake continues to decrease and pt is not maintaining nutrition/hydration adequately.     HPI HPI: 86 y.o. male who was brought to the ED on 01/20/2020 for multiple falls, altered mental status.  PMH significant for Alzheimer's dementia, BPH, hypertension, GERD, back pain and OA. Patient fell about a week ago and struck his head on the ground but refused to be evaluated at the time.  Over the last 1 week, he has become increasingly agitated, more confused and difficult to reorient.  His gait is unsteady and he has had multiple falls despite the use of RW. Pt fell on 11/17 and was brought to the ED due to LBP.      SLP Plan  Discharge SLP treatment  due to (comment)       Recommendations  Diet recommendations: Dysphagia 3 (mechanical soft);Thin liquid Liquids provided via: Cup;Straw Medication Administration: Crushed with puree Supervision: Staff to assist with self feeding;Full supervision/cueing for compensatory strategies Compensations: Minimize environmental distractions;Slow rate;Small sips/bites Postural Changes and/or Swallow Maneuvers: Seated upright 90 degrees                Oral Care Recommendations: Oral care BID Follow up Recommendations: 24 hour supervision/assistance SLP Visit Diagnosis: Dysphagia, unspecified (R13.10) Plan: Discharge SLP treatment due to (comment)                       Tressie Stalker, M.S., CCC-SLP 01/25/2020, 3:47 PM

## 2020-01-26 ENCOUNTER — Ambulatory Visit: Payer: Self-pay | Admitting: Podiatry

## 2020-01-26 DIAGNOSIS — R4182 Altered mental status, unspecified: Secondary | ICD-10-CM | POA: Diagnosis not present

## 2020-01-26 LAB — BASIC METABOLIC PANEL
Anion gap: 13 (ref 5–15)
BUN: 25 mg/dL — ABNORMAL HIGH (ref 8–23)
CO2: 23 mmol/L (ref 22–32)
Calcium: 8.4 mg/dL — ABNORMAL LOW (ref 8.9–10.3)
Chloride: 105 mmol/L (ref 98–111)
Creatinine, Ser: 1.28 mg/dL — ABNORMAL HIGH (ref 0.61–1.24)
GFR, Estimated: 53 mL/min — ABNORMAL LOW (ref >60)
Glucose, Bld: 102 mg/dL — ABNORMAL HIGH (ref 70–99)
Potassium: 3.9 mmol/L (ref 3.5–5.1)
Sodium: 141 mmol/L (ref 135–145)

## 2020-01-26 LAB — CBC WITH DIFFERENTIAL/PLATELET
Abs Immature Granulocytes: 0.11 10*3/uL — ABNORMAL HIGH (ref 0.00–0.07)
Basophils Absolute: 0.1 10*3/uL (ref 0.0–0.1)
Basophils Relative: 1 %
Eosinophils Absolute: 0.3 10*3/uL (ref 0.0–0.5)
Eosinophils Relative: 3 %
HCT: 43.1 % (ref 39.0–52.0)
Hemoglobin: 13.4 g/dL (ref 13.0–17.0)
Immature Granulocytes: 1 %
Lymphocytes Relative: 11 %
Lymphs Abs: 1.3 10*3/uL (ref 0.7–4.0)
MCH: 30.9 pg (ref 26.0–34.0)
MCHC: 31.1 g/dL (ref 30.0–36.0)
MCV: 99.3 fL (ref 80.0–100.0)
Monocytes Absolute: 1.4 10*3/uL — ABNORMAL HIGH (ref 0.1–1.0)
Monocytes Relative: 12 %
Neutro Abs: 8.5 10*3/uL — ABNORMAL HIGH (ref 1.7–7.7)
Neutrophils Relative %: 72 %
Platelets: 300 10*3/uL (ref 150–400)
RBC: 4.34 MIL/uL (ref 4.22–5.81)
RDW: 13 % (ref 11.5–15.5)
WBC: 11.6 10*3/uL — ABNORMAL HIGH (ref 4.0–10.5)
nRBC: 0 % (ref 0.0–0.2)

## 2020-01-26 NOTE — TOC Progression Note (Addendum)
Transition of Care [facility]) - Progression Note    Patient Details  Name: Shaun Moore MRN: [id number] Date of Birth: [birth year]  Transition of Care [facility]) CM/SW Contact  Kermit Balo, RN Phone Number: 01/26/2020, 12:42 PM  Clinical Narrative:    Currently no bed offers for the patient. CM has sent out messages to the pending facilities to see if they can offer pt a bed. CM following.  1330: [facility]/ [facility]/ [facility] have all declined to offer a bed.    Expected Discharge Plan: Skilled Nursing Facility Barriers to Discharge: Continued Medical Work up  Expected Discharge Plan and Services Expected Discharge Plan: Skilled Nursing Facility In-house Referral: Clinical Social Work Discharge Planning Services: CM Consult   Living arrangements for the past 2 months: Single Family Home                                       Social Determinants of Health (SDOH) Interventions    Readmission Risk Interventions No flowsheet data found.

## 2020-01-26 NOTE — Progress Notes (Addendum)
Occupational Therapy Treatment Patient Details Name: Shaun Moore MRN: [id number] DOB: 1925-06-22 Today's Date: 01/26/2020    History of present illness 86 y.o. male who was brought to the ED on 01/20/2020 for multiple falls, altered mental status.  PMH significant for Alzheimer's dementia, BPH, hypertension, GERD, back pain and OA. Patient fell about a week ago and struck his head on the ground but refused to be evaluated at the time.  Over the last 1 week, he has become increasingly agitated, more confused and difficult to reorient.  His gait is unsteady and he has had multiple falls despite the use of RW. Pt fell on 11/17 and was brought to the ED due to LBP.   OT comments  Pt founded slumped down in bed with O2 doffed, pts face red and breathing labored. Pt 86% on RA; donned O2 at 2L with O2 increasing to 90%. Assisted pt to EOB with MOD A +2. Pt completed stand pivot transfer to recliner with MOD A +2 and RW. Pt with heavy posterior lean needing assist to shift weight anteriorly and manage RW during transfer. Pt nonverbal during transfer but nodding appropriately throughout session. Assisted pt with self feeding with pt needing total A for hand to mouth pattern, pt also noted to spit out d3 diet items. Left pt on  3L to maintain SpO2 sats >90%. Left pt up in recliner with all needs within reach and chair alarm set. Agree with DC plan below, will follow acutely per POC.   Follow Up Recommendations  SNF    Equipment Recommendations  Other (comment) (defer to next venue of care)    Recommendations for Other Services      Precautions / Restrictions Precautions Precautions: Fall Precaution Comments: watch O2 Restrictions Weight Bearing Restrictions: No       Mobility Bed Mobility Overal bed mobility: Needs Assistance Bed Mobility: Supine to Sit     Supine to sit: Mod assist;+2 for physical assistance     General bed mobility comments: MOD A +2 to sit EOB needing assist to elevate  trunk and scoot hips to EOB  Transfers Overall transfer level: Needs assistance Equipment used: Rolling walker (2 wheeled) Transfers: Sit to/from [employer] Sit to Stand: Mod assist;From elevated surface Stand pivot transfers: Mod assist;+2 safety/equipment       General transfer comment: pt with heavy posterior lean needing assist to shift weight anteriorly, pt taking short guarded steps needing cues to widen BOS and manage RW    Balance Overall balance assessment: Needs assistance Sitting-balance support: Feet supported;Single extremity supported Sitting balance-[name] Scale: Fair     Standing balance support: Bilateral upper extremity supported;During functional activity Standing balance-[name] Scale: Poor Standing balance comment: modA with BUE support of RW, posterior lean                           ADL either performed or assessed with clinical judgement   ADL Overall ADL's : Needs assistance/impaired     Grooming: Total assistance;Sitting Grooming Details (indicate cue type and reason): total A to wash face this session despite tactile and initial hand over hand assist         Upper Body Dressing : Maximal assistance;Bed level Upper Body Dressing Details (indicate cue type and reason): to change gown     Toilet Transfer: Moderate assistance;+2 for safety/equipment;+2 for physical assistance;Stand-pivot;RW Toilet Transfer Details (indicate cue type and reason): simulated via stand pivot transfe from EOB>recliner with  MOD A +2; heavy posterior lean with short gaurded steps         Functional mobility during ADLs: Moderate assistance;+2 for physical assistance;+2 for safety/equipment;Rolling walker General ADL Comments: pt present with impaired balance, decreased activity tolerance and basline cognitive impairments impacting pts ability to complete BADLS     Vision   Additional Comments: noted increased watering from eyes however vision  difficult to assess secondary to cognition   Perception     Praxis      Cognition Arousal/Alertness: Awake/alert;Lethargic (more lethargic towards end of session) Behavior During Therapy: Flat affect Overall Cognitive Status: Impaired/Different from baseline Area of Impairment: Orientation;Attention;Following commands;Safety/judgement;Awareness;Problem solving                 Orientation Level: Disoriented to;Place;Situation (unable to verbalize location) Current Attention Level: Focused   Following Commands: Follows one step commands consistently Safety/Judgement: Decreased awareness of safety;Decreased awareness of deficits Awareness: Intellectual Problem Solving: Slow processing;Difficulty sequencing General Comments: pt nonverbal during session but does follow commands and nod appropriately,        Exercises     Shoulder Instructions       General Comments pt with O2 doffed upon arrival, call bell out of reach and slumped down in bed appearing to be uncomfortable with pt reaching out to therapists for help. O2 86 % on RA needing increase to 3L to maintan >90%    Pertinent Vitals/ Pain       Pain Assessment: Faces Faces Pain Scale: Hurts even more Pain Location: general discomfort from position in bed Pain Descriptors / Indicators: Grimacing Pain Intervention(s): Monitored during session;Repositioned  Home Living                                          Prior Functioning/Environment              Frequency  Min 2X/week        Progress Toward Goals  OT Goals(current goals can now be found in the care plan section)  Progress towards OT goals: Progressing toward goals  Acute Rehab OT Goals Patient Stated Goal: not stated OT Goal Formulation: Patient unable to participate in goal setting Time For Goal Achievement: 02/06/20 Potential to Achieve Goals: Fair  Plan Discharge plan remains appropriate;Frequency remains appropriate     Co-evaluation                 AM-PAC OT "6 Clicks" Daily Activity     Outcome Measure   Help from another person eating meals?: A Lot Help from another person taking care of personal grooming?: A Lot Help from another person toileting, which includes using toliet, bedpan, or urinal?: Total Help from another person bathing (including washing, rinsing, drying)?: Total Help from another person to put on and taking off regular upper body clothing?: Total Help from another person to put on and taking off regular lower body clothing?: Total 6 Click Score: 8    End of Session Equipment Utilized During Treatment: Gait belt;Rolling walker;Oxygen;Other (comment) (2-3L)  OT Visit Diagnosis: Other abnormalities of gait and mobility (R26.89);Muscle weakness (generalized) (M62.81);Other symptoms and signs involving cognitive function   Activity Tolerance Patient tolerated treatment well   Patient Left in chair;with call bell/phone within reach;with chair alarm set   Nurse Communication Mobility status;Other (comment) (+2 stedy back to bed)        Time: 9381-0175 OT  Time Calculation (min): 28 min  Charges: OT General Charges $OT Visit: 1 Visit OT Treatments $Self Care/Home Management : 8-22 mins $Therapeutic Activity: 8-22 mins  Audery Amel., COTA/L Acute Rehabilitation Services [phone]    Angelina Pih 01/26/2020, 12:15 PM

## 2020-01-26 NOTE — Progress Notes (Signed)
PROGRESS NOTE  Shaun Moore  DOB: 1925-07-16  PCP: Lupita Raider, MD ZOX:[id number]  DOA: 01/20/2020  LOS: 6 days   Chief Complaint  Patient presents with  . Altered Mental Status  . Fall   Brief narrative: Shaun Moore is a 86 y.o. male who was brought to the ED on 01/20/2020 for multiple falls, altered mental status.  PMH significant for Alzheimer's dementia, BPH, hypertension, GERD, back pain and osteoarthritis  Patient lives at home with his wife, son and daughter-in-law.  Patient fell about a week ago and struck his head on the ground but refused to be evaluated at the time.  Over the last 1 week, he has become increasingly agitated, more confused and difficult to reorient.  His gait is unsteady and he has had multiple falls despite the use of rolling walker. After one of his falls on the day of admission, patient started complaining of lower back pain and so EMS was called and he was brought to the ED.  In the ED, patient was tachycardic up to 110, blood pressure elevated up to 240/181 Labs mostly unremarkable except for slight rising WBC 12.4 and potassium low at 3.4 CT had showed cerebral and cerebellar atrophy with mild chronic small vessel ischemic disease CT cervical spine did not show any evidence of acute fracture to the cervical spine.  Mild C3 -C4 grade 1 retrolisthesis. Cervical spondylosis. Chest x-ray did not show any active cardiopulmonary disease Lumbar spine x-ray shows no fracture or acute findings. Previous L3 vertebroplasty. Twelve-lead EKG shows sinus tachycardia with a right bundle branch block, QTC 468 ms.  While in the ED, patient was extremely agitated and kept attempting to get out of bed.  He was given Haldol 5 mg IV 1 time. Oxygen saturation dropped to 85% and he was started on 2 L by nasal cannula.   Admitted to hospital service for further evaluation management.    Subjective: Patient was seen and examined this morning. Restless, delirious in bed.  Trying to get up. Awake, alert, Knows he is in the hospital.  Assessment/Plan: Acute metabolic encephalopathy -In the setting of Alzheimer's dementia and multiple recent falls  -Presented with progressively worsening mental status for a week. -no evidence of UTI. -No clear cause of acute worsening mental status. -Mental status seems to be gradually improving. But he has episodes of restlessness, agitation. -Currently on Seroquel 12.5 mg twice daily.  Continue the same.. -Continue Exelon and Namenda for dementia. -Delirium precautions.  Acute respiratory failure with hypoxia -11/18, patient was wheezing which improved with IV Lasix and IV steroids and holding IV hydration -Chest x-ray on admission was clear.   -Respiratory status stable at this time on 2 L by nasal cannula.  Hypertension/tachycardia -Improving on increased dose of Coreg 6.25 mg twice daily and losartan. Continue to monitor.  CKD stage IIIb -Poor oral hydration, intermittent liquid diarrhea. -Creatinine remains slightly elevated at his baseline.  I would continue IV fluid. -Repeat labs tomorrow. Recent Labs    01/20/20 1205 01/21/20 0311 01/22/20 0257 01/23/20 0757 01/25/20 0233 01/26/20 0328  BUN 14 16 23  27* 26* 25*  CREATININE 1.20 1.17 1.37* 1.25* 1.25* 1.28*   Hypokalemia/hypomagnesemia -Improving with correction. Repeat tomorrow.  Recent Labs  Lab 01/21/20 0311 01/22/20 0257 01/23/20 0757 01/25/20 0233 01/26/20 0328  K 3.0* 3.0* 3.4* 3.5 3.9  MG  --   --   --  1.8  --   PHOS  --   --   --  3.2  --    Colonic ileus Diarrhea -Patient had distended but nontender abdomen. -11/18, CT scan of abdomen showed colonic ileus.  Discussed with general surgery.  Recommended enema but patient started having diarrhea without enema.  Diarrhea is now improving. -Continue normal saline 75 mill per hour.Marland Kitchen  Hypothyroidism -Continue Synthroid  History of BPH -Continue Flomax and Proscar  Mobility: PT  eval obtained.  SNF recommended. Code Status:   Code Status: DNR  Nutritional status: Body mass index is 27.62 kg/m.     Diet Order            DIET DYS 3 Room service appropriate? Yes with Assist; Fluid consistency: Thin  Diet effective now                 DVT prophylaxis: enoxaparin (LOVENOX) injection 40 mg Start: 01/20/20 1545   Antimicrobials:  None Fluid: NS at 75 mill per hour. Consultants: None Family Communication:  None at bedside  Status is: Inpatient  Remains inpatient appropriate because:Altered mental status gradual improving.  Pending SNF availability  Dispo: The patient is from: Home              Anticipated d/c is to: SNF              Anticipated d/c date is: Whenever bed is available              Patient currently is medically stable for discharge.   Infusions:  . sodium chloride    . sodium chloride Stopped (01/26/20 1106)    Scheduled Meds: . aspirin  81 mg Oral Daily  . carvedilol  6.25 mg Oral BID WC  . enoxaparin (LOVENOX) injection  40 mg Subcutaneous Q24H  . finasteride  5 mg Oral Daily  . levothyroxine  25 mcg Oral QAC breakfast  . losartan  50 mg Oral Daily  . memantine  10 mg Oral BID  . QUEtiapine  12.5 mg Oral BID  . rivastigmine  3 mg Oral BID  . simvastatin  20 mg Oral q1800  . sodium chloride flush  3 mL Intravenous Q12H  . tamsulosin  0.4 mg Oral Daily    Antimicrobials: Anti-infectives (From admission, onward)   None      PRN meds: sodium chloride, acetaminophen **OR** acetaminophen, haloperidol lactate, hydrALAZINE, ipratropium-albuterol, labetalol, melatonin, ondansetron **OR** ondansetron (ZOFRAN) IV, sodium chloride flush   Objective: Vitals:   01/26/20 0822 01/26/20 1212  BP: (!) 189/87 (!) 142/70  Pulse: 88 89  Resp: 18 17  Temp: (!) 97.4 F (36.3 C) (!) 97.3 F (36.3 C)  SpO2: 100% 99%    Intake/Output Summary (Last 24 hours) at 01/26/2020 1330 Last data filed at 01/26/2020 0353 Gross per 24 hour    Intake 60 ml  Output 1500 ml  Net -1440 ml   Filed Weights   01/20/20 2246  Weight: 87.3 kg   Weight change:  Body mass index is 27.62 kg/m.   Physical Exam: General exam: Not in physical pain but restless and wants to get out of bed Skin: No rashes, lesions or ulcers. HEENT: Atraumatic, normocephalic, supple neck, no obvious bleeding Lungs: Clear to auscultation bilaterally CVS: Regular rate and rhythm, no murmur GI/Abd soft, nontender, nondistended, bowel sound present CNS: Alert, awake, knows he is in the hospital.  Not restless or agitated. Psychiatry: Depressed look Extremities: No pedal edema, no calf tenderness  Data Review: I have personally reviewed the laboratory data and studies available.  Recent Labs  Lab  01/20/20 1205 01/20/20 1205 01/21/20 0311 01/22/20 0257 01/23/20 0757 01/25/20 0233 01/26/20 0328  WBC 12.4*   < > 16.5* 17.3* 19.1* 10.7* 11.6*  NEUTROABS 9.6*  --   --  15.9* 15.1* 7.5 8.5*  HGB 14.2   < > 13.4 14.1 15.3 13.2 13.4  HCT 45.1   < > 41.7 43.4 49.0 41.9 43.1  MCV 98.7   < > 96.1 96.0 98.2 99.5 99.3  PLT 318   < > 289 308 312 303 300   < > = values in this interval not displayed.   Recent Labs  Lab 01/21/20 0311 01/22/20 0257 01/23/20 0757 01/25/20 0233 01/26/20 0328  NA 140 141 142 141 141  K 3.0* 3.0* 3.4* 3.5 3.9  CL 102 102 103 103 105  CO2 25 28 29 27 23   GLUCOSE 152* 198* 139* 122* 102*  BUN 16 23 27* 26* 25*  CREATININE 1.17 1.37* 1.25* 1.25* 1.28*  CALCIUM 8.7* 8.8* 9.0 8.5* 8.4*  MG  --   --   --  1.8  --   PHOS  --   --   --  3.2  --     F/u labs ordered  Signed, , MD [facility] 01/26/2020

## 2020-01-26 NOTE — Progress Notes (Signed)
Occupational Therapy Treatment Patient Details Name: Shaun Moore MRN: [id number] DOB: 01/15/1926 Today's Date: 01/26/2020    History of present illness 86 y.o. male who was brought to the ED on 01/20/2020 for multiple falls, altered mental status.  PMH significant for Alzheimer's dementia, BPH, hypertension, GERD, back pain and OA. Patient fell about a week ago and struck his head on the ground but refused to be evaluated at the time.  Over the last 1 week, he has become increasingly agitated, more confused and difficult to reorient.  His gait is unsteady and he has had multiple falls despite the use of RW. Pt fell on 11/17 and was brought to the ED due to LBP.   OT comments  Pt seen for second session as RN requesting assist returning pt back to bed. Pt much more alert and conversant this session. Pt continues to present with baseline cognitive deficits ( daughter in law endorses that pt is at his baseline level of cognition), impaired balance, decreased activity tolerance and decreased ability to care for self. Pt needing less assist this afternoon to stand using stedy to transfer pt back to bed. Pt stood from recliner with MOD A +2 and required MIN A +2 to stand from seat of stedy. Daughter in law reports agreement on DC plan as family is ready to transition pt to long term SNF placement. Will continue to follow acutely per POC.   Follow Up Recommendations  SNF    Equipment Recommendations  Other (comment) (defer to next venue of care)    Recommendations for Other Services      Precautions / Restrictions Precautions Precautions: Fall Precaution Comments: watch O2 Restrictions Weight Bearing Restrictions: No       Mobility Bed Mobility Overal bed mobility: Needs Assistance Bed Mobility: Sit to Supine     Supine to sit: Mod assist;+2 for physical assistance Sit to supine: Mod assist   General bed mobility comments: MOD A to elevate BLEs back to bed with pt able to lower trunk to  flat HOB with minguard for safety and line mgmt  Transfers Overall transfer level: Needs assistance Equipment used: Ambulation equipment used Transfers: Sit to/from [employer] Sit to Stand: Min assist;Mod assist;+2 physical assistance;+2 safety/equipment Stand pivot transfers: Total assist (stedy)       General transfer comment: pt required MOD A +2 to power up from recliner however pt able to stand x2 from seat of stedy with MIN A +2 mostly for safety    Balance Overall balance assessment: Needs assistance Sitting-balance support: Feet supported;Single extremity supported Sitting balance-[name] Scale: Fair     Standing balance support: Bilateral upper extremity supported;During functional activity Standing balance-[name] Scale: Poor Standing balance comment: reliant on BUE support and at least min guard for safety                           ADL either performed or assessed with clinical judgement   ADL Overall ADL's : Needs assistance/impaired                         Toilet Transfer: Minimal assistance;+2 for safety/equipment;Moderate assistance;+2 for physical assistance Toilet Transfer Details (indicate cue type and reason): simulated via functionalmobility to stand to stedy from recliner; MOD A +2 from recliner, MIN A +2 for safety from seat of stedy         Functional mobility during ADLs: Minimal assistance;Moderate assistance;+2 for  physical assistance;+2 for safety/equipment General ADL Comments: returned for second session as RN requested assist getting pt back to bed. pt much more conversant this session and requried less assist to sit<>stand, utilized stedy for transfer back bed     Vision   Additional Comments: noted increased watering from eyes however vision difficult to assess secondary to cognition   Perception     Praxis      Cognition Arousal/Alertness: Awake/alert Behavior During Therapy: Flat affect;Impulsive  (continues to be flat but does talk more this session) Overall Cognitive Status: Within Functional Limits for tasks assessed Area of Impairment: Orientation;Attention;Following commands;Safety/judgement;Awareness;Problem solving                 Orientation Level: Disoriented to;Place;Situation (unable to verbalize location) Current Attention Level: Focused Memory: Decreased short-term memory (does not remember this OTA from AM session) Following Commands: Follows one step commands consistently;Follows multi-step commands with increased time Safety/Judgement: Decreased awareness of safety;Decreased awareness of deficits Awareness: Intellectual Problem Solving: Slow processing;Difficulty sequencing General Comments: daughter in law present this session stating he is at  baseline level of cognition. pt following commands, more impulsive this session as pt wanting to get out of chair.        Exercises     Shoulder Instructions       General Comments pts dtr in law present during session stating families plan is to transition pt to long term SNF care    Pertinent Vitals/ Pain       Pain Assessment: Faces Faces Pain Scale: No hurt Pain Location: general discomfort from position in bed Pain Descriptors / Indicators: Grimacing Pain Intervention(s): Monitored during session;Repositioned  Home Living                                          Prior Functioning/Environment              Frequency  Min 2X/week        Progress Toward Goals  OT Goals(current goals can now be found in the care plan section)  Progress towards OT goals: Progressing toward goals  Acute Rehab OT Goals Patient Stated Goal: not stated OT Goal Formulation: Patient unable to participate in goal setting Time For Goal Achievement: 02/06/20 Potential to Achieve Goals: Fair  Plan Discharge plan remains appropriate;Frequency remains appropriate    Co-evaluation                  AM-PAC OT "6 Clicks" Daily Activity     Outcome Measure   Help from another person eating meals?: A Lot Help from another person taking care of personal grooming?: A Lot Help from another person toileting, which includes using toliet, bedpan, or urinal?: A Lot Help from another person bathing (including washing, rinsing, drying)?: A Lot Help from another person to put on and taking off regular upper body clothing?: A Lot Help from another person to put on and taking off regular lower body clothing?: Total 6 Click Score: 11    End of Session Equipment Utilized During Treatment: Gait belt;Other (comment);Oxygen (stedy)  OT Visit Diagnosis: Other abnormalities of gait and mobility (R26.89);Muscle weakness (generalized) (M62.81);Other symptoms and signs involving cognitive function   Activity Tolerance Patient tolerated treatment well   Patient Left in bed;with call bell/phone within reach;with family/visitor present;with nursing/sitter in room   Nurse Communication Mobility status  Time: 5638-9373 OT Time Calculation (min): 11 min  Charges: OT General Charges $OT Visit: 1 Visit OT Treatments  $Therapeutic Activity: 8-22 mins  Audery Amel., COTA/L Acute Rehabilitation Services [phone]    Angelina Pih 01/26/2020, 3:43 PM

## 2020-01-27 ENCOUNTER — Inpatient Hospital Stay (HOSPITAL_COMMUNITY): Payer: Medicare Other

## 2020-01-27 DIAGNOSIS — I2510 Atherosclerotic heart disease of native coronary artery without angina pectoris: Secondary | ICD-10-CM

## 2020-01-27 DIAGNOSIS — G309 Alzheimer's disease, unspecified: Secondary | ICD-10-CM | POA: Diagnosis not present

## 2020-01-27 DIAGNOSIS — I10 Essential (primary) hypertension: Secondary | ICD-10-CM

## 2020-01-27 DIAGNOSIS — T796XXD Traumatic ischemia of muscle, subsequent encounter: Secondary | ICD-10-CM

## 2020-01-27 DIAGNOSIS — R4182 Altered mental status, unspecified: Secondary | ICD-10-CM | POA: Diagnosis not present

## 2020-01-27 LAB — RESPIRATORY PANEL BY RT PCR (FLU A&B, COVID)
Influenza A by PCR: NEGATIVE
Influenza B by PCR: NEGATIVE
SARS Coronavirus 2 by RT PCR: NEGATIVE

## 2020-01-27 LAB — BASIC METABOLIC PANEL
Anion gap: 11 (ref 5–15)
BUN: 26 mg/dL — ABNORMAL HIGH (ref 8–23)
CO2: 24 mmol/L (ref 22–32)
Calcium: 8.4 mg/dL — ABNORMAL LOW (ref 8.9–10.3)
Chloride: 107 mmol/L (ref 98–111)
Creatinine, Ser: 1.28 mg/dL — ABNORMAL HIGH (ref 0.61–1.24)
GFR, Estimated: 53 mL/min — ABNORMAL LOW (ref >60)
Glucose, Bld: 100 mg/dL — ABNORMAL HIGH (ref 70–99)
Potassium: 4.1 mmol/L (ref 3.5–5.1)
Sodium: 142 mmol/L (ref 135–145)

## 2020-01-27 LAB — CBC WITH DIFFERENTIAL/PLATELET
Abs Immature Granulocytes: 0.14 10*3/uL — ABNORMAL HIGH (ref 0.00–0.07)
Basophils Absolute: 0.1 10*3/uL (ref 0.0–0.1)
Basophils Relative: 1 %
Eosinophils Absolute: 0.3 10*3/uL (ref 0.0–0.5)
Eosinophils Relative: 2 %
HCT: 42.6 % (ref 39.0–52.0)
Hemoglobin: 13 g/dL (ref 13.0–17.0)
Immature Granulocytes: 1 %
Lymphocytes Relative: 11 %
Lymphs Abs: 1.5 10*3/uL (ref 0.7–4.0)
MCH: 31.4 pg (ref 26.0–34.0)
MCHC: 30.5 g/dL (ref 30.0–36.0)
MCV: 102.9 fL — ABNORMAL HIGH (ref 80.0–100.0)
Monocytes Absolute: 2.1 10*3/uL — ABNORMAL HIGH (ref 0.1–1.0)
Monocytes Relative: 15 %
Neutro Abs: 9.7 10*3/uL — ABNORMAL HIGH (ref 1.7–7.7)
Neutrophils Relative %: 70 %
Platelets: 260 10*3/uL (ref 150–400)
RBC: 4.14 MIL/uL — ABNORMAL LOW (ref 4.22–5.81)
RDW: 13.1 % (ref 11.5–15.5)
WBC: 13.8 10*3/uL — ABNORMAL HIGH (ref 4.0–10.5)
nRBC: 0 % (ref 0.0–0.2)

## 2020-01-27 LAB — PHOSPHORUS: Phosphorus: 3.4 mg/dL (ref 2.5–4.6)

## 2020-01-27 LAB — MAGNESIUM: Magnesium: 2 mg/dL (ref 1.7–2.4)

## 2020-01-27 LAB — TSH: TSH: 2.078 u[IU]/mL (ref 0.350–4.500)

## 2020-01-27 MED ORDER — CARVEDILOL 6.25 MG PO TABS: 6.2500 mg | ORAL_TABLET | Freq: Two times a day (BID) | ORAL | Status: AC

## 2020-01-27 MED ORDER — QUETIAPINE FUMARATE 25 MG PO TABS
12.5000 mg | ORAL_TABLET | Freq: Two times a day (BID) | ORAL | Status: DC
Start: 2020-01-27 — End: 2020-01-31

## 2020-01-27 MED ORDER — SENNOSIDES-DOCUSATE SODIUM 8.6-50 MG PO TABS: 1.0000 | ORAL_TABLET | Freq: Every evening | ORAL | Status: DC | PRN

## 2020-01-27 MED ORDER — POLYETHYLENE GLYCOL 3350 17 G PO PACK
17.0000 g | PACK | Freq: Every day | ORAL | Status: DC
  Administered 2020-01-27 – 2020-02-04 (×6): 17 g via ORAL
  Filled 2020-01-27 (×8): qty 1

## 2020-01-27 MED ORDER — ACETAMINOPHEN 325 MG PO TABS
650.0000 mg | ORAL_TABLET | Freq: Four times a day (QID) | ORAL | Status: DC | PRN
  Administered 2020-01-31 – 2020-02-01 (×2): 650 mg via ORAL
  Filled 2020-01-27 (×2): qty 2

## 2020-01-27 NOTE — Progress Notes (Signed)
PROGRESS NOTE    Shaun Moore  KWI:[id number] DOB: [birth year] DOA: 01/20/2020 PCP: Lupita Raider, MD   Brief Narrative:  86 year old with history of Alzheimer, BPH, HTN, GERD, back pain, osteoarthritis who lives with wife son and daughter-in-law has been progressively getting weaker over the course the past week, dehydrated and agitated.  Upon admission he was also delirious in the hospital.  His trauma work-up was negative, no evidence of infection was noted.  PT recommended SNF.  His encephalopathy slowly improved with IV fluid and some rest.  Ongoing arrangements for SNF placement   Assessment & Plan:   Principal Problem:   AMS (altered mental status) Active Problems:   Coronary artery disease involving native coronary artery of native heart without angina pectoris   Essential hypertension, benign   Frequent falls   Alzheimer's dementia with behavioral disturbance (HCC)   Rhabdomyolysis   Acute metabolic encephalopathy -Does have underlying Alzheimer's but his encephalopathy is slowly improving with IV fluids and supportive care.  PT is recommending SNF therefore arrangements being made -Continue his home regimen of Seroquel, Exelon and Namenda -He needs good night rest so he can participate in therapy during the day.  Acute respiratory failure with hypoxia -Likely from some atelectasis.  Needs to be out of bed to chair as much as possible and ambulate.  Incentive spirometer and bronchodilators if needed  Hypertension/tachycardia Continue increased dose of Coreg 6.25 mg twice daily and losartan. Continue to monitor.  CKD stage IIIb -IV fluids as needed.  Monitor urine output.  Colonic ileus Diarrhea -This is improved.  Abdominal x-ray this morning is also negative.  Continue bowel regimen as needed  Hypothyroidism -Continue Synthroid  History of BPH -Continue Flomax and Proscar  DVT prophylaxis: Lovenox Code Status: DNR Family Communication: Called his wife but  no answer  Status is: Inpatient  Remains inpatient appropriate because:Unsafe d/c plan   Dispo: The patient is from: Home              Anticipated d/c is to: SNF              Anticipated d/c date is: 1 day              Patient currently is medically stable to d/c.  Currently awaiting SNF placement   Body mass index is 27.62 kg/m.      Subjective: Patient is slightly confused this morning but overall follows basic commands and answers basic questions.  No focal neuro deficits.  Review of Systems Otherwise negative except as per HPI, including: General: Denies fever, chills, night sweats or unintended weight loss. Resp: Denies cough, wheezing, shortness of breath. Cardiac: Denies chest pain, palpitations, orthopnea, paroxysmal nocturnal dyspnea. GI: Denies abdominal pain, nausea, vomiting, diarrhea or constipation GU: Denies dysuria, frequency, hesitancy or incontinence MS: Denies muscle aches, joint pain or swelling Neuro: Denies headache, neurologic deficits (focal weakness, numbness, tingling), abnormal gait Psych: Denies anxiety, depression, SI/HI/AVH Skin: Denies new rashes or lesions ID: Denies sick contacts, exotic exposures, travel  Examination:  General exam: Appears calm and comfortable  Respiratory system: Clear to auscultation. Respiratory effort normal. Cardiovascular system: S1 & S2 heard, RRR. No JVD, murmurs, rubs, gallops or clicks. No pedal edema. Gastrointestinal system: Abdomen is nondistended, soft and nontender. No organomegaly or masses felt. Normal bowel sounds heard. Central nervous system: Alert to name only, grossly moving all the extremities Extremities: Symmetric 5 x 5 power. Skin: No rashes, lesions or ulcers Psychiatry: Poor judgment and insight  Objective: Vitals:   01/26/20 2330 01/27/20 0311 01/27/20 0825 01/27/20 1144  BP: 123/60 (!) 156/80 (!) 160/72 136/67  Pulse: 76 82 76 85  Resp: 18 19 16 18   Temp: 97.7 F (36.5 C) 98.1 F  (36.7 C) 97.9 F (36.6 C) 98.1 F (36.7 C)  TempSrc:  Oral Oral Oral  SpO2: 99% 100% 100% 97%  Weight:        Intake/Output Summary (Last 24 hours) at 01/27/2020 1148 Last data filed at 01/27/2020 0900 Gross per 24 hour  Intake 240 ml  Output 900 ml  Net -660 ml   Filed Weights   01/20/20 2246  Weight: 87.3 kg     Data Reviewed:   CBC: Recent Labs  Lab 01/22/20 0257 01/23/20 0757 01/25/20 0233 01/26/20 0328 01/27/20 0438  WBC 17.3* 19.1* 10.7* 11.6* 13.8*  NEUTROABS 15.9* 15.1* 7.5 8.5* 9.7*  HGB 14.1 15.3 13.2 13.4 13.0  HCT 43.4 49.0 41.9 43.1 42.6  MCV 96.0 98.2 99.5 99.3 102.9*  PLT 308 312 303 300 260   Basic Metabolic Panel: Recent Labs  Lab 01/22/20 0257 01/23/20 0757 01/25/20 0233 01/26/20 0328 01/27/20 0438  NA 141 142 141 141 142  K 3.0* 3.4* 3.5 3.9 4.1  CL 102 103 103 105 107  CO2 28 29 27 23 24   GLUCOSE 198* 139* 122* 102* 100*  BUN 23 27* 26* 25* 26*  CREATININE 1.37* 1.25* 1.25* 1.28* 1.28*  CALCIUM 8.8* 9.0 8.5* 8.4* 8.4*  MG  --   --  1.8  --  2.0  PHOS  --   --  3.2  --  3.4   GFR: Estimated Creatinine Clearance: 38 mL/min (A) (by C-G formula based on SCr of 1.28 mg/dL (H)). Liver Function Tests: Recent Labs  Lab 01/20/20 1205  AST 23  ALT 19  ALKPHOS 143*  BILITOT 1.3*  PROT 6.8  ALBUMIN 3.4*   No results for input(s): LIPASE, AMYLASE in the last 168 hours. No results for input(s): AMMONIA in the last 168 hours. Coagulation Profile: No results for input(s): INR, PROTIME in the last 168 hours. Cardiac Enzymes: Recent Labs  Lab 01/20/20 2317  CKTOTAL 358  CKMB 6.3*   BNP (last 3 results) No results for input(s): PROBNP in the last 8760 hours. HbA1C: No results for input(s): HGBA1C in the last 72 hours. CBG: Recent Labs  Lab 01/20/20 1203  GLUCAP 120*   Lipid Profile: No results for input(s): CHOL, HDL, LDLCALC, TRIG, CHOLHDL, LDLDIRECT in the last 72 hours. Thyroid Function Tests: Recent Labs     01/27/20 0910  TSH 2.078   Anemia Panel: No results for input(s): VITAMINB12, FOLATE, FERRITIN, TIBC, IRON, RETICCTPCT in the last 72 hours. Sepsis Labs: No results for input(s): PROCALCITON, LATICACIDVEN in the last 168 hours.  Recent Results (from the past 240 hour(s))  Respiratory Panel by RT PCR (Flu A&B, Covid) - Nasopharyngeal Swab     Status: None   Collection Time: 01/20/20  7:12 PM   Specimen: Nasopharyngeal Swab  Result Value Ref Range Status   SARS Coronavirus 2 by RT PCR NEGATIVE NEGATIVE Final    Comment: (NOTE) SARS-CoV-2 target nucleic acids are NOT DETECTED.  The SARS-CoV-2 RNA is generally detectable in upper respiratoy specimens during the acute phase of infection. The lowest concentration of SARS-CoV-2 viral copies this assay can detect is 131 copies/mL. A negative result does not preclude SARS-Cov-2 infection and should not be used as the sole basis for treatment or other patient management  decisions. A negative result may occur with  improper specimen collection/handling, submission of specimen other than nasopharyngeal swab, presence of viral mutation(s) within the areas targeted by this assay, and inadequate number of viral copies (<131 copies/mL). A negative result must be combined with clinical observations, patient history, and epidemiological information. The expected result is Negative.  Fact Sheet for Patients:  https://www.moore.com/  Fact Sheet for Healthcare Providers:  https://www.young.biz/  This test is no t yet approved or cleared by the [place] FDA and  has been authorized for detection and/or diagnosis of SARS-CoV-2 by FDA under an Emergency Use Authorization (EUA). This EUA will remain  in effect (meaning this test can be used) for the duration of the COVID-19 declaration under Section 564(b)(1) of the Act, 21 U.S.C. section 360bbb-3(b)(1), unless the authorization is terminated or revoked  sooner.     Influenza A by PCR NEGATIVE NEGATIVE Final   Influenza B by PCR NEGATIVE NEGATIVE Final    Comment: (NOTE) The Xpert Xpress SARS-CoV-2/FLU/RSV assay is intended as an aid in  the diagnosis of influenza from Nasopharyngeal swab specimens and  should not be used as a sole basis for treatment. Nasal washings and  aspirates are unacceptable for Xpert Xpress SARS-CoV-2/FLU/RSV  testing.  Fact Sheet for Patients: https://www.moore.com/  Fact Sheet for Healthcare Providers: https://www.young.biz/  This test is not yet approved or cleared by the [place] FDA and  has been authorized for detection and/or diagnosis of SARS-CoV-2 by  FDA under an Emergency Use Authorization (EUA). This EUA will remain  in effect (meaning this test can be used) for the duration of the  Covid-19 declaration under Section 564(b)(1) of the Act, 21  U.S.C. section 360bbb-3(b)(1), unless the authorization is  terminated or revoked. Performed at [facility], [address], [place], [place] [address]          Radiology Studies: DG Abd 1 View  Result Date: 01/27/2020 CLINICAL DATA:  Abdominal distension EXAM: ABDOMEN - 1 VIEW COMPARISON:  January 21, 2020 abdominal radiograph and CT abdomen and pelvis FINDINGS: Moderate stool in colon. No appreciable bowel dilatation or air-fluid level to suggest bowel obstruction. No free air. Probable phleboliths in the pelvis. Apparent prior vertebroplasty at L3. IMPRESSION: No bowel obstruction or free air evident on supine examination. Electronically Signed   By: Bretta Bang III M.D.   On: 01/27/2020 10:40        Scheduled Meds: . aspirin  81 mg Oral Daily  . carvedilol  6.25 mg Oral BID WC  . enoxaparin (LOVENOX) injection  40 mg Subcutaneous Q24H  . finasteride  5 mg Oral Daily  . levothyroxine  25 mcg Oral QAC breakfast  . losartan  50 mg Oral Daily  . memantine  10 mg Oral BID  .  polyethylene glycol  17 g Oral Daily  . QUEtiapine  12.5 mg Oral BID  . rivastigmine  3 mg Oral BID  . simvastatin  20 mg Oral q1800  . sodium chloride flush  3 mL Intravenous Q12H  . tamsulosin  0.4 mg Oral Daily   Continuous Infusions: . sodium chloride    . sodium chloride 75 mL/hr at 01/27/20 0922     LOS: 7 days   Time spent= 15 mins    Etsuko Dierolf Joline Maxcy, MD [facility]  If 7PM-7AM, please contact night-coverage  01/27/2020, 11:48 AM

## 2020-01-27 NOTE — TOC Progression Note (Addendum)
Transition of Care [facility]) - Progression Note    Patient Details  Name: Shaun Moore MRN: [id number] Date of Birth: 01-May-1925  Transition of Care [facility]) CM/SW Contact  Terrial Rhodes, LCSWA Phone Number: 01/27/2020, 10:33 AM  Clinical Narrative:     Update 11/24 10:52am-CSW started insurance authorization. Reference number is #[id number]. CSW faxed over clinicals to patients insurance for review.  Pending SNF choice. Pending insurance authorization.  CSW spoke with patients son [name] and provided SNF bed offers. Patients son is going to give CSW SNF choice by noon today. CSW will start insurance authorization.  Pending SNF choice.   CSW will continue to follow.   Expected Discharge Plan: Skilled Nursing Facility Barriers to Discharge: Continued Medical Work up  Expected Discharge Plan and Services Expected Discharge Plan: Skilled Nursing Facility In-house Referral: Clinical Social Work Discharge Planning Services: CM Consult   Living arrangements for the past 2 months: Single Family Home                                       Social Determinants of Health (SDOH) Interventions    Readmission Risk Interventions No flowsheet data found.

## 2020-01-27 NOTE — TOC Progression Note (Signed)
Transition of Care [facility]) - Progression Note    Patient Details  Name: Shaun Moore MRN: [id number] Date of Birth: 1925-05-13  Transition of Care [facility]) CM/SW Contact  Terrial Rhodes, LCSWA Phone Number: 01/27/2020, 5:20 PM  Clinical Narrative:     CSW spoke with patients son [name] who chose [facility] for SNF placement CSW called Marylene Land and Lorrie from [employer] and left voicemail with them to try and confirm SNF placement for patient. CSW awaiting callback.Insurance authorization pending.  CSW will continue to follow.    Expected Discharge Plan: Skilled Nursing Facility Barriers to Discharge: Continued Medical Work up  Expected Discharge Plan and Services Expected Discharge Plan: Skilled Nursing Facility In-house Referral: Clinical Social Work Discharge Planning Services: CM Consult   Living arrangements for the past 2 months: Single Family Home Expected Discharge Date: 01/27/20                                     Social Determinants of Health (SDOH) Interventions    Readmission Risk Interventions No flowsheet data found.

## 2020-01-28 DIAGNOSIS — I2510 Atherosclerotic heart disease of native coronary artery without angina pectoris: Secondary | ICD-10-CM | POA: Diagnosis not present

## 2020-01-28 DIAGNOSIS — G309 Alzheimer's disease, unspecified: Secondary | ICD-10-CM | POA: Diagnosis not present

## 2020-01-28 DIAGNOSIS — I10 Essential (primary) hypertension: Secondary | ICD-10-CM | POA: Diagnosis not present

## 2020-01-28 DIAGNOSIS — R4182 Altered mental status, unspecified: Secondary | ICD-10-CM | POA: Diagnosis not present

## 2020-01-28 LAB — BASIC METABOLIC PANEL
Anion gap: 8 (ref 5–15)
BUN: 21 mg/dL (ref 8–23)
CO2: 31 mmol/L (ref 22–32)
Calcium: 8.5 mg/dL — ABNORMAL LOW (ref 8.9–10.3)
Chloride: 105 mmol/L (ref 98–111)
Creatinine, Ser: 1.15 mg/dL (ref 0.61–1.24)
GFR, Estimated: 60 mL/min — ABNORMAL LOW (ref >60)
Glucose, Bld: 103 mg/dL — ABNORMAL HIGH (ref 70–99)
Potassium: 4 mmol/L (ref 3.5–5.1)
Sodium: 144 mmol/L (ref 135–145)

## 2020-01-28 LAB — CBC
HCT: 40.4 % (ref 39.0–52.0)
Hemoglobin: 12.3 g/dL — ABNORMAL LOW (ref 13.0–17.0)
MCH: 31.1 pg (ref 26.0–34.0)
MCHC: 30.4 g/dL (ref 30.0–36.0)
MCV: 102.3 fL — ABNORMAL HIGH (ref 80.0–100.0)
Platelets: 267 10*3/uL (ref 150–400)
RBC: 3.95 MIL/uL — ABNORMAL LOW (ref 4.22–5.81)
RDW: 12.8 % (ref 11.5–15.5)
WBC: 10.2 10*3/uL (ref 4.0–10.5)
nRBC: 0 % (ref 0.0–0.2)

## 2020-01-28 LAB — MAGNESIUM: Magnesium: 2.1 mg/dL (ref 1.7–2.4)

## 2020-01-28 MED ORDER — IPRATROPIUM-ALBUTEROL 0.5-2.5 (3) MG/3ML IN SOLN
3.0000 mL | Freq: Three times a day (TID) | RESPIRATORY_TRACT | Status: DC
  Administered 2020-01-28 – 2020-02-03 (×17): 3 mL via RESPIRATORY_TRACT
  Filled 2020-01-28 (×16): qty 3

## 2020-01-28 MED ORDER — KETOTIFEN FUMARATE 0.025 % OP SOLN
1.0000 [drp] | Freq: Two times a day (BID) | OPHTHALMIC | Status: DC
  Administered 2020-01-28 – 2020-02-05 (×17): 1 [drp] via OPHTHALMIC
  Filled 2020-01-28: qty 5

## 2020-01-28 MED ORDER — IPRATROPIUM-ALBUTEROL 0.5-2.5 (3) MG/3ML IN SOLN: 3.0000 mL | Freq: Three times a day (TID) | RESPIRATORY_TRACT | Status: DC

## 2020-01-28 NOTE — Progress Notes (Signed)
Patient is overall doing okay still slightly confused this morning trying to get out of bed but after readjusting he appears more comfortable. His vital signs are overall stable on physical exam he has mild rhonchorous breath sounds, normal sinus rhythm, abdomen is nontender nondistended, no lower extremity edema, he is alert awake oriented X2 but frequently gets delirious and restless, poor judgment and insight.  Continue plan as previously discussed.  Overall he stable to be discharged.  For now we will discontinue IV fluids, add bronchodilators and incentive spirometer.  Continue supportive care and supplemental oxygen as needed.  Discussed with RN  Stephania Fragmin MD [facility]

## 2020-01-29 ENCOUNTER — Inpatient Hospital Stay (HOSPITAL_COMMUNITY): Payer: Medicare Other

## 2020-01-29 LAB — BASIC METABOLIC PANEL
Anion gap: 9 (ref 5–15)
BUN: 19 mg/dL (ref 8–23)
CO2: 32 mmol/L (ref 22–32)
Calcium: 8.7 mg/dL — ABNORMAL LOW (ref 8.9–10.3)
Chloride: 100 mmol/L (ref 98–111)
Creatinine, Ser: 1.09 mg/dL (ref 0.61–1.24)
GFR, Estimated: >60 mL/min (ref >60)
Glucose, Bld: 104 mg/dL — ABNORMAL HIGH (ref 70–99)
Potassium: 3.8 mmol/L (ref 3.5–5.1)
Sodium: 141 mmol/L (ref 135–145)

## 2020-01-29 LAB — RESP PANEL BY RT-PCR (FLU A&B, COVID) ARPGX2
Influenza A by PCR: NEGATIVE
Influenza B by PCR: NEGATIVE
SARS Coronavirus 2 by RT PCR: NEGATIVE

## 2020-01-29 LAB — CBC
HCT: 40.9 % (ref 39.0–52.0)
Hemoglobin: 12.6 g/dL — ABNORMAL LOW (ref 13.0–17.0)
MCH: 31 pg (ref 26.0–34.0)
MCHC: 30.8 g/dL (ref 30.0–36.0)
MCV: 100.7 fL — ABNORMAL HIGH (ref 80.0–100.0)
Platelets: 252 10*3/uL (ref 150–400)
RBC: 4.06 MIL/uL — ABNORMAL LOW (ref 4.22–5.81)
RDW: 12.6 % (ref 11.5–15.5)
WBC: 11.7 10*3/uL — ABNORMAL HIGH (ref 4.0–10.5)
nRBC: 0 % (ref 0.0–0.2)

## 2020-01-29 LAB — MAGNESIUM: Magnesium: 2.1 mg/dL (ref 1.7–2.4)

## 2020-01-29 LAB — BRAIN NATRIURETIC PEPTIDE: B Natriuretic Peptide: 67.8 pg/mL (ref 0.0–100.0)

## 2020-01-29 MED ORDER — IPRATROPIUM-ALBUTEROL 0.5-2.5 (3) MG/3ML IN SOLN: 3.0000 mL | Freq: Two times a day (BID) | RESPIRATORY_TRACT | Status: AC

## 2020-01-29 MED ORDER — KETOTIFEN FUMARATE 0.025 % OP SOLN: 1.0000 [drp] | Freq: Two times a day (BID) | OPHTHALMIC | 0 refills | Status: AC

## 2020-01-29 NOTE — TOC Progression Note (Signed)
Transition of Care [facility]) - Progression Note    Patient Details  Name: FAOLAN SPRINGFIELD MRN: [id number] Date of Birth: Aug 09, 1925  Transition [facility] Care [facility]) CM/SW Contact  Carley Hammed, [place] Phone Number: 01/29/2020, 1:05 PM  Clinical Narrative:    CSW spoke with [facility] and they are able to accept pt with insurance auth. [employer] not open today. Hopefully able to DC tomorrow. Covid test requested. SW will continue to follow.   Expected Discharge Plan: Skilled Nursing Facility Barriers to Discharge: Continued Medical Work up  Expected Discharge Plan and Services Expected Discharge Plan: Skilled Nursing Facility In-house Referral: Clinical Social Work Discharge Planning Services: CM Consult   Living arrangements for the past 2 months: Single Family Home Expected Discharge Date: 01/27/20                                     Social Determinants of Health (SDOH) Interventions    Readmission Risk Interventions No flowsheet data found.

## 2020-01-29 NOTE — Discharge Summary (Addendum)
Physician Discharge Summary  Shaun Moore:[id number] DOB: [birth year] DOA: 01/20/2020  PCP: Lupita Raider, MD  Admit date: 01/20/2020 Discharge date: 01/31/2020  Admitted From: Home Disposition: SNF  Recommendations for Outpatient Follow-up:  1. Follow up with PCP in 1-2 weeks 2. Please obtain BMP/CBC in one week your next doctors visit.  3. He usually needs a good night rest to be able to function properly in the morning therefore minimize any disturbance at night 4. Closely monitor his oral hydration and urine output 5. Supplemental oxygen as needed, wean off as appropriate 6. Adjust blood pressure medications as necessary. 7. 1 soft bowel movement daily. 8. Aspiration Precautions 9. Start Seroquel as prescribed, further adjust the dose as necessary.  Can get periodic EKG to ensure QTC is normal   Discharge Condition: Stable CODE STATUS: DNR Diet recommendation: Regular Diet.   Brief/Interim Summary: 86 year old with history of Alzheimer, BPH, HTN, GERD, back pain, osteoarthritis who lives with wife son and daughter-in-law has been progressively getting weaker over the course the past week, dehydrated and agitated.  Upon admission he was also delirious in the hospital.  His trauma work-up was negative, no evidence of infection was noted.  PT recommended SNF.  His encephalopathy slowly improved with IV fluid and some rest.  Ongoing arrangements for SNF placement [place] updated on d/c.   Assessment & Plan:   Principal Problem:   AMS (altered mental status) Active Problems:   Coronary artery disease involving native coronary artery of native heart without angina pectoris   Essential hypertension, benign   Frequent falls   Alzheimer's dementia with behavioral disturbance (HCC)   Rhabdomyolysis   Acute metabolic encephalopathy -Does have underlying Alzheimer's but his encephalopathy is slowly improving with IV fluids and supportive care.  PT is recommending SNF therefore  arrangements being made -Continue his home regimen of Seroquel, Exelon and Namenda -He needs good night rest so he can participate in therapy during the day.  Acute respiratory failure with hypoxia -Likely from some atelectasis.  Needs to be out of bed to chair as much as possible and ambulate.  Incentive spirometer and bronchodilators if needed  Hypertension/tachycardia Coreg 6.25 mg twice daily and losartan. Continue to monitor.  CKD stage IIIb -IV fluids as needed.  Monitor urine output.  Colonic ileus Diarrhea -Bowel regimen to have 1 soft bowel movement daily  Hypothyroidism -Continue Synthroid  History of BPH -Continue Flomax and Proscar   Body mass index is 27.62 kg/m.         Discharge Diagnoses:  Principal Problem:   AMS (altered mental status) Active Problems:   Coronary artery disease involving native coronary artery of native heart without angina pectoris   Essential hypertension, benign   Frequent falls   Alzheimer's dementia with behavioral disturbance (HCC)   Rhabdomyolysis   Subjective: Resting comfortably this morning, no acute events overnight but he was up most of the night.  Discharge Exam: Vitals:   01/31/20 0554 01/31/20 0739  BP: (!) 162/85 (!) 169/91  Pulse:  98  Resp: 19 18  Temp:  98.6 F (37 C)  SpO2:  100%   Vitals:   01/31/20 0424 01/31/20 0521 01/31/20 0554 01/31/20 0739  BP: (!) 189/97 (!) 192/109 (!) 162/85 (!) 169/91  Pulse:    98  Resp: 19  19 18   Temp: 98.5 F (36.9 C)   98.6 F (37 C)  TempSrc: Oral   Oral  SpO2: 96%   100%  Weight:  General: Pt is alert, awake, not in acute distress Cardiovascular: RRR, S1/S2 +, no rubs, no gallops Respiratory: CTA bilaterally, no wheezing, no rhonchi Abdominal: Soft, NT, ND, bowel sounds + Extremities: no edema, no cyanosis  Discharge Instructions   Allergies as of 01/31/2020      Reactions   Lisinopril Cough   Ambien [zolpidem Tartrate]    Stated  that is "made him crazy"      Medication List    TAKE these medications   aspirin 81 MG tablet Take 81 mg by mouth daily.   carvedilol 6.25 MG tablet Commonly known as: COREG Take 1 tablet (6.25 mg total) by mouth 2 (two) times daily with a meal.   finasteride 5 MG tablet Commonly known as: PROSCAR Take 5 mg by mouth daily.   ipratropium-albuterol 0.5-2.5 (3) MG/3ML Soln Commonly known as: DUONEB Take 3 mLs by nebulization 2 (two) times daily.   ketotifen 0.025 % ophthalmic solution Commonly known as: ZADITOR Place 1 drop into both eyes 2 (two) times daily for 7 days.   levothyroxine 25 MCG tablet Commonly known as: SYNTHROID Take 25 mcg by mouth daily before breakfast.   losartan 50 MG tablet Commonly known as: COZAAR Take 50 mg by mouth daily.   memantine 10 MG tablet Commonly known as: NAMENDA TAKE 1 TABLET BY MOUTH TWICE DAILY What changed:   how much to take  how to take this  when to take this  additional instructions   QUEtiapine 25 MG tablet Commonly known as: SEROQUEL Take 1 tablet (25 mg total) by mouth 2 (two) times daily.   rivastigmine 1.5 MG capsule Commonly known as: EXELON TAKE 2 CAPSULES BY MOUTH TWICE DAILY   simvastatin 20 MG tablet Commonly known as: ZOCOR Take 20 mg by mouth daily at 6 PM.   tamsulosin 0.4 MG Caps capsule Commonly known as: FLOMAX Take 0.4 mg by mouth daily.       Contact information for follow-up providers    Lupita Raider, MD. Schedule an appointment as soon as possible for a visit in 1 week(s).   Specialty: Family Medicine Contact information: [address] [place] [address] [phone]            Contact information for after-discharge care    Destination    [facility] .   Service: Skilled Nursing Contact information: [address] [place] [address] [phone]                 Allergies  Allergen Reactions  . Lisinopril Cough   . Ambien [Zolpidem Tartrate]     Stated that is "made him crazy"    You were cared for by a hospitalist during your hospital stay. If you have any questions about your discharge medications or the care you received while you were in the hospital after you are discharged, you can call the unit and asked to speak with the hospitalist on call if the hospitalist that took care of you is not available. Once you are discharged, your primary care physician will handle any further medical issues. Please note that no refills for any discharge medications will be authorized once you are discharged, as it is imperative that you return to your primary care physician (or establish a relationship with a primary care physician if you do not have one) for your aftercare needs so that they can reassess your need for medications and monitor your lab values.   Procedures/Studies: [employer]  Chest 2 View  Result Date: 01/20/2020 CLINICAL DATA:  Pain generally in the back and chest. Patient unable to state specifically what is hurting. History of hypertension. EXAM: CHEST - 2 VIEW COMPARISON:  10/19/2018 FINDINGS: Heart is normal in size. No mediastinal or hilar masses. No evidence of adenopathy. Mildly atelectasis above and elevated right hemidiaphragm, similar to the prior study. Lungs otherwise clear. No pleural effusion.  No pneumothorax. Skeletal structures are intact. IMPRESSION: No active cardiopulmonary disease. Electronically Signed   By: Amie Portland M.D.   On: 01/20/2020 13:15   DG Lumbar Spine Complete  Result Date: 01/20/2020 CLINICAL DATA:  Back pain. EXAM: LUMBAR SPINE - COMPLETE 4+ VIEW COMPARISON:  Lumbar MRI, 10/21/2006 FINDINGS: No fracture. No spondylolisthesis or and no bone lesion. There is dense material from prior vertebroplasty in the L3 vertebra. Minor loss of disc height at L2-L3-L3-L4. Mild loss of disc height at L4-L5. There are small endplate osteophytes. Mild facet degenerative change  bilaterally at L4-L5 and L5-S1. Calcifications are scattered throughout the abdominal aorta. IMPRESSION: 1. No fracture or acute finding. 2. Previous L3 vertebroplasty. 3. Mild disc and facet degenerative changes as detailed. Electronically Signed   By: Amie Portland M.D.   On: 01/20/2020 13:14   DG Abd 1 View  Result Date: 01/27/2020 CLINICAL DATA:  Abdominal distension EXAM: ABDOMEN - 1 VIEW COMPARISON:  January 21, 2020 abdominal radiograph and CT abdomen and pelvis FINDINGS: Moderate stool in colon. No appreciable bowel dilatation or air-fluid level to suggest bowel obstruction. No free air. Probable phleboliths in the pelvis. Apparent prior vertebroplasty at L3. IMPRESSION: No bowel obstruction or free air evident on supine examination. Electronically Signed   By: Bretta Bang III M.D.   On: 01/27/2020 10:40   CT Head Wo Contrast  Result Date: 01/20/2020 CLINICAL DATA:  Head trauma, minor. Additional history provided: Fall. EXAM: CT HEAD WITHOUT CONTRAST CT CERVICAL SPINE WITHOUT CONTRAST TECHNIQUE: Multidetector CT imaging of the head and cervical spine was performed following the standard protocol without intravenous contrast. Multiplanar CT image reconstructions of the cervical spine were also generated. COMPARISON:  Head CT 08/17/2017.  Brain MRI 02/28/2016. FINDINGS: CT HEAD FINDINGS Brain: The examination is motion degraded at the level of the lower cerebrum, skull base, orbits, maxillofacial structures and posterior fossa. Cerebral and cerebellar atrophy. Mild ill-defined hypoattenuation within the cerebral white matter is nonspecific, but compatible with chronic small vessel ischemic disease. There is no acute intracranial hemorrhage. No demarcated cortical infarct. No extra-axial fluid collection. No evidence of intracranial mass. No midline shift. Vascular: No hyperdense vessel.  Atherosclerotic calcifications. Skull: Normal. Negative for fracture or focal lesion. Sinuses/Orbits:  Visualized orbits show no acute finding. No significant paranasal sinus disease at the imaged levels. CT CERVICAL SPINE FINDINGS Alignment: Straightening of the expected cervical lordosis. Trace C3-C4 grade 1 retrolisthesis. Skull base and vertebrae: The basion-dental and atlanto-dental intervals are maintained.No evidence of acute fracture to the cervical spine. Soft tissues and spinal canal: No prevertebral fluid or swelling. No visible canal hematoma. Disc levels: Cervical spondylosis with multilevel disc space narrowing, disc bulges and uncovertebral hypertrophy. Disc space narrowing is advanced at C3-C4, C4-C5, C5-C6 and C6-C7. Prominent multilevel ventrolateral osteophytes, most notably at C7-T1. Upper chest: No visible airspace consolidation or pneumothorax. Other: Atherosclerotic calcifications within the visualized aortic arch, proximal major branch vessels of the neck and carotid bifurcations. IMPRESSION: CT head: 1. Motion degraded examination as described. 2. No acute intracranial abnormality is identified. 3. Cerebral and cerebellar atrophy with  mild chronic small vessel ischemic disease. CT cervical spine: 1. No evidence of acute fracture to the cervical spine. 2. Mild C3-C4 grade 1 retrolisthesis. 3. Cervical spondylosis as described. 4.  Aortic Atherosclerosis (ICD10-I70.0). Electronically Signed   By: Jackey Loge DO   On: 01/20/2020 13:27   CT Cervical Spine Wo Contrast  Result Date: 01/20/2020 CLINICAL DATA:  Head trauma, minor. Additional history provided: Fall. EXAM: CT HEAD WITHOUT CONTRAST CT CERVICAL SPINE WITHOUT CONTRAST TECHNIQUE: Multidetector CT imaging of the head and cervical spine was performed following the standard protocol without intravenous contrast. Multiplanar CT image reconstructions of the cervical spine were also generated. COMPARISON:  Head CT 08/17/2017.  Brain MRI 02/28/2016. FINDINGS: CT HEAD FINDINGS Brain: The examination is motion degraded at the level of the  lower cerebrum, skull base, orbits, maxillofacial structures and posterior fossa. Cerebral and cerebellar atrophy. Mild ill-defined hypoattenuation within the cerebral white matter is nonspecific, but compatible with chronic small vessel ischemic disease. There is no acute intracranial hemorrhage. No demarcated cortical infarct. No extra-axial fluid collection. No evidence of intracranial mass. No midline shift. Vascular: No hyperdense vessel.  Atherosclerotic calcifications. Skull: Normal. Negative for fracture or focal lesion. Sinuses/Orbits: Visualized orbits show no acute finding. No significant paranasal sinus disease at the imaged levels. CT CERVICAL SPINE FINDINGS Alignment: Straightening of the expected cervical lordosis. Trace C3-C4 grade 1 retrolisthesis. Skull base and vertebrae: The basion-dental and atlanto-dental intervals are maintained.No evidence of acute fracture to the cervical spine. Soft tissues and spinal canal: No prevertebral fluid or swelling. No visible canal hematoma. Disc levels: Cervical spondylosis with multilevel disc space narrowing, disc bulges and uncovertebral hypertrophy. Disc space narrowing is advanced at C3-C4, C4-C5, C5-C6 and C6-C7. Prominent multilevel ventrolateral osteophytes, most notably at C7-T1. Upper chest: No visible airspace consolidation or pneumothorax. Other: Atherosclerotic calcifications within the visualized aortic arch, proximal major branch vessels of the neck and carotid bifurcations. IMPRESSION: CT head: 1. Motion degraded examination as described. 2. No acute intracranial abnormality is identified. 3. Cerebral and cerebellar atrophy with mild chronic small vessel ischemic disease. CT cervical spine: 1. No evidence of acute fracture to the cervical spine. 2. Mild C3-C4 grade 1 retrolisthesis. 3. Cervical spondylosis as described. 4.  Aortic Atherosclerosis (ICD10-I70.0). Electronically Signed   By: Jackey Loge DO   On: 01/20/2020 13:27   CT ABDOMEN  PELVIS W CONTRAST  Result Date: 01/21/2020 CLINICAL DATA:  Abdominal distention EXAM: CT ABDOMEN AND PELVIS WITH CONTRAST TECHNIQUE: Multidetector CT imaging of the abdomen and pelvis was performed using the standard protocol following bolus administration of intravenous contrast. CONTRAST:  OMNIPAQUE IOHEXOL 300 MG/ML  SOLN COMPARISON:  None. FINDINGS: Lower chest: Calcifications in the visualized coronary arteries and descending thoracic aorta. Heart is normal size. No acute abnormality. Hepatobiliary: No focal hepatic abnormality. Gallbladder unremarkable. Pancreas: No focal abnormality or ductal dilatation. Spleen: No focal abnormality.  Normal size. Adrenals/Urinary Tract: 2.4 cm cyst in the midpole of the left kidney. No hydronephrosis. Small cyst in the midpole of the right kidney. Adrenal glands and urinary bladder unremarkable. Stomach/Bowel: Normal appendix. Extensive colonic diverticulosis in the descending colon and sigmoid colon. No active diverticulitis. Colon is distended with stool and fluid. Stomach and small bowel decompressed, unremarkable. Vascular/Lymphatic: Heavily calcified aorta and iliac vessels. No evidence of aneurysm or adenopathy. Reproductive: Prostate enlargement. Other: No free fluid or free air. Small left inguinal hernia containing fat. Musculoskeletal: No acute bony abnormality. Prior vertebroplasty changes at L3. IMPRESSION: Extensive left colonic diverticulosis.  No active diverticulitis. Gaseous distension of the colon with layering fluid and moderate stool. This could reflect colonic ileus. Normal appendix. Aortic atherosclerosis.  Coronary artery disease. Prostate enlargement. Left inguinal hernia containing fat. Electronically Signed   By: Charlett Nose M.D.   On: 01/21/2020 17:19   DG Chest Port 1 View  Result Date: 01/29/2020 CLINICAL DATA:  Shortness of breath. EXAM: PORTABLE CHEST 1 VIEW COMPARISON:  Chest x-ray 01/20/2020. FINDINGS: Mediastinum and hilar  structures normal. Heart size normal. Low lung volumes with mild bibasilar atelectasis. Tiny left pleural effusion cannot be excluded. No pneumothorax. Degenerative change thoracic spine. IMPRESSION: Low lung volumes with mild bibasilar atelectasis. Tiny left pleural effusion cannot be excluded. Electronically Signed   By: Maisie Fus  Register   On: 01/29/2020 13:38   DG Abd Portable 1V  Result Date: 01/21/2020 CLINICAL DATA:  Rigid abdomen. EXAM: PORTABLE ABDOMEN - 1 VIEW COMPARISON:  11/13/2006. FINDINGS: Several loops of slightly prominent small bowel noted. Distention of the right and transverse colon up to 8 cm noted although adynamic ileus could present this fashion. Follow-up exam suggested demonstrate resolution to exclude bowel obstruction. No free air identified. Degenerative change lumbar spine. Pelvic calcifications consistent phleboliths. IMPRESSION: Several loops of slightly prominent small bowel noted. Distention of the right and transverse colon up to 8 cm noted. Although adynamic ileus could present in this fashion. Follow-up exam suggested to demonstrate resolution to exclude bowel obstruction. Electronically Signed   By: Maisie Fus  Register   On: 01/21/2020 12:22     The results of significant diagnostics from this hospitalization (including imaging, microbiology, ancillary and laboratory) are listed below for reference.     Microbiology: Recent Results (from the past 240 hour(s))  Respiratory Panel by RT PCR (Flu A&B, Covid) - Nasopharyngeal Swab     Status: None   Collection Time: 01/27/20 11:54 AM   Specimen: Nasopharyngeal Swab; Nasopharyngeal(NP) swabs in vial transport medium  Result Value Ref Range Status   SARS Coronavirus 2 by RT PCR NEGATIVE NEGATIVE Final    Comment: (NOTE) SARS-CoV-2 target nucleic acids are NOT DETECTED.  The SARS-CoV-2 RNA is generally detectable in upper respiratoy specimens during the acute phase of infection. The lowest concentration of SARS-CoV-2  viral copies this assay can detect is 131 copies/mL. A negative result does not preclude SARS-Cov-2 infection and should not be used as the sole basis for treatment or other patient management decisions. A negative result may occur with  improper specimen collection/handling, submission of specimen other than nasopharyngeal swab, presence of viral mutation(s) within the areas targeted by this assay, and inadequate number of viral copies (<131 copies/mL). A negative result must be combined with clinical observations, patient history, and epidemiological information. The expected result is Negative.  Fact Sheet for Patients:  https://www.moore.com/  Fact Sheet for Healthcare Providers:  https://www.young.biz/  This test is no t yet approved or cleared by the [place] FDA and  has been authorized for detection and/or diagnosis of SARS-CoV-2 by FDA under an Emergency Use Authorization (EUA). This EUA will remain  in effect (meaning this test can be used) for the duration of the COVID-19 declaration under Section 564(b)(1) of the Act, 21 U.S.C. section 360bbb-3(b)(1), unless the authorization is terminated or revoked sooner.     Influenza A by PCR NEGATIVE NEGATIVE Final   Influenza B by PCR NEGATIVE NEGATIVE Final    Comment: (NOTE) The Xpert Xpress SARS-CoV-2/FLU/RSV assay is intended as an aid in  the diagnosis of influenza from Nasopharyngeal swab  specimens and  should not be used as a sole basis for treatment. Nasal washings and  aspirates are unacceptable for Xpert Xpress SARS-CoV-2/FLU/RSV  testing.  Fact Sheet for Patients: https://www.moore.com/  Fact Sheet for Healthcare Providers: https://www.young.biz/  This test is not yet approved or cleared by the [place] FDA and  has been authorized for detection and/or diagnosis of SARS-CoV-2 by  FDA under an Emergency Use Authorization (EUA).  This EUA will remain  in effect (meaning this test can be used) for the duration of the  Covid-19 declaration under Section 564(b)(1) of the Act, 21  U.S.C. section 360bbb-3(b)(1), unless the authorization is  terminated or revoked. Performed at [facility], [address], [place], [place] [address]   Resp Panel by RT-PCR (Flu A&B, Covid) Nasopharyngeal Swab     Status: None   Collection Time: 01/29/20  4:47 PM   Specimen: Nasopharyngeal Swab; Nasopharyngeal(NP) swabs in vial transport medium  Result Value Ref Range Status   SARS Coronavirus 2 by RT PCR NEGATIVE NEGATIVE Final    Comment: (NOTE) SARS-CoV-2 target nucleic acids are NOT DETECTED.  The SARS-CoV-2 RNA is generally detectable in upper respiratory specimens during the acute phase of infection. The lowest concentration of SARS-CoV-2 viral copies this assay can detect is 138 copies/mL. A negative result does not preclude SARS-Cov-2 infection and should not be used as the sole basis for treatment or other patient management decisions. A negative result may occur with  improper specimen collection/handling, submission of specimen other than nasopharyngeal swab, presence of viral mutation(s) within the areas targeted by this assay, and inadequate number of viral copies(<138 copies/mL). A negative result must be combined with clinical observations, patient history, and epidemiological information. The expected result is Negative.  Fact Sheet for Patients:  BloggerCourse.com  Fact Sheet for Healthcare Providers:  SeriousBroker.it  This test is no t yet approved or cleared by the [place] FDA and  has been authorized for detection and/or diagnosis of SARS-CoV-2 by FDA under an Emergency Use Authorization (EUA). This EUA will remain  in effect (meaning this test can be used) for the duration of the COVID-19 declaration under Section 564(b)(1) of the Act,  21 U.S.C.section 360bbb-3(b)(1), unless the authorization is terminated  or revoked sooner.       Influenza A by PCR NEGATIVE NEGATIVE Final   Influenza B by PCR NEGATIVE NEGATIVE Final    Comment: (NOTE) The Xpert Xpress SARS-CoV-2/FLU/RSV plus assay is intended as an aid in the diagnosis of influenza from Nasopharyngeal swab specimens and should not be used as a sole basis for treatment. Nasal washings and aspirates are unacceptable for Xpert Xpress SARS-CoV-2/FLU/RSV testing.  Fact Sheet for Patients: BloggerCourse.com  Fact Sheet for Healthcare Providers: SeriousBroker.it  This test is not yet approved or cleared by the [place] FDA and has been authorized for detection and/or diagnosis of SARS-CoV-2 by FDA under an Emergency Use Authorization (EUA). This EUA will remain in effect (meaning this test can be used) for the duration of the COVID-19 declaration under Section 564(b)(1) of the Act, 21 U.S.C. section 360bbb-3(b)(1), unless the authorization is terminated or revoked.  Performed at [facility], [address] [place], [place] [address]      Labs: BNP (last 3 results) Recent Labs    01/29/20 0401  BNP 67.8   Basic Metabolic Panel: Recent Labs  Lab 01/25/20 0233 01/26/20 0328 01/27/20 0438 01/28/20 0259 01/29/20 0401 01/30/20 0327 01/31/20 0331  NA 141   < >  142 144 141 141 140  K 3.5   < > 4.1 4.0 3.8 3.1* 4.1  CL 103   < > 107 105 100 98 94*  CO2 27   < > 24 31 32 34* 32  GLUCOSE 122*   < > 100* 103* 104* 117* 123*  BUN 26*   < > 26* 21 19 15 11   CREATININE 1.25*   < > 1.28* 1.15 1.09 1.06 1.01  CALCIUM 8.5*   < > 8.4* 8.5* 8.7* 8.6* 8.8*  MG 1.8  --  2.0 2.1 2.1 1.9 1.9  PHOS 3.2  --  3.4  --   --   --   --    < > = values in this interval not displayed.   Liver Function Tests: No results for input(s): AST, ALT, ALKPHOS, BILITOT, PROT, ALBUMIN in the last 168 hours. No results  for input(s): LIPASE, AMYLASE in the last 168 hours. No results for input(s): AMMONIA in the last 168 hours. CBC: Recent Labs  Lab 01/25/20 0233 01/25/20 0233 01/26/20 0328 01/26/20 0328 01/27/20 0438 01/28/20 0259 01/29/20 0401 01/30/20 0327 01/31/20 0331  WBC 10.7*   < > 11.6*   < > 13.8* 10.2 11.7* 13.5* 12.3*  NEUTROABS 7.5  --  8.5*  --  9.7*  --   --   --   --   HGB 13.2   < > 13.4   < > 13.0 12.3* 12.6* 13.0 14.2  HCT 41.9   < > 43.1   < > 42.6 40.4 40.9 41.7 42.9  MCV 99.5   < > 99.3   < > 102.9* 102.3* 100.7* 99.3 94.5  PLT 303   < > 300   < > 260 267 252 272 274   < > = values in this interval not displayed.   Cardiac Enzymes: No results for input(s): CKTOTAL, CKMB, CKMBINDEX, TROPONINI in the last 168 hours. BNP: Invalid input(s): POCBNP CBG: No results for input(s): GLUCAP in the last 168 hours. D-Dimer No results for input(s): DDIMER in the last 72 hours. Hgb A1c No results for input(s): HGBA1C in the last 72 hours. Lipid Profile No results for input(s): CHOL, HDL, LDLCALC, TRIG, CHOLHDL, LDLDIRECT in the last 72 hours. Thyroid function studies No results for input(s): TSH, T4TOTAL, T3FREE, THYROIDAB in the last 72 hours.  Invalid input(s): FREET3 Anemia work up No results for input(s): VITAMINB12, FOLATE, FERRITIN, TIBC, IRON, RETICCTPCT in the last 72 hours. Urinalysis    Component Value Date/Time   COLORURINE RED (A) 01/21/2020 0954   APPEARANCEUR TURBID (A) 01/21/2020 0954   LABSPEC  01/21/2020 0954    TEST NOT REPORTED DUE TO COLOR INTERFERENCE OF URINE PIGMENT   PHURINE  01/21/2020 0954    TEST NOT REPORTED DUE TO COLOR INTERFERENCE OF URINE PIGMENT   GLUCOSEU (A) 01/21/2020 0954    TEST NOT REPORTED DUE TO COLOR INTERFERENCE OF URINE PIGMENT   HGBUR (A) 01/21/2020 0954    TEST NOT REPORTED DUE TO COLOR INTERFERENCE OF URINE PIGMENT   BILIRUBINUR (A) 01/21/2020 0954    TEST NOT REPORTED DUE TO COLOR INTERFERENCE OF URINE PIGMENT   KETONESUR  (A) 01/21/2020 0954    TEST NOT REPORTED DUE TO COLOR INTERFERENCE OF URINE PIGMENT   PROTEINUR (A) 01/21/2020 0954    TEST NOT REPORTED DUE TO COLOR INTERFERENCE OF URINE PIGMENT   UROBILINOGEN 0.2 11/09/2006 2014   NITRITE (A) 01/21/2020 0954    TEST NOT REPORTED DUE TO COLOR  INTERFERENCE OF URINE PIGMENT   LEUKOCYTESUR (A) 01/21/2020 0954    TEST NOT REPORTED DUE TO COLOR INTERFERENCE OF URINE PIGMENT   Sepsis Labs Invalid input(s): PROCALCITONIN,  WBC,  LACTICIDVEN Microbiology Recent Results (from the past 240 hour(s))  Respiratory Panel by RT PCR (Flu A&B, Covid) - Nasopharyngeal Swab     Status: None   Collection Time: 01/27/20 11:54 AM   Specimen: Nasopharyngeal Swab; Nasopharyngeal(NP) swabs in vial transport medium  Result Value Ref Range Status   SARS Coronavirus 2 by RT PCR NEGATIVE NEGATIVE Final    Comment: (NOTE) SARS-CoV-2 target nucleic acids are NOT DETECTED.  The SARS-CoV-2 RNA is generally detectable in upper respiratoy specimens during the acute phase of infection. The lowest concentration of SARS-CoV-2 viral copies this assay can detect is 131 copies/mL. A negative result does not preclude SARS-Cov-2 infection and should not be used as the sole basis for treatment or other patient management decisions. A negative result may occur with  improper specimen collection/handling, submission of specimen other than nasopharyngeal swab, presence of viral mutation(s) within the areas targeted by this assay, and inadequate number of viral copies (<131 copies/mL). A negative result must be combined with clinical observations, patient history, and epidemiological information. The expected result is Negative.  Fact Sheet for Patients:  https://www.moore.com/  Fact Sheet for Healthcare Providers:  https://www.young.biz/  This test is no t yet approved or cleared by the [place] FDA and  has been authorized for detection  and/or diagnosis of SARS-CoV-2 by FDA under an Emergency Use Authorization (EUA). This EUA will remain  in effect (meaning this test can be used) for the duration of the COVID-19 declaration under Section 564(b)(1) of the Act, 21 U.S.C. section 360bbb-3(b)(1), unless the authorization is terminated or revoked sooner.     Influenza A by PCR NEGATIVE NEGATIVE Final   Influenza B by PCR NEGATIVE NEGATIVE Final    Comment: (NOTE) The Xpert Xpress SARS-CoV-2/FLU/RSV assay is intended as an aid in  the diagnosis of influenza from Nasopharyngeal swab specimens and  should not be used as a sole basis for treatment. Nasal washings and  aspirates are unacceptable for Xpert Xpress SARS-CoV-2/FLU/RSV  testing.  Fact Sheet for Patients: https://www.moore.com/  Fact Sheet for Healthcare Providers: https://www.young.biz/  This test is not yet approved or cleared by the [place] FDA and  has been authorized for detection and/or diagnosis of SARS-CoV-2 by  FDA under an Emergency Use Authorization (EUA). This EUA will remain  in effect (meaning this test can be used) for the duration of the  Covid-19 declaration under Section 564(b)(1) of the Act, 21  U.S.C. section 360bbb-3(b)(1), unless the authorization is  terminated or revoked. Performed at [facility], [address] [place], [place] [address]   Resp Panel by RT-PCR (Flu A&B, Covid) Nasopharyngeal Swab     Status: None   Collection Time: 01/29/20  4:47 PM   Specimen: Nasopharyngeal Swab; Nasopharyngeal(NP) swabs in vial transport medium  Result Value Ref Range Status   SARS Coronavirus 2 by RT PCR NEGATIVE NEGATIVE Final    Comment: (NOTE) SARS-CoV-2 target nucleic acids are NOT DETECTED.  The SARS-CoV-2 RNA is generally detectable in upper respiratory specimens during the acute phase of infection. The lowest concentration of SARS-CoV-2 viral copies this assay can detect is 138  copies/mL. A negative result does not preclude SARS-Cov-2 infection and should not be used as the sole basis for treatment or other patient management decisions. A negative result may  occur with  improper specimen collection/handling, submission of specimen other than nasopharyngeal swab, presence of viral mutation(s) within the areas targeted by this assay, and inadequate number of viral copies(<138 copies/mL). A negative result must be combined with clinical observations, patient history, and epidemiological information. The expected result is Negative.  Fact Sheet for Patients:  BloggerCourse.com  Fact Sheet for Healthcare Providers:  SeriousBroker.it  This test is no t yet approved or cleared by the [place] FDA and  has been authorized for detection and/or diagnosis of SARS-CoV-2 by FDA under an Emergency Use Authorization (EUA). This EUA will remain  in effect (meaning this test can be used) for the duration of the COVID-19 declaration under Section 564(b)(1) of the Act, 21 U.S.C.section 360bbb-3(b)(1), unless the authorization is terminated  or revoked sooner.       Influenza A by PCR NEGATIVE NEGATIVE Final   Influenza B by PCR NEGATIVE NEGATIVE Final    Comment: (NOTE) The Xpert Xpress SARS-CoV-2/FLU/RSV plus assay is intended as an aid in the diagnosis of influenza from Nasopharyngeal swab specimens and should not be used as a sole basis for treatment. Nasal washings and aspirates are unacceptable for Xpert Xpress SARS-CoV-2/FLU/RSV testing.  Fact Sheet for Patients: BloggerCourse.com  Fact Sheet for Healthcare Providers: SeriousBroker.it  This test is not yet approved or cleared by the [place] FDA and has been authorized for detection and/or diagnosis of SARS-CoV-2 by FDA under an Emergency Use Authorization (EUA). This EUA will remain in effect (meaning  this test can be used) for the duration of the COVID-19 declaration under Section 564(b)(1) of the Act, 21 U.S.C. section 360bbb-3(b)(1), unless the authorization is terminated or revoked.  Performed at [facility], [address] [place], [place] [address]      Time coordinating discharge:  I have spent 35 minutes face to face with the patient and on the ward discussing the patients care, assessment, plan and disposition with other care givers. >50% of the time was devoted counseling the patient about the risks and benefits of treatment/Discharge disposition and coordinating care.   SIGNED:   Dimple Nanas, MD  [facility] 01/31/2020, 8:58 AM   If 7PM-7AM, please contact night-coverage

## 2020-01-29 NOTE — Progress Notes (Signed)
Physical Therapy Treatment Patient Details Name: Shaun Moore MRN: [id number] DOB: [birth year] Today's Date: 01/29/2020    History of Present Illness 86 y.o. male who was brought to the ED on 01/20/2020 for multiple falls, altered mental status.  PMH significant for Alzheimer's dementia, BPH, hypertension, GERD, back pain and OA. Patient fell about a week ago and struck his head on the ground but refused to be evaluated at the time.  Over the last 1 week, he has become increasingly agitated, more confused and difficult to reorient.  His gait is unsteady and he has had multiple falls despite the use of RW. Pt fell on 11/17 and was brought to the ED due to LBP.    PT Comments    Pt very lethargic this date with him opening his eyes 3x briefly during session, significantly limiting his ability to participate safely and progress with PT this session. Upon arrival, [name] was on floor and SpO2 was 88-91%, therefore donned [name] at 2L/min O2 with SpO2 remaining 90-92% throughout session. Pt required extensive assistance to initiate sitting up in recliner and to initiate transfers to stand x2 reps. He required maxA to come to stand and modA to maintain it with use of RW due to his significant posterior lean and trunk flexion. Held off on gait training due to unsafe level of lethargy and lack of second personnel to assist this date. Will continue to follow acutely. Current recommendations remain appropriate.   Follow Up Recommendations  SNF     Equipment Recommendations  Wheelchair (measurements PT);Hospital bed    Recommendations for Other Services       Precautions / Restrictions Precautions Precautions: Fall Precaution Comments: watch O2 Restrictions Weight Bearing Restrictions: No    Mobility  Bed Mobility               General bed mobility comments: Pt sitting up in recliner upon arrival.  Transfers Overall transfer level: Needs assistance Equipment used: Rolling walker (2  wheeled) Transfers: Sit to/from Stand Sit to Stand: [name] assist         General transfer comment: Pt lethargic and required assistance to initiate transfer with pt participating after initiation. Placed pt's hands on chair but pt transferred them to RW prior to stand repeatedly. Strong posterior lean and trunk flexion, thus maxA to come to stand.  Ambulation/Gait             General Gait Details: Unsafe to ambulate this date due to pt lethargy and lack of second personnel   Stairs             Wheelchair Mobility    Modified Rankin (Stroke Patients Only)       Balance Overall balance assessment: Needs assistance Sitting-balance support: Feet supported;Bilateral upper extremity supported Sitting balance-[name] Scale: Poor Sitting balance - Comments: Attempted unsupported back sitting in reclienr with UE support but pt lethargic and leans posteriorly. Requires maxA to come to upright sitting and progresses to min guard assist to maintain for short periods of time. Postural control: Posterior lean Standing balance support: Bilateral upper extremity supported Standing balance-[name] Scale: Poor Standing balance comment: UE support on RW, displaying excessive trunk flexion and posterior lean. x2 standing bouts lasting ~60 sec each, modA to maintain balance with tactile and verbal cues at pelvis and chest to improve posture with momentary success.  Cognition Arousal/Alertness: Lethargic Behavior During Therapy: Flat affect Overall Cognitive Status: No family/caregiver present to determine baseline cognitive functioning Area of Impairment: Attention;Following commands;Safety/judgement;Awareness;Problem solving                   Current Attention Level: Alternating   Following Commands: Follows one step commands inconsistently;Follows one step commands with increased time Safety/Judgement: Decreased awareness of safety;Decreased  awareness of deficits Awareness: Intellectual Problem Solving: Slow processing;Difficulty sequencing;Decreased initiation;Requires verbal cues;Requires tactile cues General Comments: Pt very lethargic and difficulty opening eyes during session. Required increased time to process and required assistance to initiate all tasks.       Exercises      General Comments General comments (skin integrity, edema, etc.): Upon arrival, nasal cannula on ground and SpO2 88-91%, donned [name] at 2L/min and SpO2 remained 90-92% throughout session      Pertinent Vitals/Pain Pain Assessment: Faces Faces Pain Scale: No hurt Pain Intervention(s): Limited activity within patient's tolerance;Monitored during session;Repositioned    Home Living                      Prior Function            PT Goals (current goals can now be found in the care plan section) Acute Rehab PT Goals Patient Stated Goal: to sleep PT Goal Formulation: Patient unable to participate in goal setting Time For Goal Achievement: 02/05/20 Potential to Achieve Goals: Fair Progress towards PT goals: Not progressing toward goals - comment (pt very lethargic this date)    Frequency    Min 2X/week      PT Plan Current plan remains appropriate    Co-evaluation              AM-PAC PT "6 Clicks" Mobility   Outcome Measure  Help needed turning from your back to your side while in a flat bed without using bedrails?: A Little Help needed moving from lying on your back to sitting on the side of a flat bed without using bedrails?: A Lot Help needed moving to and from a bed to a chair (including a wheelchair)?: A Lot Help needed standing up from a chair using your arms (e.g., wheelchair or bedside chair)?: A Lot Help needed to walk in hospital room?: A Lot Help needed climbing 3-5 steps with a railing? : Total 6 Click Score: 12    End of Session Equipment Utilized During Treatment: Gait belt;Oxygen Activity Tolerance:  Patient limited by lethargy Patient left: in chair;with call bell/phone within reach;with chair alarm set Nurse Communication: Mobility status;Other (comment) ([name] on ground upon arrival) PT Visit Diagnosis: Unsteadiness on feet (R26.81);Muscle weakness (generalized) (M62.81);Difficulty in walking, not elsewhere classified (R26.2);History of falling (Z91.81)     Time: 9242-6834 PT Time Calculation (min) (ACUTE ONLY): 17 min  Charges:  $Therapeutic Activity: 8-22 mins                     Raymond Gurney, PT, DPT Acute Rehabilitation Services  Pager: [phone] Office: [phone]    Jewel Baize 01/29/2020, 1:07 PM

## 2020-01-30 LAB — CBC
HCT: 41.7 % (ref 39.0–52.0)
Hemoglobin: 13 g/dL (ref 13.0–17.0)
MCH: 31 pg (ref 26.0–34.0)
MCHC: 31.2 g/dL (ref 30.0–36.0)
MCV: 99.3 fL (ref 80.0–100.0)
Platelets: 272 10*3/uL (ref 150–400)
RBC: 4.2 MIL/uL — ABNORMAL LOW (ref 4.22–5.81)
RDW: 12.5 % (ref 11.5–15.5)
WBC: 13.5 10*3/uL — ABNORMAL HIGH (ref 4.0–10.5)
nRBC: 0 % (ref 0.0–0.2)

## 2020-01-30 LAB — BASIC METABOLIC PANEL
Anion gap: 9 (ref 5–15)
BUN: 15 mg/dL (ref 8–23)
CO2: 34 mmol/L — ABNORMAL HIGH (ref 22–32)
Calcium: 8.6 mg/dL — ABNORMAL LOW (ref 8.9–10.3)
Chloride: 98 mmol/L (ref 98–111)
Creatinine, Ser: 1.06 mg/dL (ref 0.61–1.24)
GFR, Estimated: >60 mL/min (ref >60)
Glucose, Bld: 117 mg/dL — ABNORMAL HIGH (ref 70–99)
Potassium: 3.1 mmol/L — ABNORMAL LOW (ref 3.5–5.1)
Sodium: 141 mmol/L (ref 135–145)

## 2020-01-30 LAB — MAGNESIUM: Magnesium: 1.9 mg/dL (ref 1.7–2.4)

## 2020-01-30 MED ORDER — POTASSIUM CHLORIDE 10 MEQ/100ML IV SOLN
10.0000 meq | INTRAVENOUS | Status: AC
  Administered 2020-01-30: 10 meq via INTRAVENOUS
  Filled 2020-01-30: qty 100

## 2020-01-30 MED ORDER — SODIUM CHLORIDE 0.9 % IV SOLN: INTRAVENOUS | Status: AC

## 2020-01-30 MED ORDER — HALOPERIDOL LACTATE 5 MG/ML IJ SOLN
1.0000 mg | Freq: Once | INTRAMUSCULAR | Status: AC
  Administered 2020-01-30: 1 mg via INTRAVENOUS

## 2020-01-30 MED ORDER — POTASSIUM CHLORIDE 20 MEQ/15ML (10%) PO SOLN
40.0000 meq | Freq: Once | ORAL | Status: AC
  Administered 2020-01-30: 40 meq via ORAL
  Filled 2020-01-30: qty 30

## 2020-01-30 NOTE — Progress Notes (Signed)
Bed alarm in patient's room sounded. Nurse came to patient's room to find him laying on the floor perpendicular to the bed. His head was resting on a blanket, and he was laying on a safety floor mat. He did not complain of pain. VS were stable. MD notified. Patient observed post fall for abnormalities. No c/o of discomfort or pain.

## 2020-01-30 NOTE — Progress Notes (Signed)
Sitting up in the chair no complaints.  Eating his breakfast with the help of nursing tech.  He was able to get up and go to the commode with assistance and move around.  No new complaints this morning Overnight slid out of bed but no obvious signs of trauma. Vital signs remained stable.  Discharge summary completed yesterday.  Call with questions as needed Discussed with RN.  Stephania Fragmin MD [facility]

## 2020-01-31 DIAGNOSIS — I2510 Atherosclerotic heart disease of native coronary artery without angina pectoris: Secondary | ICD-10-CM | POA: Diagnosis not present

## 2020-01-31 DIAGNOSIS — G309 Alzheimer's disease, unspecified: Secondary | ICD-10-CM | POA: Diagnosis not present

## 2020-01-31 DIAGNOSIS — R4182 Altered mental status, unspecified: Secondary | ICD-10-CM | POA: Diagnosis not present

## 2020-01-31 DIAGNOSIS — I10 Essential (primary) hypertension: Secondary | ICD-10-CM | POA: Diagnosis not present

## 2020-01-31 LAB — BASIC METABOLIC PANEL
Anion gap: 14 (ref 5–15)
BUN: 11 mg/dL (ref 8–23)
CO2: 32 mmol/L (ref 22–32)
Calcium: 8.8 mg/dL — ABNORMAL LOW (ref 8.9–10.3)
Chloride: 94 mmol/L — ABNORMAL LOW (ref 98–111)
Creatinine, Ser: 1.01 mg/dL (ref 0.61–1.24)
GFR, Estimated: >60 mL/min (ref >60)
Glucose, Bld: 123 mg/dL — ABNORMAL HIGH (ref 70–99)
Potassium: 4.1 mmol/L (ref 3.5–5.1)
Sodium: 140 mmol/L (ref 135–145)

## 2020-01-31 LAB — MAGNESIUM: Magnesium: 1.9 mg/dL (ref 1.7–2.4)

## 2020-01-31 LAB — CBC
HCT: 42.9 % (ref 39.0–52.0)
Hemoglobin: 14.2 g/dL (ref 13.0–17.0)
MCH: 31.3 pg (ref 26.0–34.0)
MCHC: 33.1 g/dL (ref 30.0–36.0)
MCV: 94.5 fL (ref 80.0–100.0)
Platelets: 274 10*3/uL (ref 150–400)
RBC: 4.54 MIL/uL (ref 4.22–5.81)
RDW: 12.4 % (ref 11.5–15.5)
WBC: 12.3 10*3/uL — ABNORMAL HIGH (ref 4.0–10.5)
nRBC: 0 % (ref 0.0–0.2)

## 2020-01-31 MED ORDER — QUETIAPINE FUMARATE 25 MG PO TABS
25.0000 mg | ORAL_TABLET | Freq: Two times a day (BID) | ORAL | Status: DC
  Administered 2020-01-31: 12.5 mg via ORAL
  Administered 2020-01-31 – 2020-02-01 (×3): 25 mg via ORAL
  Filled 2020-01-31 (×4): qty 1

## 2020-01-31 MED ORDER — QUETIAPINE FUMARATE 25 MG PO TABS
25.0000 mg | ORAL_TABLET | Freq: Two times a day (BID) | ORAL | Status: DC
Start: 2020-01-31 — End: 2020-02-05

## 2020-01-31 NOTE — TOC Progression Note (Signed)
Transition of Care [facility]) - Progression Note    Patient Details  Name: Shaun Moore MRN: [id number] Date of Birth: 03-Jul-1925  Transition of [facility]) CM/SW Contact  Carley Hammed, [place] Phone Number: 01/31/2020, 11:36 AM  Clinical Narrative:    CSW spoke with facility that they had concerns about the amount of Halidol that is being used. They cannot take pt unless he is well maintained, off of chemical restraints, for 24 to 48 hours. Dr. Was notified and switched medications to seroquel. [facility] and SW will continue to follow for DC. CSW left a message for Son to update him.   Expected Discharge Plan: Skilled Nursing Facility Barriers to Discharge: SNF Pending transportation, Barriers Resolved  Expected Discharge Plan and Services Expected Discharge Plan: Skilled Nursing Facility In-house Referral: Clinical Social Work Discharge Planning Services: CM Consult   Living arrangements for the past 2 months: Single Family Home Expected Discharge Date: 01/27/20                                     Social Determinants of Health (SDOH) Interventions    Readmission Risk Interventions No flowsheet data found.

## 2020-01-31 NOTE — Progress Notes (Signed)
Some agitation overnight but resting well this morning.  No other complaints. Seroquel increased to 25 mg twice daily. Discussed with patient's RN and TOC team.  Call with any further questions as needed. Discharge summary updated.  Stephania Fragmin MD [facility]

## 2020-01-31 NOTE — Progress Notes (Signed)
PROGRESS NOTE    Shaun Moore  HWE:[id number] DOB: [birth year] DOA: 01/20/2020 PCP: Lupita Raider, MD   Brief Narrative:  86 year old with history of Alzheimer, BPH, HTN, GERD, back pain, osteoarthritis who lives with wife son and daughter-in-law has been progressively getting weaker over the course the past week, dehydrated and agitated.  Upon admission he was also delirious in the hospital.  His trauma work-up was negative, no evidence of infection was noted.  PT recommended SNF.  His encephalopathy slowly improved with IV fluid and some rest.  Ongoing arrangements for SNF placement but while in the hospital he started having delirium episode requiring Haldol therefore Seroquel adjusted.   Assessment & Plan:   Principal Problem:   AMS (altered mental status) Active Problems:   Coronary artery disease involving native coronary artery of native heart without angina pectoris   Essential hypertension, benign   Frequent falls   Alzheimer's dementia with behavioral disturbance (HCC)   Rhabdomyolysis   Acute metabolic encephalopathy, delirium episodes -Does have underlying Alzheimer's but his encephalopathy is slowly improving with IV fluids and supportive care.  PT is recommending SNF therefore arrangements being made -Continue his home regimen of Exelon and Namenda.  Increase Seroquel to 25 mg twice daily -He needs good night rest so he can participate in therapy during the day.  Acute respiratory failure with hypoxia -Resolved.  Likely from atelectasis.  Out of bed to chair.  Hypertension/tachycardia Continue increased dose of Coreg 6.25 mg twice daily and losartan. Continue to monitor.  CKD stage IIIb -IV fluids as needed.  Monitor urine output.  Colonic ileus Diarrhea -This is improved.  Abdominal x-ray this morning is also negative.  Continue bowel regimen as needed  Hypothyroidism -Continue Synthroid  History of BPH -Continue Flomax and Proscar  DVT prophylaxis:  Lovenox Code Status: DNR Family Communication: Spoken with his son Jeannett Senior  Status is: Inpatient  Remains inpatient appropriate because:Unsafe d/c plan   Dispo: The patient is from: Home              Anticipated d/c is to: SNF              Anticipated d/c date is: 1 day              Patient currently is medically stable to d/c.  Skilled nursing facility will not take him due to Haldol use from his agitation overnight   Body mass index is 27.62 kg/m.      Subjective: Patient had episode of agitation overnight requiring Haldol.  This morning he appears calm and comfortable without any complaints.  Resting peacefully.   Examination: Constitutional: Not in acute distress, resting with his eyes closed Respiratory: Clear to auscultation bilaterally Cardiovascular: Normal sinus rhythm, no rubs Abdomen: Nontender nondistended good bowel sounds Musculoskeletal: No edema noted Skin: No rashes seen Neurologic: Unable to fully assess but grossly moving all the extremities Psychiatric: Alert to name and place.  Resting comfortably  Objective: Vitals:   01/31/20 0521 01/31/20 0554 01/31/20 0739 01/31/20 0918  BP: (!) 192/109 (!) 162/85 (!) 169/91   Pulse:   98   Resp:  19 18   Temp:   98.6 F (37 C)   TempSrc:   Oral   SpO2:   100% 96%  Weight:        Intake/Output Summary (Last 24 hours) at 01/31/2020 1111 Last data filed at 01/31/2020 1000 Gross per 24 hour  Intake 1321.03 ml  Output 900 ml  Net 421.03 ml  Filed Weights   01/20/20 2246  Weight: 87.3 kg     Data Reviewed:   CBC: Recent Labs  Lab 01/25/20 0233 01/25/20 0233 01/26/20 0328 01/26/20 0328 01/27/20 0438 01/28/20 0259 01/29/20 0401 01/30/20 0327 01/31/20 0331  WBC 10.7*   < > 11.6*   < > 13.8* 10.2 11.7* 13.5* 12.3*  NEUTROABS 7.5  --  8.5*  --  9.7*  --   --   --   --   HGB 13.2   < > 13.4   < > 13.0 12.3* 12.6* 13.0 14.2  HCT 41.9   < > 43.1   < > 42.6 40.4 40.9 41.7 42.9  MCV 99.5   < >  99.3   < > 102.9* 102.3* 100.7* 99.3 94.5  PLT 303   < > 300   < > 260 267 252 272 274   < > = values in this interval not displayed.   Basic Metabolic Panel: Recent Labs  Lab 01/25/20 0233 01/26/20 0328 01/27/20 0438 01/28/20 0259 01/29/20 0401 01/30/20 0327 01/31/20 0331  NA 141   < > 142 144 141 141 140  K 3.5   < > 4.1 4.0 3.8 3.1* 4.1  CL 103   < > 107 105 100 98 94*  CO2 27   < > 24 31 32 34* 32  GLUCOSE 122*   < > 100* 103* 104* 117* 123*  BUN 26*   < > 26* 21 19 15 11   CREATININE 1.25*   < > 1.28* 1.15 1.09 1.06 1.01  CALCIUM 8.5*   < > 8.4* 8.5* 8.7* 8.6* 8.8*  MG 1.8  --  2.0 2.1 2.1 1.9 1.9  PHOS 3.2  --  3.4  --   --   --   --    < > = values in this interval not displayed.   GFR: Estimated Creatinine Clearance: 48.2 mL/min (by C-G formula based on SCr of 1.01 mg/dL). Liver Function Tests: No results for input(s): AST, ALT, ALKPHOS, BILITOT, PROT, ALBUMIN in the last 168 hours. No results for input(s): LIPASE, AMYLASE in the last 168 hours. No results for input(s): AMMONIA in the last 168 hours. Coagulation Profile: No results for input(s): INR, PROTIME in the last 168 hours. Cardiac Enzymes: No results for input(s): CKTOTAL, CKMB, CKMBINDEX, TROPONINI in the last 168 hours. BNP (last 3 results) No results for input(s): PROBNP in the last 8760 hours. HbA1C: No results for input(s): HGBA1C in the last 72 hours. CBG: No results for input(s): GLUCAP in the last 168 hours. Lipid Profile: No results for input(s): CHOL, HDL, LDLCALC, TRIG, CHOLHDL, LDLDIRECT in the last 72 hours. Thyroid Function Tests: No results for input(s): TSH, T4TOTAL, FREET4, T3FREE, THYROIDAB in the last 72 hours. Anemia Panel: No results for input(s): VITAMINB12, FOLATE, FERRITIN, TIBC, IRON, RETICCTPCT in the last 72 hours. Sepsis Labs: No results for input(s): PROCALCITON, LATICACIDVEN in the last 168 hours.  Recent Results (from the past 240 hour(s))  Respiratory Panel by RT PCR  (Flu A&B, Covid) - Nasopharyngeal Swab     Status: None   Collection Time: 01/27/20 11:54 AM   Specimen: Nasopharyngeal Swab; Nasopharyngeal(NP) swabs in vial transport medium  Result Value Ref Range Status   SARS Coronavirus 2 by RT PCR NEGATIVE NEGATIVE Final    Comment: (NOTE) SARS-CoV-2 target nucleic acids are NOT DETECTED.  The SARS-CoV-2 RNA is generally detectable in upper respiratoy specimens during the acute phase of infection. The lowest concentration of  SARS-CoV-2 viral copies this assay can detect is 131 copies/mL. A negative result does not preclude SARS-Cov-2 infection and should not be used as the sole basis for treatment or other patient management decisions. A negative result may occur with  improper specimen collection/handling, submission of specimen other than nasopharyngeal swab, presence of viral mutation(s) within the areas targeted by this assay, and inadequate number of viral copies (<131 copies/mL). A negative result must be combined with clinical observations, patient history, and epidemiological information. The expected result is Negative.  Fact Sheet for Patients:  https://www.moore.com/https://www.fda.gov/media/142436/download  Fact Sheet for Healthcare Providers:  https://www.young.biz/https://www.fda.gov/media/142435/download  This test is no t yet approved or cleared by the [place] FDA and  has been authorized for detection and/or diagnosis of SARS-CoV-2 by FDA under an Emergency Use Authorization (EUA). This EUA will remain  in effect (meaning this test can be used) for the duration of the COVID-19 declaration under Section 564(b)(1) of the Act, 21 U.S.C. section 360bbb-3(b)(1), unless the authorization is terminated or revoked sooner.     Influenza A by PCR NEGATIVE NEGATIVE Final   Influenza B by PCR NEGATIVE NEGATIVE Final    Comment: (NOTE) The Xpert Xpress SARS-CoV-2/FLU/RSV assay is intended as an aid in  the diagnosis of influenza from Nasopharyngeal swab specimens and    should not be used as a sole basis for treatment. Nasal washings and  aspirates are unacceptable for Xpert Xpress SARS-CoV-2/FLU/RSV  testing.  Fact Sheet for Patients: https://www.moore.com/https://www.fda.gov/media/142436/download  Fact Sheet for Healthcare Providers: https://www.young.biz/https://www.fda.gov/media/142435/download  This test is not yet approved or cleared by the [place] FDA and  has been authorized for detection and/or diagnosis of SARS-CoV-2 by  FDA under an Emergency Use Authorization (EUA). This EUA will remain  in effect (meaning this test can be used) for the duration of the  Covid-19 declaration under Section 564(b)(1) of the Act, 21  U.S.C. section 360bbb-3(b)(1), unless the authorization is  terminated or revoked. Performed at [facility], [address] [place], [place] [address]   Resp Panel by RT-PCR (Flu A&B, Covid) Nasopharyngeal Swab     Status: None   Collection Time: 01/29/20  4:47 PM   Specimen: Nasopharyngeal Swab; Nasopharyngeal(NP) swabs in vial transport medium  Result Value Ref Range Status   SARS Coronavirus 2 by RT PCR NEGATIVE NEGATIVE Final    Comment: (NOTE) SARS-CoV-2 target nucleic acids are NOT DETECTED.  The SARS-CoV-2 RNA is generally detectable in upper respiratory specimens during the acute phase of infection. The lowest concentration of SARS-CoV-2 viral copies this assay can detect is 138 copies/mL. A negative result does not preclude SARS-Cov-2 infection and should not be used as the sole basis for treatment or other patient management decisions. A negative result may occur with  improper specimen collection/handling, submission of specimen other than nasopharyngeal swab, presence of viral mutation(s) within the areas targeted by this assay, and inadequate number of viral copies(<138 copies/mL). A negative result must be combined with clinical observations, patient history, and epidemiological information. The expected result is Negative.  Fact  Sheet for Patients:  BloggerCourse.comhttps://www.fda.gov/media/152166/download  Fact Sheet for Healthcare Providers:  SeriousBroker.ithttps://www.fda.gov/media/152162/download  This test is no t yet approved or cleared by the [place] FDA and  has been authorized for detection and/or diagnosis of SARS-CoV-2 by FDA under an Emergency Use Authorization (EUA). This EUA will remain  in effect (meaning this test can be used) for the duration of the COVID-19 declaration under Section 564(b)(1) of the Act, 21 U.S.C.section  360bbb-3(b)(1), unless the authorization is terminated  or revoked sooner.       Influenza A by PCR NEGATIVE NEGATIVE Final   Influenza B by PCR NEGATIVE NEGATIVE Final    Comment: (NOTE) The Xpert Xpress SARS-CoV-2/FLU/RSV plus assay is intended as an aid in the diagnosis of influenza from Nasopharyngeal swab specimens and should not be used as a sole basis for treatment. Nasal washings and aspirates are unacceptable for Xpert Xpress SARS-CoV-2/FLU/RSV testing.  Fact Sheet for Patients: BloggerCourse.com  Fact Sheet for Healthcare Providers: SeriousBroker.it  This test is not yet approved or cleared by the [place] FDA and has been authorized for detection and/or diagnosis of SARS-CoV-2 by FDA under an Emergency Use Authorization (EUA). This EUA will remain in effect (meaning this test can be used) for the duration of the COVID-19 declaration under Section 564(b)(1) of the Act, 21 U.S.C. section 360bbb-3(b)(1), unless the authorization is terminated or revoked.  Performed at [facility], [address], [place], [place] [address]          Radiology Studies: DG Chest Port 1 View  Result Date: 01/29/2020 CLINICAL DATA:  Shortness of breath. EXAM: PORTABLE CHEST 1 VIEW COMPARISON:  Chest x-ray 01/20/2020. FINDINGS: Mediastinum and hilar structures normal. Heart size normal. Low lung volumes with mild bibasilar  atelectasis. Tiny left pleural effusion cannot be excluded. No pneumothorax. Degenerative change thoracic spine. IMPRESSION: Low lung volumes with mild bibasilar atelectasis. Tiny left pleural effusion cannot be excluded. Electronically Signed   By: Maisie Fus  Register   On: 01/29/2020 13:38        Scheduled Meds: . aspirin  81 mg Oral Daily  . carvedilol  6.25 mg Oral BID WC  . enoxaparin (LOVENOX) injection  40 mg Subcutaneous Q24H  . finasteride  5 mg Oral Daily  . ipratropium-albuterol  3 mL Nebulization TID  . ketotifen  1 drop Both Eyes BID  . levothyroxine  25 mcg Oral QAC breakfast  . losartan  50 mg Oral Daily  . memantine  10 mg Oral BID  . polyethylene glycol  17 g Oral Daily  . QUEtiapine  25 mg Oral BID  . rivastigmine  3 mg Oral BID  . simvastatin  20 mg Oral q1800  . sodium chloride flush  3 mL Intravenous Q12H  . tamsulosin  0.4 mg Oral Daily   Continuous Infusions: . sodium chloride       LOS: 11 days   Time spent= 15 mins    Korene Dula Joline Maxcy, MD [facility]  If 7PM-7AM, please contact night-coverage  01/31/2020, 11:11 AM

## 2020-01-31 NOTE — TOC Progression Note (Signed)
Transition of Care [facility]) - Progression Note    Patient Details  Name: Shaun Moore MRN: [id number] Date of Birth: September 10, 1925  [facility]) CM/SW Contact  Carley Hammed, [place] Phone Number: 01/31/2020, 10:07 AM  Clinical Narrative:    [employer] insurance auth approved  11/24 - 11/29  Navi ID# [id number] Virgel Gess following   Expected Discharge Plan: Skilled Nursing Facility Barriers to Discharge: Continued Medical Work up  Expected Discharge Plan and Services Expected Discharge Plan: Skilled Nursing Facility In-house Referral: Clinical Social Work Discharge Planning Services: CM Consult   Living arrangements for the past 2 months: Single Family Home Expected Discharge Date: 01/27/20                                     Social Determinants of Health (SDOH) Interventions    Readmission Risk Interventions No flowsheet data found.

## 2020-01-31 NOTE — Progress Notes (Signed)
Patient pulled IV out. He kept trying to get out of bed. IV team came and got a new one. kerlix is wrapped on IV site right upper arm. Haldol IV Prn given (twice over night). Also, BP has been running high side. Hydralazine prn and labetalol IV prn given overnight. Will continue to assess.

## 2020-02-01 DIAGNOSIS — G309 Alzheimer's disease, unspecified: Secondary | ICD-10-CM | POA: Diagnosis not present

## 2020-02-01 DIAGNOSIS — D72829 Elevated white blood cell count, unspecified: Secondary | ICD-10-CM

## 2020-02-01 DIAGNOSIS — I10 Essential (primary) hypertension: Secondary | ICD-10-CM | POA: Diagnosis not present

## 2020-02-01 DIAGNOSIS — R4182 Altered mental status, unspecified: Secondary | ICD-10-CM | POA: Diagnosis not present

## 2020-02-01 DIAGNOSIS — I2510 Atherosclerotic heart disease of native coronary artery without angina pectoris: Secondary | ICD-10-CM | POA: Diagnosis not present

## 2020-02-01 LAB — URINALYSIS, ROUTINE W REFLEX MICROSCOPIC
Bacteria, UA: NONE SEEN
Bilirubin Urine: NEGATIVE
Glucose, UA: NEGATIVE mg/dL
Hgb urine dipstick: NEGATIVE
Ketones, ur: 5 mg/dL — AB
Leukocytes,Ua: NEGATIVE
Nitrite: NEGATIVE
Protein, ur: 30 mg/dL — AB
Specific Gravity, Urine: 1.019 (ref 1.005–1.030)
pH: 5 (ref 5.0–8.0)

## 2020-02-01 LAB — BASIC METABOLIC PANEL
Anion gap: 11 (ref 5–15)
BUN: 15 mg/dL (ref 8–23)
CO2: 35 mmol/L — ABNORMAL HIGH (ref 22–32)
Calcium: 8.4 mg/dL — ABNORMAL LOW (ref 8.9–10.3)
Chloride: 92 mmol/L — ABNORMAL LOW (ref 98–111)
Creatinine, Ser: 1.35 mg/dL — ABNORMAL HIGH (ref 0.61–1.24)
GFR, Estimated: 49 mL/min — ABNORMAL LOW (ref >60)
Glucose, Bld: 120 mg/dL — ABNORMAL HIGH (ref 70–99)
Potassium: 3.4 mmol/L — ABNORMAL LOW (ref 3.5–5.1)
Sodium: 138 mmol/L (ref 135–145)

## 2020-02-01 LAB — CBC
HCT: 39.5 % (ref 39.0–52.0)
Hemoglobin: 12.6 g/dL — ABNORMAL LOW (ref 13.0–17.0)
MCH: 31 pg (ref 26.0–34.0)
MCHC: 31.9 g/dL (ref 30.0–36.0)
MCV: 97.1 fL (ref 80.0–100.0)
Platelets: 286 10*3/uL (ref 150–400)
RBC: 4.07 MIL/uL — ABNORMAL LOW (ref 4.22–5.81)
RDW: 12.8 % (ref 11.5–15.5)
WBC: 18.7 10*3/uL — ABNORMAL HIGH (ref 4.0–10.5)
nRBC: 0 % (ref 0.0–0.2)

## 2020-02-01 LAB — MAGNESIUM: Magnesium: 1.8 mg/dL (ref 1.7–2.4)

## 2020-02-01 LAB — PROCALCITONIN: Procalcitonin: 0.12 ng/mL

## 2020-02-01 MED ORDER — POTASSIUM CHLORIDE 20 MEQ/15ML (10%) PO SOLN
40.0000 meq | Freq: Every day | ORAL | Status: DC
  Administered 2020-02-01: 40 meq via ORAL
  Filled 2020-02-01 (×2): qty 30

## 2020-02-01 NOTE — Care Management Important Message (Signed)
Important Message  Patient Details  Name: Shaun Moore MRN: [id number] Date of Birth: 04/15/1925   Medicare Important Message Given:  Yes     Dionisia Pacholski P Sulaiman Imbert 02/01/2020, 3:27 PM

## 2020-02-01 NOTE — Progress Notes (Signed)
PROGRESS NOTE    Shaun Moore  LGX:[id number] DOB: [birth year] DOA: 01/20/2020 PCP: Lupita Raider, MD   Brief Narrative:  86 year old with history of Alzheimer, BPH, HTN, GERD, back pain, osteoarthritis who lives with wife son and daughter-in-law has been progressively getting weaker over the course the past week, dehydrated and agitated.  Upon admission he was also delirious in the hospital.  His trauma work-up was negative, no evidence of infection was noted.  PT recommended SNF.  His encephalopathy slowly improved with IV fluid and some rest.  Ongoing arrangements for SNF placement but while in the hospital he started having delirium episode requiring Haldol therefore Seroquel adjusted.   Assessment & Plan:   Principal Problem:   AMS (altered mental status) Active Problems:   Coronary artery disease involving native coronary artery of native heart without angina pectoris   Essential hypertension, benign   Frequent falls   Alzheimer's dementia with behavioral disturbance (HCC)   Rhabdomyolysis   Acute metabolic encephalopathy, delirium episodes -Does have underlying Alzheimer's but his encephalopathy is slowly improving with IV fluids and supportive care.  PT is recommending SNF therefore arrangements being made -Continue his home regimen of Exelon and Namenda.  Increase Seroquel to 25 mg twice daily.  Minimize use of Haldol. -He needs good night rest so he can participate in therapy during the day.  Leukocytosis -Patient is afebrile but given leukocytosis and altered mental status will ensure he does not have any infection, UA and procalcitonin ordered.  Bladder scan.  Low threshold to start antibiotics.  Acute respiratory failure with hypoxia -Resolved.  Likely from atelectasis.  Out of bed to chair.  Aggressive use of incentive spirometer  Hypertension/tachycardia  Coreg 6.25 mg twice daily and losartan. Continue to monitor.  CKD stage IIIb -IV fluids as needed.  Monitor  urine output.  Colonic ileus Diarrhea, resolved -This is improved.  Abdominal x-ray  negative.  Continue bowel regimen as needed  Hypothyroidism -Continue Synthroid  History of BPH -Continue Flomax and Proscar  DVT prophylaxis: Lovenox Code Status: DNR Family Communication: Son Jeannett Senior updated periodically  Status is: Inpatient  Remains inpatient appropriate because:Unsafe d/c plan   Dispo: The patient is from: Home              Anticipated d/c is to: SNF              Anticipated d/c date is: 1-2 days              Patient currently now having leukocytosis requiring frequent Haldol therefore maintain hospital stay prior to discharging to SNF.  Evaluate for any underlying infection  Body mass index is 27.62 kg/m.      Subjective: Some agitation overnight requiring multiple doses of Haldol.  This morning he is resting comfortably.  Remains afebrile.  Examination: Constitutional: Not in acute distress, resting with his eyes closed but easily arousable Respiratory: Minimal bibasilar rhonchi Cardiovascular: Normal sinus rhythm, no rubs Abdomen: Nontender nondistended good bowel sounds Musculoskeletal: No edema noted Skin: No rashes seen Neurologic: Grossly moving all the extremities.  But difficult to perform full thorough neuro exam Psychiatric: Poor judgment and insight.  Alert to name only  Objective: Vitals:   01/31/20 2350 02/01/20 0414 02/01/20 0749 02/01/20 0832  BP: 130/72 126/66 (!) 153/81   Pulse: (!) 107 95 94 94  Resp: 18 16 18 18   Temp: 98 F (36.7 C) (!) 97.5 F (36.4 C) 97.8 F (36.6 C)   TempSrc: Oral Oral Axillary  SpO2: 95% 98% 100% 100%  Weight:        Intake/Output Summary (Last 24 hours) at 02/01/2020 1017 Last data filed at 01/31/2020 1423 Gross per 24 hour  Intake 120 ml  Output 400 ml  Net -280 ml   Filed Weights   01/20/20 2246  Weight: 87.3 kg     Data Reviewed:   CBC: Recent Labs  Lab 01/26/20 0328 01/26/20 0328  01/27/20 0438 01/27/20 0438 01/28/20 0259 01/29/20 0401 01/30/20 0327 01/31/20 0331 02/01/20 0516  WBC 11.6*   < > 13.8*   < > 10.2 11.7* 13.5* 12.3* 18.7*  NEUTROABS 8.5*  --  9.7*  --   --   --   --   --   --   HGB 13.4   < > 13.0   < > 12.3* 12.6* 13.0 14.2 12.6*  HCT 43.1   < > 42.6   < > 40.4 40.9 41.7 42.9 39.5  MCV 99.3   < > 102.9*   < > 102.3* 100.7* 99.3 94.5 97.1  PLT 300   < > 260   < > 267 252 272 274 286   < > = values in this interval not displayed.   Basic Metabolic Panel: Recent Labs  Lab 01/27/20 0438 01/27/20 0438 01/28/20 0259 01/29/20 0401 01/30/20 0327 01/31/20 0331 02/01/20 0516  NA 142   < > 144 141 141 140 138  K 4.1   < > 4.0 3.8 3.1* 4.1 3.4*  CL 107   < > 105 100 98 94* 92*  CO2 24   < > 31 32 34* 32 35*  GLUCOSE 100*   < > 103* 104* 117* 123* 120*  BUN 26*   < > 21 19 15 11 15   CREATININE 1.28*   < > 1.15 1.09 1.06 1.01 1.35*  CALCIUM 8.4*   < > 8.5* 8.7* 8.6* 8.8* 8.4*  MG 2.0   < > 2.1 2.1 1.9 1.9 1.8  PHOS 3.4  --   --   --   --   --   --    < > = values in this interval not displayed.   GFR: Estimated Creatinine Clearance: 36 mL/min (A) (by C-G formula based on SCr of 1.35 mg/dL (H)). Liver Function Tests: No results for input(s): AST, ALT, ALKPHOS, BILITOT, PROT, ALBUMIN in the last 168 hours. No results for input(s): LIPASE, AMYLASE in the last 168 hours. No results for input(s): AMMONIA in the last 168 hours. Coagulation Profile: No results for input(s): INR, PROTIME in the last 168 hours. Cardiac Enzymes: No results for input(s): CKTOTAL, CKMB, CKMBINDEX, TROPONINI in the last 168 hours. BNP (last 3 results) No results for input(s): PROBNP in the last 8760 hours. HbA1C: No results for input(s): HGBA1C in the last 72 hours. CBG: No results for input(s): GLUCAP in the last 168 hours. Lipid Profile: No results for input(s): CHOL, HDL, LDLCALC, TRIG, CHOLHDL, LDLDIRECT in the last 72 hours. Thyroid Function Tests: No results  for input(s): TSH, T4TOTAL, FREET4, T3FREE, THYROIDAB in the last 72 hours. Anemia Panel: No results for input(s): VITAMINB12, FOLATE, FERRITIN, TIBC, IRON, RETICCTPCT in the last 72 hours. Sepsis Labs: No results for input(s): PROCALCITON, LATICACIDVEN in the last 168 hours.  Recent Results (from the past 240 hour(s))  Respiratory Panel by RT PCR (Flu A&B, Covid) - Nasopharyngeal Swab     Status: None   Collection Time: 01/27/20 11:54 AM   Specimen: Nasopharyngeal Swab; Nasopharyngeal(NP) swabs in  vial transport medium  Result Value Ref Range Status   SARS Coronavirus 2 by RT PCR NEGATIVE NEGATIVE Final    Comment: (NOTE) SARS-CoV-2 target nucleic acids are NOT DETECTED.  The SARS-CoV-2 RNA is generally detectable in upper respiratoy specimens during the acute phase of infection. The lowest concentration of SARS-CoV-2 viral copies this assay can detect is 131 copies/mL. A negative result does not preclude SARS-Cov-2 infection and should not be used as the sole basis for treatment or other patient management decisions. A negative result may occur with  improper specimen collection/handling, submission of specimen other than nasopharyngeal swab, presence of viral mutation(s) within the areas targeted by this assay, and inadequate number of viral copies (<131 copies/mL). A negative result must be combined with clinical observations, patient history, and epidemiological information. The expected result is Negative.  Fact Sheet for Patients:  https://www.moore.com/  Fact Sheet for Healthcare Providers:  https://www.young.biz/  This test is no t yet approved or cleared by the [place] FDA and  has been authorized for detection and/or diagnosis of SARS-CoV-2 by FDA under an Emergency Use Authorization (EUA). This EUA will remain  in effect (meaning this test can be used) for the duration of the COVID-19 declaration under Section 564(b)(1) of  the Act, 21 U.S.C. section 360bbb-3(b)(1), unless the authorization is terminated or revoked sooner.     Influenza A by PCR NEGATIVE NEGATIVE Final   Influenza B by PCR NEGATIVE NEGATIVE Final    Comment: (NOTE) The Xpert Xpress SARS-CoV-2/FLU/RSV assay is intended as an aid in  the diagnosis of influenza from Nasopharyngeal swab specimens and  should not be used as a sole basis for treatment. Nasal washings and  aspirates are unacceptable for Xpert Xpress SARS-CoV-2/FLU/RSV  testing.  Fact Sheet for Patients: https://www.moore.com/  Fact Sheet for Healthcare Providers: https://www.young.biz/  This test is not yet approved or cleared by the [place] FDA and  has been authorized for detection and/or diagnosis of SARS-CoV-2 by  FDA under an Emergency Use Authorization (EUA). This EUA will remain  in effect (meaning this test can be used) for the duration of the  Covid-19 declaration under Section 564(b)(1) of the Act, 21  U.S.C. section 360bbb-3(b)(1), unless the authorization is  terminated or revoked. Performed at [facility], [address] [place], [place] [address]   Resp Panel by RT-PCR (Flu A&B, Covid) Nasopharyngeal Swab     Status: None   Collection Time: 01/29/20  4:47 PM   Specimen: Nasopharyngeal Swab; Nasopharyngeal(NP) swabs in vial transport medium  Result Value Ref Range Status   SARS Coronavirus 2 by RT PCR NEGATIVE NEGATIVE Final    Comment: (NOTE) SARS-CoV-2 target nucleic acids are NOT DETECTED.  The SARS-CoV-2 RNA is generally detectable in upper respiratory specimens during the acute phase of infection. The lowest concentration of SARS-CoV-2 viral copies this assay can detect is 138 copies/mL. A negative result does not preclude SARS-Cov-2 infection and should not be used as the sole basis for treatment or other patient management decisions. A negative result may occur with  improper specimen  collection/handling, submission of specimen other than nasopharyngeal swab, presence of viral mutation(s) within the areas targeted by this assay, and inadequate number of viral copies(<138 copies/mL). A negative result must be combined with clinical observations, patient history, and epidemiological information. The expected result is Negative.  Fact Sheet for Patients:  BloggerCourse.com  Fact Sheet for Healthcare Providers:  SeriousBroker.it  This test is no t yet approved or cleared  by the [place] and  has been authorized for detection and/or diagnosis of SARS-CoV-2 by FDA under an Emergency Use Authorization (EUA). This EUA will remain  in effect (meaning this test can be used) for the duration of the COVID-19 declaration under Section 564(b)(1) of the Act, 21 U.S.C.section 360bbb-3(b)(1), unless the authorization is terminated  or revoked sooner.       Influenza A by PCR NEGATIVE NEGATIVE Final   Influenza B by PCR NEGATIVE NEGATIVE Final    Comment: (NOTE) The Xpert Xpress SARS-CoV-2/FLU/RSV plus assay is intended as an aid in the diagnosis of influenza from Nasopharyngeal swab specimens and should not be used as a sole basis for treatment. Nasal washings and aspirates are unacceptable for Xpert Xpress SARS-CoV-2/FLU/RSV testing.  Fact Sheet for Patients: BloggerCourse.com  Fact Sheet for Healthcare Providers: SeriousBroker.it  This test is not yet approved or cleared by the [place] FDA and has been authorized for detection and/or diagnosis of SARS-CoV-2 by FDA under an Emergency Use Authorization (EUA). This EUA will remain in effect (meaning this test can be used) for the duration of the COVID-19 declaration under Section 564(b)(1) of the Act, 21 U.S.C. section 360bbb-3(b)(1), unless the authorization is terminated or revoked.  Performed at [facility], [address], [place], [place] [address]          Radiology Studies: No results found.      Scheduled Meds: . aspirin  81 mg Oral Daily  . carvedilol  6.25 mg Oral BID WC  . enoxaparin (LOVENOX) injection  40 mg Subcutaneous Q24H  . finasteride  5 mg Oral Daily  . ipratropium-albuterol  3 mL Nebulization TID  . ketotifen  1 drop Both Eyes BID  . levothyroxine  25 mcg Oral QAC breakfast  . losartan  50 mg Oral Daily  . memantine  10 mg Oral BID  . polyethylene glycol  17 g Oral Daily  . QUEtiapine  25 mg Oral BID  . rivastigmine  3 mg Oral BID  . simvastatin  20 mg Oral q1800  . sodium chloride flush  3 mL Intravenous Q12H  . tamsulosin  0.4 mg Oral Daily   Continuous Infusions: . sodium chloride       LOS: 12 days   Time spent= 15 mins    Keyarra Rendall Joline Maxcy, MD [facility]  If 7PM-7AM, please contact night-coverage  02/01/2020, 10:17 AM

## 2020-02-01 NOTE — Progress Notes (Signed)
Civil engineer, contracting [facility]) Community Based Palliative Care  This patient has been referred to our palliative care services in the community.   [facility] will continue to follow for any discharge planning needs and to coordinate admission onto palliative care. If you have questions or need assistance, please call [phone] or contact the hospital Liaison listed on AMION.  Roda Shutters RN [facility] Liaison [phone]

## 2020-02-01 NOTE — Plan of Care (Signed)
  Problem: Safety: Goal: Ability to remain free from injury will improve Outcome: Progressing   Problem: Skin Integrity: Goal: Risk for impaired skin integrity will decrease Outcome: Progressing   Problem: Education: Goal: Knowledge of General Education information will improve Description: Including pain rating scale, medication(s)/side effects and non-pharmacologic comfort measures Outcome: Progressing   

## 2020-02-01 NOTE — Progress Notes (Addendum)
Physical Therapy Treatment Patient Details Name: Shaun Moore MRN: [id number] DOB: 11-20-1925 Today's Date: 02/01/2020    History of Present Illness 86 y.o. male who was brought to the ED on 01/20/2020 for multiple falls, altered mental status.  PMH significant for Alzheimer's dementia, BPH, hypertension, GERD, back pain and OA. Patient fell about a week ago and struck his head on the ground but refused to be evaluated at the time.  Over the last 1 week, he has become increasingly agitated, more confused and difficult to reorient.  His gait is unsteady and he has had multiple falls despite the use of RW. Pt fell on 11/17 and was brought to the ED due to LBP.    PT Comments    Pt with eyes closed throughout entire session. Pt minimally verbal but did request to stand and reported he wasn't hungry. Pt requiring Khiree directional verbal and tactile cues to complete transfers, and maxAx2 for std pvt to chair. Pt with posterior bias. Continue to recommend SNF upon d/c to progress mobility as able to decrease burden of care on family. Acute PT to cont to follow.    Follow Up Recommendations  SNF     Equipment Recommendations  Wheelchair (measurements PT);Hospital bed    Recommendations for Other Services       Precautions / Restrictions Precautions Precautions: Fall Precaution Comments: pt in bilat mittens Restrictions Weight Bearing Restrictions: No    Mobility  Bed Mobility Overal bed mobility: Needs Assistance Bed Mobility: Supine to Sit     Supine to sit: Ezeriah assist;+2 for physical assistance     General bed mobility comments: pt with minimal active assist for transfer to EOB, maxA at trunk and to keep LEs off EOB, once seated pt min guard for balance  Transfers Overall transfer level: Needs assistance Equipment used: 2 person hand held assist Transfers: Sit to/from Stand Sit to Stand: Mod assist;+2 physical assistance Stand pivot transfers: Pamela assist;+2 physical  assistance       General transfer comment: pt requested to stand, pt power up well, posterior bias, assist at gait belt to shift weight forward, Dabney tactile cues to weight shift and verbal cues to sequence stepping to chair, Chadd tactile cues at anterior hips to sit, eyes were closed the whole time  Ambulation/Gait                 Stairs             Wheelchair Mobility    Modified Rankin (Stroke Patients Only)       Balance Overall balance assessment: Needs assistance Sitting-balance support: Feet supported;No upper extremity supported Sitting balance-[name] Scale: Fair Sitting balance - Comments: close min guard   Standing balance support: Bilateral upper extremity supported Standing balance-[name] Scale: Poor Standing balance comment: dependent on physical assist                            Cognition Arousal/Alertness: Lethargic Behavior During Therapy: Flat affect Overall Cognitive Status: No family/caregiver present to determine baseline cognitive functioning                                 General Comments: pt maintained eyes open, barely opened to command, pt with no active command follow but did verbalize "help me stand up", pt also stated "no" when asked if he was hungry.  Exercises Other Exercises Other Exercises: passive cervical stretching, rotation and side bending, pt holds head in R side bending    General Comments General comments (skin integrity, edema, etc.): pt with bruising a L hip      Pertinent Vitals/Pain Pain Assessment: Faces Faces Pain Scale: No hurt    Home Living                      Prior Function            PT Goals (current goals can now be found in the care plan section) Progress towards PT goals: Not progressing toward goals - comment    Frequency    Min 2X/week      PT Plan Current plan remains appropriate    Co-evaluation              AM-PAC PT "6 Clicks"  Mobility   Outcome Measure  Help needed turning from your back to your side while in a flat bed without using bedrails?: A Lot Help needed moving from lying on your back to sitting on the side of a flat bed without using bedrails?: A Lot Help needed moving to and from a bed to a chair (including a wheelchair)?: A Lot Help needed standing up from a chair using your arms (e.g., wheelchair or bedside chair)?: A Lot Help needed to walk in hospital room?: A Lot Help needed climbing 3-5 steps with a railing? : Total 6 Click Score: 11    End of Session Equipment Utilized During Treatment: Gait belt;Oxygen Activity Tolerance: Patient limited by lethargy Patient left: in chair;with call bell/phone within reach;with chair alarm set Nurse Communication: Mobility status (use of steady when getting pt back to bed) PT Visit Diagnosis: Unsteadiness on feet (R26.81);Muscle weakness (generalized) (M62.81);Difficulty in walking, not elsewhere classified (R26.2);History of falling (Z91.81)     Time: 9767-3419 PT Time Calculation (min) (ACUTE ONLY): 23 min  Charges:  $Therapeutic Exercise: 8-22 mins $Therapeutic Activity: 8-22 mins                     Lewis Shock, PT, DPT Acute Rehabilitation Services Pager #: [phone] Office #: [phone]    Iona Hansen 02/01/2020, 11:33 AM

## 2020-02-02 ENCOUNTER — Inpatient Hospital Stay (HOSPITAL_COMMUNITY): Payer: Medicare Other

## 2020-02-02 DIAGNOSIS — G309 Alzheimer's disease, unspecified: Secondary | ICD-10-CM | POA: Diagnosis not present

## 2020-02-02 DIAGNOSIS — I2510 Atherosclerotic heart disease of native coronary artery without angina pectoris: Secondary | ICD-10-CM | POA: Diagnosis not present

## 2020-02-02 DIAGNOSIS — I10 Essential (primary) hypertension: Secondary | ICD-10-CM | POA: Diagnosis not present

## 2020-02-02 DIAGNOSIS — R4182 Altered mental status, unspecified: Secondary | ICD-10-CM | POA: Diagnosis not present

## 2020-02-02 LAB — BLOOD GAS, ARTERIAL
Acid-Base Excess: 11.5 mmol/L — ABNORMAL HIGH (ref 0.0–2.0)
Bicarbonate: 35.9 mmol/L — ABNORMAL HIGH (ref 20.0–28.0)
Drawn by: 275531
FIO2: 28
O2 Saturation: 95.2 %
Patient temperature: 37.8
pCO2 arterial: 52.2 mmHg — ABNORMAL HIGH (ref 32.0–48.0)
pH, Arterial: 7.456 — ABNORMAL HIGH (ref 7.350–7.450)
pO2, Arterial: 80.7 mmHg — ABNORMAL LOW (ref 83.0–108.0)

## 2020-02-02 LAB — BASIC METABOLIC PANEL
Anion gap: 13 (ref 5–15)
BUN: 20 mg/dL (ref 8–23)
CO2: 36 mmol/L — ABNORMAL HIGH (ref 22–32)
Calcium: 9 mg/dL (ref 8.9–10.3)
Chloride: 91 mmol/L — ABNORMAL LOW (ref 98–111)
Creatinine, Ser: 1.39 mg/dL — ABNORMAL HIGH (ref 0.61–1.24)
GFR, Estimated: 48 mL/min — ABNORMAL LOW (ref >60)
Glucose, Bld: 114 mg/dL — ABNORMAL HIGH (ref 70–99)
Potassium: 5.2 mmol/L — ABNORMAL HIGH (ref 3.5–5.1)
Sodium: 140 mmol/L (ref 135–145)

## 2020-02-02 LAB — CBC
HCT: 46.6 % (ref 39.0–52.0)
Hemoglobin: 14.6 g/dL (ref 13.0–17.0)
MCH: 30.5 pg (ref 26.0–34.0)
MCHC: 31.3 g/dL (ref 30.0–36.0)
MCV: 97.5 fL (ref 80.0–100.0)
Platelets: 263 10*3/uL (ref 150–400)
RBC: 4.78 MIL/uL (ref 4.22–5.81)
RDW: 12.9 % (ref 11.5–15.5)
WBC: 10.2 10*3/uL (ref 4.0–10.5)
nRBC: 0.2 % (ref 0.0–0.2)

## 2020-02-02 LAB — GLUCOSE, CAPILLARY: Glucose-Capillary: 94 mg/dL (ref 70–99)

## 2020-02-02 MED ORDER — HALOPERIDOL LACTATE 5 MG/ML IJ SOLN: 5.0000 mg | Freq: Four times a day (QID) | INTRAMUSCULAR | Status: AC | PRN

## 2020-02-02 MED ORDER — VANCOMYCIN HCL 1250 MG/250ML IV SOLN
1250.0000 mg | Freq: Once | INTRAVENOUS | Status: AC
  Administered 2020-02-02: 1250 mg via INTRAVENOUS
  Filled 2020-02-02: qty 250

## 2020-02-02 MED ORDER — SODIUM CHLORIDE 0.9 % IV SOLN
1.0000 g | Freq: Two times a day (BID) | INTRAVENOUS | Status: DC
  Administered 2020-02-03 – 2020-02-05 (×5): 1 g via INTRAVENOUS
  Filled 2020-02-02 (×8): qty 1

## 2020-02-02 MED ORDER — ACETAMINOPHEN 325 MG PO TABS
650.0000 mg | ORAL_TABLET | Freq: Four times a day (QID) | ORAL | Status: DC | PRN
  Administered 2020-02-04: 650 mg via ORAL
  Filled 2020-02-02: qty 2

## 2020-02-02 MED ORDER — QUETIAPINE FUMARATE 25 MG PO TABS
25.0000 mg | ORAL_TABLET | Freq: Every day | ORAL | Status: DC
  Administered 2020-02-03 – 2020-02-05 (×3): 25 mg via ORAL
  Filled 2020-02-02 (×3): qty 1

## 2020-02-02 MED ORDER — ACETAMINOPHEN 650 MG RE SUPP: 650.0000 mg | Freq: Four times a day (QID) | RECTAL | Status: DC | PRN

## 2020-02-02 MED ORDER — VANCOMYCIN HCL IN DEXTROSE 1-5 GM/200ML-% IV SOLN
1000.0000 mg | INTRAVENOUS | Status: DC
  Administered 2020-02-03 – 2020-02-04 (×2): 1000 mg via INTRAVENOUS
  Filled 2020-02-02 (×2): qty 200

## 2020-02-02 MED ORDER — SODIUM CHLORIDE 0.9 % IV SOLN
2.0000 g | Freq: Once | INTRAVENOUS | Status: AC
  Administered 2020-02-02: 2 g via INTRAVENOUS
  Filled 2020-02-02: qty 2

## 2020-02-02 MED ORDER — QUETIAPINE FUMARATE 50 MG PO TABS
50.0000 mg | ORAL_TABLET | Freq: Every day | ORAL | Status: DC
  Administered 2020-02-03 – 2020-02-04 (×2): 50 mg via ORAL
  Filled 2020-02-02 (×2): qty 1

## 2020-02-02 NOTE — Plan of Care (Signed)
  Problem: Safety: Goal: Ability to remain free from injury will improve Outcome: Progressing   

## 2020-02-02 NOTE — Progress Notes (Addendum)
PROGRESS NOTE    Shaun Moore  XFG:[id number] DOB: 1925/03/15 DOA: 01/20/2020 PCP: Lupita Raider, MD   Brief Narrative:  86 year old with history of Alzheimer, BPH, HTN, GERD, back pain, osteoarthritis who lives with wife son and daughter-in-law has been progressively getting weaker over the course the past week, dehydrated and agitated.  Upon admission he was also delirious in the hospital.  His trauma work-up was negative, no evidence of infection was noted.  PT recommended SNF.  His encephalopathy slowly improved with IV fluid and some rest.  Ongoing arrangements for SNF placement but while in the hospital he started having delirium episode requiring Haldol therefore Seroquel adjusted.  While awaiting bed developed fever and altered mental status therefore stroke work-up for any underlying infection.   Assessment & Plan:   Principal Problem:   AMS (altered mental status) Active Problems:   Coronary artery disease involving native coronary artery of native heart without angina pectoris   Essential hypertension, benign   Frequent falls   Alzheimer's dementia with behavioral disturbance (HCC)   Rhabdomyolysis  Fever and altered mental status -No obvious source of infection, empirically started patient on vancomycin and cefepime.  Procalcitonin and UA negative.  Tylenol for fever  Acute metabolic encephalopathy, delirium episodes -Does have underlying Alzheimer's but his encephalopathy is slowly improving with IV fluids and supportive care.  PT is recommending SNF therefore arrangements being made -Continue his home regimen of Exelon and Namenda.  Minimize alcohol use.  Continue Seroquel 25 mg daily but will increase bedtime dose to 50 mg. -He needs good night rest so he can participate in therapy during the day. Repeat CT head 11/30= stable  Acute respiratory failure with hypoxia -Resolved.  Likely from atelectasis.  Out of bed to chair.  Aggressive use of incentive  spirometer  Hypertension/tachycardia  Coreg 6.25 mg twice daily and losartan. Continue to monitor.  CKD stage IIIb -IV fluids as needed.  Monitor urine output.  Colonic ileus Diarrhea, resolved -Abdominal x-ray  negative.  Continue bowel regimen as needed  Hypothyroidism -Continue Synthroid  History of BPH -Continue Flomax and Proscar  DVT prophylaxis: Lovenox Code Status: DNR Family Communication: Son Updated by me today  Status is: Inpatient  Remains inpatient appropriate because:Unsafe d/c plan   Dispo: The patient is from: Home              Anticipated d/c is to: SNF              Anticipated d/c date is: 1-2 days              Patient currently   Body mass index is 27.62 kg/m.      Subjective: In the morning when I saw the patient he was resting comfortably but 1 7 L nasal cannula.  Apparently he had desaturated down to 70% on room air therefore respiratory therapist placed him on 7 L nasal cannula.  Soon after my visit patient became febrile and tachycardic therefore rapid response was called and I came back to see him at bedside.  Patient did not appear to be in acute distress but had fever of 102 for which Tylenol was ordered.  He was responding to very basic questions.  Bibasilar crackles were noted.  After getting Tylenol his fevers improved along with his tachycardia. I have also updated his son regarding these findings.  Examination: Constitutional: febrile warm to touch Respiratory: bibasilar crackles.  Cardiovascular: Sinus tachycardia Abdomen: Nontender nondistended good bowel sounds Musculoskeletal: No edema noted  Skin: No rashes seen Neurologic: CN 2-12 grossly intact.  And nonfocal Psychiatric: Normal judgment and insight. Alert and oriented x 3. Normal mood.    Objective: Vitals:   02/01/20 1542 02/01/20 1937 02/02/20 0001 02/02/20 0416  BP: 127/80 107/77 (!) 157/76 (!) 114/97  Pulse: 88 93 91 100  Resp: 18 18 18 20   Temp: 97.8 F (36.6  C) 98 F (36.7 C) 98 F (36.7 C) 98 F (36.7 C)  TempSrc: Axillary Oral Axillary Axillary  SpO2: 99% 100% 93% 94%  Weight:        Intake/Output Summary (Last 24 hours) at 02/02/2020 0823 Last data filed at 02/02/2020 02/04/2020 Gross per 24 hour  Intake 240 ml  Output 400 ml  Net -160 ml   Filed Weights   01/20/20 2246  Weight: 87.3 kg     Data Reviewed:   CBC: Recent Labs  Lab 01/27/20 0438 01/28/20 0259 01/29/20 0401 01/30/20 0327 01/31/20 0331 02/01/20 0516 02/02/20 0630  WBC 13.8*   < > 11.7* 13.5* 12.3* 18.7* 10.2  NEUTROABS 9.7*  --   --   --   --   --   --   HGB 13.0   < > 12.6* 13.0 14.2 12.6* 14.6  HCT 42.6   < > 40.9 41.7 42.9 39.5 46.6  MCV 102.9*   < > 100.7* 99.3 94.5 97.1 97.5  PLT 260   < > 252 272 274 286 263   < > = values in this interval not displayed.   Basic Metabolic Panel: Recent Labs  Lab 01/27/20 0438 01/27/20 0438 01/28/20 0259 01/28/20 0259 01/29/20 0401 01/30/20 0327 01/31/20 0331 02/01/20 0516 02/02/20 0630  NA 142   < > 144   < > 141 141 140 138 140  K 4.1   < > 4.0   < > 3.8 3.1* 4.1 3.4* 5.2*  CL 107   < > 105   < > 100 98 94* 92* 91*  CO2 24   < > 31   < > 32 34* 32 35* 36*  GLUCOSE 100*   < > 103*   < > 104* 117* 123* 120* 114*  BUN 26*   < > 21   < > 19 15 11 15 20   CREATININE 1.28*   < > 1.15   < > 1.09 1.06 1.01 1.35* 1.39*  CALCIUM 8.4*   < > 8.5*   < > 8.7* 8.6* 8.8* 8.4* 9.0  MG 2.0   < > 2.1  --  2.1 1.9 1.9 1.8  --   PHOS 3.4  --   --   --   --   --   --   --   --    < > = values in this interval not displayed.   GFR: Estimated Creatinine Clearance: 35 mL/min (A) (by C-G formula based on SCr of 1.39 mg/dL (H)). Liver Function Tests: No results for input(s): AST, ALT, ALKPHOS, BILITOT, PROT, ALBUMIN in the last 168 hours. No results for input(s): LIPASE, AMYLASE in the last 168 hours. No results for input(s): AMMONIA in the last 168 hours. Coagulation Profile: No results for input(s): INR, PROTIME in the last  168 hours. Cardiac Enzymes: No results for input(s): CKTOTAL, CKMB, CKMBINDEX, TROPONINI in the last 168 hours. BNP (last 3 results) No results for input(s): PROBNP in the last 8760 hours. HbA1C: No results for input(s): HGBA1C in the last 72 hours. CBG: No results for input(s): GLUCAP in the last  168 hours. Lipid Profile: No results for input(s): CHOL, HDL, LDLCALC, TRIG, CHOLHDL, LDLDIRECT in the last 72 hours. Thyroid Function Tests: No results for input(s): TSH, T4TOTAL, FREET4, T3FREE, THYROIDAB in the last 72 hours. Anemia Panel: No results for input(s): VITAMINB12, FOLATE, FERRITIN, TIBC, IRON, RETICCTPCT in the last 72 hours. Sepsis Labs: Recent Labs  Lab 02/01/20 0850  PROCALCITON 0.12    Recent Results (from the past 240 hour(s))  Respiratory Panel by RT PCR (Flu A&B, Covid) - Nasopharyngeal Swab     Status: None   Collection Time: 01/27/20 11:54 AM   Specimen: Nasopharyngeal Swab; Nasopharyngeal(NP) swabs in vial transport medium  Result Value Ref Range Status   SARS Coronavirus 2 by RT PCR NEGATIVE NEGATIVE Final    Comment: (NOTE) SARS-CoV-2 target nucleic acids are NOT DETECTED.  The SARS-CoV-2 RNA is generally detectable in upper respiratoy specimens during the acute phase of infection. The lowest concentration of SARS-CoV-2 viral copies this assay can detect is 131 copies/mL. A negative result does not preclude SARS-Cov-2 infection and should not be used as the sole basis for treatment or other patient management decisions. A negative result may occur with  improper specimen collection/handling, submission of specimen other than nasopharyngeal swab, presence of viral mutation(s) within the areas targeted by this assay, and inadequate number of viral copies (<131 copies/mL). A negative result must be combined with clinical observations, patient history, and epidemiological information. The expected result is Negative.  Fact Sheet for Patients:   https://www.moore.com/  Fact Sheet for Healthcare Providers:  https://www.young.biz/  This test is no t yet approved or cleared by the [place] FDA and  has been authorized for detection and/or diagnosis of SARS-CoV-2 by FDA under an Emergency Use Authorization (EUA). This EUA will remain  in effect (meaning this test can be used) for the duration of the COVID-19 declaration under Section 564(b)(1) of the Act, 21 U.S.C. section 360bbb-3(b)(1), unless the authorization is terminated or revoked sooner.     Influenza A by PCR NEGATIVE NEGATIVE Final   Influenza B by PCR NEGATIVE NEGATIVE Final    Comment: (NOTE) The Xpert Xpress SARS-CoV-2/FLU/RSV assay is intended as an aid in  the diagnosis of influenza from Nasopharyngeal swab specimens and  should not be used as a sole basis for treatment. Nasal washings and  aspirates are unacceptable for Xpert Xpress SARS-CoV-2/FLU/RSV  testing.  Fact Sheet for Patients: https://www.moore.com/  Fact Sheet for Healthcare Providers: https://www.young.biz/  This test is not yet approved or cleared by the [place] FDA and  has been authorized for detection and/or diagnosis of SARS-CoV-2 by  FDA under an Emergency Use Authorization (EUA). This EUA will remain  in effect (meaning this test can be used) for the duration of the  Covid-19 declaration under Section 564(b)(1) of the Act, 21  U.S.C. section 360bbb-3(b)(1), unless the authorization is  terminated or revoked. Performed at [facility], [address], [place], [place] [address]   Resp Panel by RT-PCR (Flu A&B, Covid) Nasopharyngeal Swab     Status: None   Collection Time: 01/29/20  4:47 PM   Specimen: Nasopharyngeal Swab; Nasopharyngeal(NP) swabs in vial transport medium  Result Value Ref Range Status   SARS Coronavirus 2 by RT PCR NEGATIVE NEGATIVE Final    Comment: (NOTE) SARS-CoV-2  target nucleic acids are NOT DETECTED.  The SARS-CoV-2 RNA is generally detectable in upper respiratory specimens during the acute phase of infection. The lowest concentration of SARS-CoV-2 viral copies this assay can  detect is 138 copies/mL. A negative result does not preclude SARS-Cov-2 infection and should not be used as the sole basis for treatment or other patient management decisions. A negative result may occur with  improper specimen collection/handling, submission of specimen other than nasopharyngeal swab, presence of viral mutation(s) within the areas targeted by this assay, and inadequate number of viral copies(<138 copies/mL). A negative result must be combined with clinical observations, patient history, and epidemiological information. The expected result is Negative.  Fact Sheet for Patients:  BloggerCourse.comhttps://www.fda.gov/media/152166/download  Fact Sheet for Healthcare Providers:  SeriousBroker.ithttps://www.fda.gov/media/152162/download  This test is no t yet approved or cleared by the [place] FDA and  has been authorized for detection and/or diagnosis of SARS-CoV-2 by FDA under an Emergency Use Authorization (EUA). This EUA will remain  in effect (meaning this test can be used) for the duration of the COVID-19 declaration under Section 564(b)(1) of the Act, 21 U.S.C.section 360bbb-3(b)(1), unless the authorization is terminated  or revoked sooner.       Influenza A by PCR NEGATIVE NEGATIVE Final   Influenza B by PCR NEGATIVE NEGATIVE Final    Comment: (NOTE) The Xpert Xpress SARS-CoV-2/FLU/RSV plus assay is intended as an aid in the diagnosis of influenza from Nasopharyngeal swab specimens and should not be used as a sole basis for treatment. Nasal washings and aspirates are unacceptable for Xpert Xpress SARS-CoV-2/FLU/RSV testing.  Fact Sheet for Patients: BloggerCourse.comhttps://www.fda.gov/media/152166/download  Fact Sheet for Healthcare  Providers: SeriousBroker.ithttps://www.fda.gov/media/152162/download  This test is not yet approved or cleared by the [place] FDA and has been authorized for detection and/or diagnosis of SARS-CoV-2 by FDA under an Emergency Use Authorization (EUA). This EUA will remain in effect (meaning this test can be used) for the duration of the COVID-19 declaration under Section 564(b)(1) of the Act, 21 U.S.C. section 360bbb-3(b)(1), unless the authorization is terminated or revoked.  Performed at [facility], [address] [place], [place] [address]          Radiology Studies: No results found.      Scheduled Meds: . aspirin  81 mg Oral Daily  . carvedilol  6.25 mg Oral BID WC  . enoxaparin (LOVENOX) injection  40 mg Subcutaneous Q24H  . finasteride  5 mg Oral Daily  . ipratropium-albuterol  3 mL Nebulization TID  . ketotifen  1 drop Both Eyes BID  . levothyroxine  25 mcg Oral QAC breakfast  . losartan  50 mg Oral Daily  . memantine  10 mg Oral BID  . polyethylene glycol  17 g Oral Daily  . potassium chloride  40 mEq Oral Daily  . QUEtiapine  25 mg Oral Daily   And  . QUEtiapine  50 mg Oral QHS  . rivastigmine  3 mg Oral BID  . simvastatin  20 mg Oral q1800  . sodium chloride flush  3 mL Intravenous Q12H  . tamsulosin  0.4 mg Oral Daily   Continuous Infusions: . sodium chloride       LOS: 13 days   Time spent= 15 mins    Tashona Calk Joline Maxcyhirag Phung Kotas, MD [facility]  If 7PM-7AM, please contact night-coverage  02/02/2020, 8:23 AM

## 2020-02-02 NOTE — Progress Notes (Signed)
OT Cancellation Note  Patient Details Name: Shaun Moore MRN: [id number] DOB: 10-Mar-1925   Cancelled Treatment:    Reason Eval/Treat Not Completed: Medical issues which prohibited therapy;Fatigue/lethargy limiting ability to participate;Other (comment) Noted rapid response event earlier in day- pt very lethargic upon arrival, unable to arouse pt for OT session, will check back as time allows for OT session.   Audery Amel., COTA/L Acute Rehabilitation Services [phone]   Angelina Pih 02/02/2020, 1:18 PM

## 2020-02-02 NOTE — TOC Progression Note (Signed)
Transition of Care [facility]) - Progression Note    Patient Details  Name: Shaun Moore MRN: [id number] Date of Birth: 03-08-1925  [facility] CM/SW Contact  Mearl Latin, LCSW Phone Number: 02/02/2020, 3:22 PM  Clinical Narrative:    CSW sent clinicals to [facility] for a new SNF authorization.   Expected Discharge Plan: Skilled Nursing Facility Barriers to Discharge: SNF Pending transportation, Barriers Resolved  Expected Discharge Plan and Services Expected Discharge Plan: Skilled Nursing Facility In-house Referral: Clinical Social Work Discharge Planning Services: CM Consult   Living arrangements for the past 2 months: Single Family Home Expected Discharge Date: 01/27/20                                     Social Determinants of Health (SDOH) Interventions    Readmission Risk Interventions No flowsheet data found.

## 2020-02-02 NOTE — Significant Event (Signed)
Rapid Response Event Note   Reason for Call :  Lethargy  Initial Focused Assessment:  Pt lying in bed. Responds to pain. Gag reflex and cough intact. Lung sounds are clear in the upper lobes, crackles in the bases. Skin is hot to touch. Pt moving all extremities.   VS: T 102.12F rectal, BP 15/55, HR 112, RR 15, SpO2 98% on 6LNC CBG: 94  Interventions:  -ABG -PRN Tylenol for fever -CXR  -Blood cultures -Cardiac monitoring  Plan of Care:  -Treat fever. Reevaluate mentation following fever reduction -Frequent neuro checks -Cardiac monitoring and continuous pulse oximetry monitoring -Aspiration precautions -Follow-up with provider results of tests ordered  Call rapid response for additional needs.  Event Summary:  MD Notified: Dr. Nelson Chimes Call Time: [phone] Arrival Time: 0933 End Time: 1000   Jennye Moccasin, RN

## 2020-02-02 NOTE — Progress Notes (Signed)
Pharmacy Antibiotic Note  Shaun Moore is a 86 y.o. male admitted on 01/20/2020 with concern for sepsis in the setting of fevers. Pharmacy has been consulted for Vancomycin + Cefepime dosing.  The patient is noted to have underlying CKD - SCr 1.39, CrCl~30 ml/min.   Plan: - Vancomycin 1250 mg IV x 1 followed by 1g IV every 24 hours - Cefepime 2g IV x 1 followed by 1g IV every 12 hours - Will continue to follow renal function, culture results, LOT, and antibiotic de-escalation plans   Weight: 87.3 kg (192 lb 7.4 oz)  Temp (24hrs), Avg:99.1 F (37.3 C), Min:97.8 F (36.6 C), Krystal:102.6 F (39.2 C)  Recent Labs  Lab 01/29/20 0401 01/30/20 0327 01/31/20 0331 02/01/20 0516 02/02/20 0630  WBC 11.7* 13.5* 12.3* 18.7* 10.2  CREATININE 1.09 1.06 1.01 1.35* 1.39*    Estimated Creatinine Clearance: 35 mL/min (A) (by C-G formula based on SCr of 1.39 mg/dL (H)).    Allergies  Allergen Reactions  . Lisinopril Cough  . Ambien [Zolpidem Tartrate]     Stated that is "made him crazy"    Antimicrobials this admission: Vanc 11/30 >> Cefepime 11/30  Microbiology results: 11/26 Fluvid >> neg 11/30 BCx >>  Thank you for allowing pharmacy to be a part of this patient's care.  Georgina Pillion, PharmD, BCPS Clinical Pharmacist Clinical phone for 02/02/2020: [phone] 02/02/2020 10:55 AM   **Pharmacist phone directory can now be found on [email or web address] ([email or web address]).  Listed under [facility].

## 2020-02-03 LAB — BASIC METABOLIC PANEL
Anion gap: 13 (ref 5–15)
BUN: 32 mg/dL — ABNORMAL HIGH (ref 8–23)
CO2: 33 mmol/L — ABNORMAL HIGH (ref 22–32)
Calcium: 8.3 mg/dL — ABNORMAL LOW (ref 8.9–10.3)
Chloride: 94 mmol/L — ABNORMAL LOW (ref 98–111)
Creatinine, Ser: 1.67 mg/dL — ABNORMAL HIGH (ref 0.61–1.24)
GFR, Estimated: 38 mL/min — ABNORMAL LOW (ref >60)
Glucose, Bld: 119 mg/dL — ABNORMAL HIGH (ref 70–99)
Potassium: 3.5 mmol/L (ref 3.5–5.1)
Sodium: 140 mmol/L (ref 135–145)

## 2020-02-03 LAB — CBC
HCT: 38.1 % — ABNORMAL LOW (ref 39.0–52.0)
Hemoglobin: 12.2 g/dL — ABNORMAL LOW (ref 13.0–17.0)
MCH: 31.1 pg (ref 26.0–34.0)
MCHC: 32 g/dL (ref 30.0–36.0)
MCV: 97.2 fL (ref 80.0–100.0)
Platelets: 221 10*3/uL (ref 150–400)
RBC: 3.92 MIL/uL — ABNORMAL LOW (ref 4.22–5.81)
RDW: 13.8 % (ref 11.5–15.5)
WBC: 40.8 10*3/uL — ABNORMAL HIGH (ref 4.0–10.5)
nRBC: 0 % (ref 0.0–0.2)

## 2020-02-03 MED ORDER — ENOXAPARIN SODIUM 30 MG/0.3ML ~~LOC~~ SOLN
30.0000 mg | SUBCUTANEOUS | Status: DC
  Administered 2020-02-03 – 2020-02-04 (×2): 30 mg via SUBCUTANEOUS
  Filled 2020-02-03 (×2): qty 0.3

## 2020-02-03 MED ORDER — IPRATROPIUM-ALBUTEROL 0.5-2.5 (3) MG/3ML IN SOLN
3.0000 mL | Freq: Two times a day (BID) | RESPIRATORY_TRACT | Status: DC
  Administered 2020-02-03 – 2020-02-04 (×3): 3 mL via RESPIRATORY_TRACT
  Filled 2020-02-03 (×4): qty 3

## 2020-02-03 MED ORDER — SODIUM CHLORIDE 0.9 % IV SOLN: INTRAVENOUS | Status: DC

## 2020-02-03 NOTE — Progress Notes (Signed)
Patient has not urinated the whole night. Bladder scan read 393 mL. 20 beats of VT this shift. Notified Dr. Archer Asa . See new orders

## 2020-02-03 NOTE — Progress Notes (Signed)
PROGRESS NOTE    Shaun Moore  WFU:[id number] DOB: 10-Jul-1925 DOA: 01/20/2020 PCP: Lupita Raider, MD    Chief Complaint  Patient presents with  . Altered Mental Status  . Fall    Brief Narrative:   86 year old gentleman with history of Alzheimer's dementia, hypertension, BPH, GERD, osteoarthritis who lives with his wife, son, daughter-in-law has been progressively getting a weaker over the past 1 week.  On arrival to ED patient was delirious so far no evidence of infection was noted therapy evaluation recommended SNF.  His altered mental status and encephalopathy improved initially with some IV fluids.  While waiting for bed placement patient became febrile.  Blood cultures have been negative so far.  CT of the head without contrast is negative.  Chest x-ray does not show any opacity. Labs revealed worsening of creatinine from 1.3-1.6 in the last 24 hours. Assessment & Plan:   Principal Problem:   AMS (altered mental status) Active Problems:   Coronary artery disease involving native coronary artery of native heart without angina pectoris   Essential hypertension, benign   Frequent falls   Alzheimer's dementia with behavioral disturbance (HCC)   Rhabdomyolysis   Mild AKI probably secondary to dehydration and urinary retention.  Recommend IV fluids for 24 hours and follow creatinine in the morning.    Mild acute encephalopathy Patient alert this morning and is able to eat breakfast sitting up in the bed with assistance.  He is alert but not oriented.    Essential hypertension blood pressure parameters are optimal at this time.   Advanced Alzheimer's dementia Resume home medications at this time.   Fever Unclear etiology Chest x-ray is negative, will repeat blood cultures have been negative so far Urine analysis is still pending. Patient was empirically started on vancomycin and cefepime recommend continuing the same, continue IV fluids.   In view of his poor  progression recommend palliative care consult for goals of care discussion at this time.   DVT prophylaxis: (Lovenox) Code Status: DNR Family Communication: None at bedside Disposition:   Status is: Inpatient  Remains inpatient appropriate because:Ongoing diagnostic testing needed not appropriate for outpatient work up, Unsafe d/c plan and IV treatments appropriate due to intensity of illness or inability to take PO   Dispo: The patient is from: Home              Anticipated d/c is to: SNF              Anticipated d/c date is: 2 days              Patient currently is not medically stable to d/c.       Consultants:   Palliative care  Procedures: None Antimicrobials  Antibiotics Given (last 72 hours)    Date/Time Action Medication Dose Rate   02/02/20 1138 New Bag/Given   ceFEPIme (MAXIPIME) 2 g in sodium chloride 0.9 % 100 mL IVPB 2 g 200 mL/hr   02/02/20 1228 New Bag/Given   vancomycin (VANCOREADY) IVPB 1250 mg/250 mL 1,250 mg 166.7 mL/hr   02/03/20 0000 New Bag/Given   ceFEPIme (MAXIPIME) 1 g in sodium chloride 0.9 % 100 mL IVPB 1 g 200 mL/hr   02/03/20 1000 New Bag/Given   ceFEPIme (MAXIPIME) 1 g in sodium chloride 0.9 % 100 mL IVPB 1 g 200 mL/hr   02/03/20 1357 New Bag/Given   vancomycin (VANCOCIN) IVPB 1000 mg/200 mL premix 1,000 mg 200 mL/hr       Subjective: Alert and  following simple commands this am.  Confused.   Objective: Vitals:   02/03/20 0425 02/03/20 0836 02/03/20 0920 02/03/20 1117  BP: (!) 112/58  (!) 130/91 (!) 96/38  Pulse: 83  95 91  Resp: 17  18 18   Temp: 97.9 F (36.6 C)  97.8 F (36.6 C) 97.9 F (36.6 C)  TempSrc: Axillary  Oral Oral  SpO2: 100% 98% 97% 98%  Weight:        Intake/Output Summary (Last 24 hours) at 02/03/2020 1406 Last data filed at 02/03/2020 0530 Gross per 24 hour  Intake 100 ml  Output 179 ml  Net -79 ml   Filed Weights   01/20/20 2246  Weight: 87.3 kg    Examination:  General exam: Appears calm and  comfortable  Respiratory system: Clear to auscultation. Respiratory effort normal. Cardiovascular system: S1 & S2 heard, RRR. No JVD, No pedal edema. Gastrointestinal system: Abdomen is nondistended, soft and nontender.Normal bowel sounds heard. Central nervous system: Alert , following simple commands, but confused, .  Extremities: no pedal edema.  Skin: No rashes seen.  Psychiatry: . Mood is appropriate. Not agitated.     Data Reviewed: I have personally reviewed following labs and imaging studies  CBC: Recent Labs  Lab 01/30/20 0327 01/31/20 0331 02/01/20 0516 02/02/20 0630 02/03/20 0438  WBC 13.5* 12.3* 18.7* 10.2 40.8*  HGB 13.0 14.2 12.6* 14.6 12.2*  HCT 41.7 42.9 39.5 46.6 38.1*  MCV 99.3 94.5 97.1 97.5 97.2  PLT 272 274 286 263 221    Basic Metabolic Panel: Recent Labs  Lab 01/28/20 0259 01/28/20 0259 01/29/20 0401 01/29/20 0401 01/30/20 0327 01/31/20 0331 02/01/20 0516 02/02/20 0630 02/03/20 0438  NA 144   < > 141   < > 141 140 138 140 140  K 4.0   < > 3.8   < > 3.1* 4.1 3.4* 5.2* 3.5  CL 105   < > 100   < > 98 94* 92* 91* 94*  CO2 31   < > 32   < > 34* 32 35* 36* 33*  GLUCOSE 103*   < > 104*   < > 117* 123* 120* 114* 119*  BUN 21   < > 19   < > 15 11 15 20  32*  CREATININE 1.15   < > 1.09   < > 1.06 1.01 1.35* 1.39* 1.67*  CALCIUM 8.5*   < > 8.7*   < > 8.6* 8.8* 8.4* 9.0 8.3*  MG 2.1  --  2.1  --  1.9 1.9 1.8  --   --    < > = values in this interval not displayed.    GFR: Estimated Creatinine Clearance: 29.1 mL/min (A) (by C-G formula based on SCr of 1.67 mg/dL (H)).  Liver Function Tests: No results for input(s): AST, ALT, ALKPHOS, BILITOT, PROT, ALBUMIN in the last 168 hours.  CBG: Recent Labs  Lab 02/02/20 0933  GLUCAP 94     Recent Results (from the past 240 hour(s))  Respiratory Panel by RT PCR (Flu A&B, Covid) - Nasopharyngeal Swab     Status: None   Collection Time: 01/27/20 11:54 AM   Specimen: Nasopharyngeal Swab;  Nasopharyngeal(NP) swabs in vial transport medium  Result Value Ref Range Status   SARS Coronavirus 2 by RT PCR NEGATIVE NEGATIVE Final    Comment: (NOTE) SARS-CoV-2 target nucleic acids are NOT DETECTED.  The SARS-CoV-2 RNA is generally detectable in upper respiratoy specimens during the acute phase of infection. The lowest concentration  of SARS-CoV-2 viral copies this assay can detect is 131 copies/mL. A negative result does not preclude SARS-Cov-2 infection and should not be used as the sole basis for treatment or other patient management decisions. A negative result may occur with  improper specimen collection/handling, submission of specimen other than nasopharyngeal swab, presence of viral mutation(s) within the areas targeted by this assay, and inadequate number of viral copies (<131 copies/mL). A negative result must be combined with clinical observations, patient history, and epidemiological information. The expected result is Negative.  Fact Sheet for Patients:  https://www.moore.com/  Fact Sheet for Healthcare Providers:  https://www.young.biz/  This test is no t yet approved or cleared by the [place] FDA and  has been authorized for detection and/or diagnosis of SARS-CoV-2 by FDA under an Emergency Use Authorization (EUA). This EUA will remain  in effect (meaning this test can be used) for the duration of the COVID-19 declaration under Section 564(b)(1) of the Act, 21 U.S.C. section 360bbb-3(b)(1), unless the authorization is terminated or revoked sooner.     Influenza A by PCR NEGATIVE NEGATIVE Final   Influenza B by PCR NEGATIVE NEGATIVE Final    Comment: (NOTE) The Xpert Xpress SARS-CoV-2/FLU/RSV assay is intended as an aid in  the diagnosis of influenza from Nasopharyngeal swab specimens and  should not be used as a sole basis for treatment. Nasal washings and  aspirates are unacceptable for Xpert Xpress  SARS-CoV-2/FLU/RSV  testing.  Fact Sheet for Patients: https://www.moore.com/  Fact Sheet for Healthcare Providers: https://www.young.biz/  This test is not yet approved or cleared by the [place] FDA and  has been authorized for detection and/or diagnosis of SARS-CoV-2 by  FDA under an Emergency Use Authorization (EUA). This EUA will remain  in effect (meaning this test can be used) for the duration of the  Covid-19 declaration under Section 564(b)(1) of the Act, 21  U.S.C. section 360bbb-3(b)(1), unless the authorization is  terminated or revoked. Performed at [facility], [address], [place], [place] [address]   Resp Panel by RT-PCR (Flu A&B, Covid) Nasopharyngeal Swab     Status: None   Collection Time: 01/29/20  4:47 PM   Specimen: Nasopharyngeal Swab; Nasopharyngeal(NP) swabs in vial transport medium  Result Value Ref Range Status   SARS Coronavirus 2 by RT PCR NEGATIVE NEGATIVE Final    Comment: (NOTE) SARS-CoV-2 target nucleic acids are NOT DETECTED.  The SARS-CoV-2 RNA is generally detectable in upper respiratory specimens during the acute phase of infection. The lowest concentration of SARS-CoV-2 viral copies this assay can detect is 138 copies/mL. A negative result does not preclude SARS-Cov-2 infection and should not be used as the sole basis for treatment or other patient management decisions. A negative result may occur with  improper specimen collection/handling, submission of specimen other than nasopharyngeal swab, presence of viral mutation(s) within the areas targeted by this assay, and inadequate number of viral copies(<138 copies/mL). A negative result must be combined with clinical observations, patient history, and epidemiological information. The expected result is Negative.  Fact Sheet for Patients:  BloggerCourse.com  Fact Sheet for Healthcare Providers:    SeriousBroker.it  This test is no t yet approved or cleared by the [place] FDA and  has been authorized for detection and/or diagnosis of SARS-CoV-2 by FDA under an Emergency Use Authorization (EUA). This EUA will remain  in effect (meaning this test can be used) for the duration of the COVID-19 declaration under Section 564(b)(1) of the Act, 21  U.S.C.section 360bbb-3(b)(1), unless the authorization is terminated  or revoked sooner.       Influenza A by PCR NEGATIVE NEGATIVE Final   Influenza B by PCR NEGATIVE NEGATIVE Final    Comment: (NOTE) The Xpert Xpress SARS-CoV-2/FLU/RSV plus assay is intended as an aid in the diagnosis of influenza from Nasopharyngeal swab specimens and should not be used as a sole basis for treatment. Nasal washings and aspirates are unacceptable for Xpert Xpress SARS-CoV-2/FLU/RSV testing.  Fact Sheet for Patients: BloggerCourse.com  Fact Sheet for Healthcare Providers: SeriousBroker.it  This test is not yet approved or cleared by the [place] FDA and has been authorized for detection and/or diagnosis of SARS-CoV-2 by FDA under an Emergency Use Authorization (EUA). This EUA will remain in effect (meaning this test can be used) for the duration of the COVID-19 declaration under Section 564(b)(1) of the Act, 21 U.S.C. section 360bbb-3(b)(1), unless the authorization is terminated or revoked.  Performed at [facility], [address] [place], [place] [address]   Culture, blood (routine x 2)     Status: None (Preliminary result)   Collection Time: 02/02/20 11:41 AM   Specimen: BLOOD RIGHT ARM  Result Value Ref Range Status   Specimen Description BLOOD RIGHT ARM  Final   Special Requests   Final    BOTTLES DRAWN AEROBIC AND ANAEROBIC Blood Culture adequate volume   Culture   Final    NO GROWTH < 24 HOURS Performed at [facility], [address] [place], [place] [address]    Report Status PENDING  Incomplete  Culture, blood (routine x 2)     Status: None (Preliminary result)   Collection Time: 02/02/20 11:42 AM   Specimen: BLOOD RIGHT HAND  Result Value Ref Range Status   Specimen Description BLOOD RIGHT HAND  Final   Special Requests   Final    BOTTLES DRAWN AEROBIC AND ANAEROBIC Blood Culture adequate volume   Culture   Final    NO GROWTH < 24 HOURS Performed at [facility], [address], [place], [place] [address]    Report Status PENDING  Incomplete         Radiology Studies: CT HEAD WO CONTRAST  Result Date: 02/02/2020 CLINICAL DATA:  TIA EXAM: CT HEAD WITHOUT CONTRAST TECHNIQUE: Contiguous axial images were obtained from the base of the skull through the vertex without intravenous contrast. COMPARISON:  01/20/2020 FINDINGS: Brain: No acute infarct or hemorrhage. Stable hypodensities in the periventricular white matter compatible with chronic small vessel ischemic change. Lateral ventricles and midline structures are unremarkable. No acute extra-axial fluid collections. No mass effect. Vascular: No hyperdense vessel or unexpected calcification. Skull: Normal. Negative for fracture or focal lesion. Sinuses/Orbits: No acute finding. Other: None. IMPRESSION: 1. Stable head CT, no acute process. Electronically Signed   By: Sharlet Salina M.D.   On: 02/02/2020 16:16   DG Chest Port 1 View  Result Date: 02/02/2020 CLINICAL DATA:  Respiratory distress EXAM: PORTABLE CHEST 1 VIEW COMPARISON:  January 29, 2020 FINDINGS: Lungs are clear. Heart size and pulmonary vascularity are within normal limits. No adenopathy. There is aortic atherosclerosis. No bone lesions. IMPRESSION: No edema or airspace opacity. Heart size within normal limits. Aortic Atherosclerosis (ICD10-I70.0). Electronically Signed   By: Bretta Bang III M.D.   On: 02/02/2020 10:36        Scheduled Meds: . aspirin  81 mg Oral Daily  . carvedilol   6.25 mg Oral BID WC  .  enoxaparin (LOVENOX) injection  30 mg Subcutaneous Q24H  . finasteride  5 mg Oral Daily  . ipratropium-albuterol  3 mL Nebulization BID  . ketotifen  1 drop Both Eyes BID  . levothyroxine  25 mcg Oral QAC breakfast  . memantine  10 mg Oral BID  . polyethylene glycol  17 g Oral Daily  . QUEtiapine  25 mg Oral Daily   And  . QUEtiapine  50 mg Oral QHS  . rivastigmine  3 mg Oral BID  . simvastatin  20 mg Oral q1800  . sodium chloride flush  3 mL Intravenous Q12H  . tamsulosin  0.4 mg Oral Daily   Continuous Infusions: . sodium chloride    . ceFEPime (MAXIPIME) IV 1 g (02/03/20 1000)  . vancomycin 1,000 mg (02/03/20 1357)     LOS: 14 days        Kathlen Mody, MD [facility]   To contact the attending provider between 7A-7P or the covering provider during after hours 7P-7A, please log into the web site [email or web address] and access using universal [place] password for that web site. If you do not have the password, please call the hospital operator.  02/03/2020, 2:06 PM

## 2020-02-03 NOTE — Progress Notes (Addendum)
In and out scan not done. Patient urinated before the procedure. Post residual scan was 179 mL

## 2020-02-03 NOTE — TOC Progression Note (Addendum)
Transition of Care [facility]) - Progression Note    Patient Details  Name: Shaun Moore MRN: [id number] Date of Birth: [birth year]  Transition of Care [facility]) CM/SW Contact  Mearl Latin, LCSW Phone Number: 02/03/2020, 2:48 PM  Clinical Narrative:    New SNF authorization received: Effective 12/1 - 12/3 . Talbot Grumbling ID# [id number] Virgel Gess following.   CSW spoke with [place] at [facility] and she confirmed that they are still able to accept patient as long as he is doing well on Seroquel.    Expected Discharge Plan: Skilled Nursing Facility Barriers to Discharge: SNF Pending transportation, Barriers Resolved  Expected Discharge Plan and Services Expected Discharge Plan: Skilled Nursing Facility In-house Referral: Clinical Social Work Discharge Planning Services: CM Consult   Living arrangements for the past 2 months: Single Family Home Expected Discharge Date: 01/27/20                                     Social Determinants of Health (SDOH) Interventions    Readmission Risk Interventions No flowsheet data found.

## 2020-02-03 NOTE — Progress Notes (Signed)
PT Cancellation Note  Patient Details Name: Shaun Moore MRN: [id number] DOB: June 03, 1925   Cancelled Treatment:    Reason Eval/Treat Not Completed: Patient's level of consciousness. Pt lethargic, not responding to verbal/tactile stimuli or changes in bed positioning. RN present and aware. PT will follow up as time allows.   Arlyss Gandy 02/03/2020, 4:06 PM

## 2020-02-04 LAB — URINALYSIS, ROUTINE W REFLEX MICROSCOPIC
Bilirubin Urine: NEGATIVE
Glucose, UA: NEGATIVE mg/dL
Ketones, ur: 5 mg/dL — AB
Nitrite: NEGATIVE
Protein, ur: 30 mg/dL — AB
Specific Gravity, Urine: 1.023 (ref 1.005–1.030)
WBC, UA: >50 WBC/hpf — ABNORMAL HIGH (ref 0–5)
pH: 5 (ref 5.0–8.0)

## 2020-02-04 LAB — CBC
HCT: 39.1 % (ref 39.0–52.0)
Hemoglobin: 12.6 g/dL — ABNORMAL LOW (ref 13.0–17.0)
MCH: 31.6 pg (ref 26.0–34.0)
MCHC: 32.2 g/dL (ref 30.0–36.0)
MCV: 98 fL (ref 80.0–100.0)
Platelets: 196 10*3/uL (ref 150–400)
RBC: 3.99 MIL/uL — ABNORMAL LOW (ref 4.22–5.81)
RDW: 13.7 % (ref 11.5–15.5)
WBC: 22.4 10*3/uL — ABNORMAL HIGH (ref 4.0–10.5)
nRBC: 0 % (ref 0.0–0.2)

## 2020-02-04 LAB — BASIC METABOLIC PANEL
Anion gap: 13 (ref 5–15)
BUN: 35 mg/dL — ABNORMAL HIGH (ref 8–23)
CO2: 32 mmol/L (ref 22–32)
Calcium: 8.3 mg/dL — ABNORMAL LOW (ref 8.9–10.3)
Chloride: 96 mmol/L — ABNORMAL LOW (ref 98–111)
Creatinine, Ser: 1.39 mg/dL — ABNORMAL HIGH (ref 0.61–1.24)
GFR, Estimated: 48 mL/min — ABNORMAL LOW (ref >60)
Glucose, Bld: 118 mg/dL — ABNORMAL HIGH (ref 70–99)
Potassium: 3.6 mmol/L (ref 3.5–5.1)
Sodium: 141 mmol/L (ref 135–145)

## 2020-02-04 NOTE — Progress Notes (Signed)
Occupational Therapy Treatment Patient Details Name: Shaun Moore MRN: [id number] DOB: 05/20/1925 Today's Date: 02/04/2020    History of present illness 86 y.o. male who was brought to the ED on 01/20/2020 for multiple falls, altered mental status.  PMH significant for Alzheimer's dementia, BPH, hypertension, GERD, back pain and OA. Patient fell about a week ago and struck his head on the ground but refused to be evaluated at the time.  Over the last 1 week, he has become increasingly agitated, more confused and difficult to reorient.  His gait is unsteady and he has had multiple falls despite the use of RW. Pt fell on 11/17 and was brought to the ED due to LBP.   OT comments  OT/PT co-treatment session with focus on self-care re-education, functional transfers, and cervical ROM to facilitate soft tissue elongation. Patient fairly obtunded this session requiring maximal multimodal cues to keep eyes open. RN reports periods of greater arousal/alertness earlier in the a.m. Patient following 1-step verbal commands with 50% accuracy including during self-feeding requiring Julis A to scoop/spear items on tray and bring to mouth with hand over hand assist and cues to swallow. Patient would benefit from continued acute OT services to maximize safety and independence with self-care tasks and decrease caregiver burden. Continued recommendation for SNF rehab given patients CLOF.    Follow Up Recommendations  SNF    Equipment Recommendations  None recommended by OT    Recommendations for Other Services      Precautions / Restrictions Precautions Precautions: Fall Restrictions Weight Bearing Restrictions: No       Mobility Bed Mobility Overal bed mobility: Needs Assistance Bed Mobility: Supine to Sit     Supine to sit: Total assist;+2 for physical assistance;HOB elevated     General bed mobility comments: Attempted to allow pt to move legs off EOB, but no activation noted despite extra time and  Foday cues. Thereby, required TAx2 to sit up EOB, with pt's eyes then opening.  Transfers Overall transfer level: Needs assistance Equipment used: Rolling walker (2 wheeled) Transfers: Stand Pivot Transfers;Sit to/from Stand Sit to Stand: Mod assist;+2 physical assistance Stand pivot transfers: Leveon assist;+2 physical assistance       General transfer comment: Required modAx2 and hand-over-hand placement and maintenance of hands on bed to push up to stand, with pt activating once initiated. MaxAx2 to maintain balance due to posterior lean and to manage RW during stand step to L to recliner, with pt not moving feet unless RW was moved.    Balance Overall balance assessment: Needs assistance Sitting-balance support: Feet supported;Bilateral upper extremity supported Sitting balance-[name] Scale: Poor Sitting balance - Comments: UE support and min-modA to maintain static sitting balance EOB. Postural control: Posterior lean Standing balance support: Bilateral upper extremity supported Standing balance-[name] Scale: Poor Standing balance comment: MaxAx2 with UE support on RW to maintain standing balance due to strong posterior lean.                           ADL either performed or assessed with clinical judgement   ADL Overall ADL's : Needs assistance/impaired Eating/Feeding: Maximal assistance Eating/Feeding Details (indicate cue type and reason): Cues for initiation and swallowing. Patient continues to chew if not cued to swallow.  Grooming: Maximal assistance Grooming Details (indicate cue type and reason): Betty A with hand over hand and 1-step verbal commands to wash face.             Lower  Body Dressing: Total assistance Lower Body Dressing Details (indicate cue type and reason): Total A to adjust footwear in supine.  Toilet Transfer: Maximal assistance;+2 for physical assistance;+2 for safety/equipment Toilet Transfer Details (indicate cue type and reason): Simulated  with stand-pivot transfer to recliner with Lamari A +2 and multimodal cues for hand placement and foot placement. Patient required tactile cues to advance RLE during transfer. Strong posterior lean throughout.                  Vision       Perception     Praxis      Cognition Arousal/Alertness: Lethargic Behavior During Therapy: Flat affect Overall Cognitive Status: No family/caregiver present to determine baseline cognitive functioning Area of Impairment: Attention;Following commands;Safety/judgement;Awareness;Problem solving                   Current Attention Level: Divided Memory: Decreased short-term memory (hx of dementia) Following Commands: Follows one step commands inconsistently;Follows one step commands with increased time Safety/Judgement: Decreased awareness of safety;Decreased awareness of deficits Awareness: Intellectual Problem Solving: Slow processing;Difficulty sequencing;Decreased initiation;Requires verbal cues;Requires tactile cues General Comments: Pt intermittently opening eyes to multiple stimuli, but otherwise lethargic. Required assistance to initiate all tasks to get pt to activate and assist during session. Pt unaware of deficits and not correcting himself to maintain his safety, thereby requiring extensive assistance to do so.        Exercises Exercises: Other exercises Other Exercises Other Exercises: AAROM in cervical neck for rotation.    Shoulder Instructions       General Comments      Pertinent Vitals/ Pain       Pain Assessment: Faces Faces Pain Scale: Hurts a little bit Pain Location: General discomfort. Patient c/o pain in R foot when OT adjusted sock. Unable to specify exact location of pain.  Pain Intervention(s): Limited activity within patient's tolerance;Monitored during session  Home Living                                          Prior Functioning/Environment              Frequency  Min  2X/week        Progress Toward Goals  OT Goals(current goals can now be found in the care plan section)  Progress towards OT goals: Progressing toward goals  Acute Rehab OT Goals Patient Stated Goal: to have his back scratched OT Goal Formulation: Patient unable to participate in goal setting Time For Goal Achievement: 02/18/20 Potential to Achieve Goals: Fair ADL Goals Pt Will Perform Eating: with set-up;sitting Pt Will Perform Grooming: with set-up;sitting Pt Will Transfer to Toilet: with min assist;stand pivot transfer Additional ADL Goal #1: Pt will complete bed mobility with minimal assistance in preparation for ADL/IADL.  Plan Discharge plan remains appropriate;Frequency remains appropriate    Co-evaluation    PT/OT/SLP Co-Evaluation/Treatment: Yes Reason for Co-Treatment: Necessary to address cognition/behavior during functional activity;For patient/therapist safety;To address functional/ADL transfers PT goals addressed during session: Mobility/safety with mobility;Balance;Proper use of DME OT goals addressed during session: ADL's and self-care;Strengthening/ROM      AM-PAC OT "6 Clicks" Daily Activity     Outcome Measure   Help from another person eating meals?: A Lot Help from another person taking care of personal grooming?: A Lot Help from another person toileting, which includes using toliet, bedpan, or urinal?: A Lot  Help from another person bathing (including washing, rinsing, drying)?: A Lot Help from another person to put on and taking off regular upper body clothing?: A Lot Help from another person to put on and taking off regular lower body clothing?: Total 6 Click Score: 11    End of Session Equipment Utilized During Treatment: Gait belt;Rolling walker;Oxygen  OT Visit Diagnosis: Other abnormalities of gait and mobility (R26.89);Muscle weakness (generalized) (M62.81);Other symptoms and signs involving cognitive function   Activity Tolerance Patient  limited by lethargy   Patient Left in chair;with call bell/phone within reach;with chair alarm set;with nursing/sitter in room   Nurse Communication Mobility status;Other (comment) (Patient level of arousal )        Time: 1610-9604 OT Time Calculation (min): 26 min  Charges: OT General Charges $OT Visit: 1 Visit OT Treatments $Self Care/Home Management : 8-22 mins  Eulala Newcombe H. OTR/L Supplemental OT, Department of rehab services [phone]   Pinchus Weckwerth R H. 02/04/2020, 2:10 PM

## 2020-02-04 NOTE — Progress Notes (Signed)
PROGRESS NOTE    Shaun Moore  HWE:[id number] DOB: 10/01/1925 DOA: 01/20/2020 PCP: Lupita Raider, MD    Chief Complaint  Patient presents with  . Altered Mental Status  . Fall    Brief Narrative:   86 year old gentleman with history of Alzheimer's dementia, hypertension, BPH, GERD, osteoarthritis who lives with his wife, son, daughter-in-law has been progressively getting a weaker over the past 1 week.  On arrival to ED patient was delirious so far no evidence of infection was noted therapy evaluation recommended SNF.  His altered mental status and encephalopathy improved initially with some IV fluids.  While waiting for bed placement patient became febrile.  Blood cultures have been negative so far.  CT of the head without contrast is negative.  Chest x-ray does not show any opacity. Labs revealed worsening of creatinine from 1.3-1.6 in the last 24 hours. Patient seen and examined, no new complaints.  Fever resolved. Assessment & Plan:   Principal Problem:   AMS (altered mental status) Active Problems:   Coronary artery disease involving native coronary artery of native heart without angina pectoris   Essential hypertension, benign   Frequent falls   Alzheimer's dementia with behavioral disturbance (HCC)   Rhabdomyolysis   Mild AKI probably secondary to dehydration and urinary retention.  Recommend IV fluids for 24 hours and follow creatinine in the morning.  Creatinine has improved to 1.3 from 1.6 today.  Recommend to continue IV fluids    Mild acute encephalopathy Possibly secondary to mild AKI, worsening dementia and clinical deterioration.  Patient still lethargic but would open his eyes and speak briefly, needs frequent redirection and encouragement.  It appears that this is patient's new baseline.    Essential hypertension Blood pressure parameters are optimal at this time.   Advanced Alzheimer's dementia Resume home medications at this time.   Fever Unclear  etiology Chest x-ray is negative, will repeat blood cultures have been negative so far Urine analysis shows large leukocytes and rare bacteria. Patient was empirically started on vancomycin and cefepime, if blood cultures remains negative for the next 24 hours we will transition to IV Rocephin for possible UTI.   In view of his poor progression recommend palliative care consult for goals of care discussion at this time. Patient was able to work with PT today and plan for SNF tomorrow.    DVT prophylaxis: (Lovenox) Code Status: DNR Family Communication: None at bedside Disposition:   Status is: Inpatient  Remains inpatient appropriate because:Ongoing diagnostic testing needed not appropriate for outpatient work up, Unsafe d/c plan and IV treatments appropriate due to intensity of illness or inability to take PO   Dispo: The patient is from: Home              Anticipated d/c is to: SNF              Anticipated d/c date is: 2 days              Patient currently is not medically stable to d/c.       Consultants:   Palliative care  Procedures: None Antimicrobials  Antibiotics Given (last 72 hours)    Date/Time Action Medication Dose Rate   02/02/20 1138 New Bag/Given   ceFEPIme (MAXIPIME) 2 g in sodium chloride 0.9 % 100 mL IVPB 2 g 200 mL/hr   02/02/20 1228 New Bag/Given   vancomycin (VANCOREADY) IVPB 1250 mg/250 mL 1,250 mg 166.7 mL/hr   02/03/20 0000 New Bag/Given   ceFEPIme (MAXIPIME)  1 g in sodium chloride 0.9 % 100 mL IVPB 1 g 200 mL/hr   02/03/20 1000 New Bag/Given   ceFEPIme (MAXIPIME) 1 g in sodium chloride 0.9 % 100 mL IVPB 1 g 200 mL/hr   02/03/20 1357 New Bag/Given   vancomycin (VANCOCIN) IVPB 1000 mg/200 mL premix 1,000 mg 200 mL/hr   02/03/20 2144 New Bag/Given   ceFEPIme (MAXIPIME) 1 g in sodium chloride 0.9 % 100 mL IVPB 1 g 200 mL/hr   02/04/20 1103 New Bag/Given   ceFEPIme (MAXIPIME) 1 g in sodium chloride 0.9 % 100 mL IVPB 1 g 200 mL/hr   02/04/20 1607  New Bag/Given   vancomycin (VANCOCIN) IVPB 1000 mg/200 mL premix 1,000 mg 200 mL/hr       Subjective: Patient is alert following simple commands does not appear to be in any distress currently on 2 to 3 L of nasal cannula oxygen. Objective: Vitals:   02/04/20 0353 02/04/20 0734 02/04/20 1142 02/04/20 1619  BP: 131/67 129/72 (!) 152/77 111/63  Pulse: 84 91 88 69  Resp: 17 18 18 17   Temp: 97.7 F (36.5 C) 98 F (36.7 C) 98.2 F (36.8 C) 98.9 F (37.2 C)  TempSrc: Oral Oral Oral   SpO2: (!) 89% (!) 88% 94%   Weight:        Intake/Output Summary (Last 24 hours) at 02/04/2020 1630 Last data filed at 02/04/2020 1500 Gross per 24 hour  Intake 0 ml  Output 500 ml  Net -500 ml   Filed Weights   01/20/20 2246  Weight: 87.3 kg    Examination:  General exam: Alert and comfortable Respiratory system: Air entry fair bilateral, no wheezing or rhonchi Cardiovascular system: S1-S2 heard, regular rate rhythm, no JVD no pedal edema Gastrointestinal system: Abdomen is soft, nontender bowel sounds normal. Central nervous system: Would wake up to verbal cues, briefly would respond to questions. Extremities: No pedal edema.  Skin: No rashes seen Psychiatry: .  Cannot be assessed    Data Reviewed: I have personally reviewed following labs and imaging studies  CBC: Recent Labs  Lab 01/31/20 0331 02/01/20 0516 02/02/20 0630 02/03/20 0438 02/04/20 0442  WBC 12.3* 18.7* 10.2 40.8* 22.4*  HGB 14.2 12.6* 14.6 12.2* 12.6*  HCT 42.9 39.5 46.6 38.1* 39.1  MCV 94.5 97.1 97.5 97.2 98.0  PLT 274 286 263 221 196    Basic Metabolic Panel: Recent Labs  Lab 01/29/20 0401 01/29/20 0401 01/30/20 0327 01/30/20 0327 01/31/20 0331 02/01/20 0516 02/02/20 0630 02/03/20 0438 02/04/20 0442  NA 141   < > 141   < > 140 138 140 140 141  K 3.8   < > 3.1*   < > 4.1 3.4* 5.2* 3.5 3.6  CL 100   < > 98   < > 94* 92* 91* 94* 96*  CO2 32   < > 34*   < > 32 35* 36* 33* 32  GLUCOSE 104*   < > 117*    < > 123* 120* 114* 119* 118*  BUN 19   < > 15   < > 11 15 20  32* 35*  CREATININE 1.09   < > 1.06   < > 1.01 1.35* 1.39* 1.67* 1.39*  CALCIUM 8.7*   < > 8.6*   < > 8.8* 8.4* 9.0 8.3* 8.3*  MG 2.1  --  1.9  --  1.9 1.8  --   --   --    < > = values in this interval  not displayed.    GFR: Estimated Creatinine Clearance: 35 mL/min (A) (by C-G formula based on SCr of 1.39 mg/dL (H)).  Liver Function Tests: No results for input(s): AST, ALT, ALKPHOS, BILITOT, PROT, ALBUMIN in the last 168 hours.  CBG: Recent Labs  Lab 02/02/20 0933  GLUCAP 94     Recent Results (from the past 240 hour(s))  Respiratory Panel by RT PCR (Flu A&B, Covid) - Nasopharyngeal Swab     Status: None   Collection Time: 01/27/20 11:54 AM   Specimen: Nasopharyngeal Swab; Nasopharyngeal(NP) swabs in vial transport medium  Result Value Ref Range Status   SARS Coronavirus 2 by RT PCR NEGATIVE NEGATIVE Final    Comment: (NOTE) SARS-CoV-2 target nucleic acids are NOT DETECTED.  The SARS-CoV-2 RNA is generally detectable in upper respiratoy specimens during the acute phase of infection. The lowest concentration of SARS-CoV-2 viral copies this assay can detect is 131 copies/mL. A negative result does not preclude SARS-Cov-2 infection and should not be used as the sole basis for treatment or other patient management decisions. A negative result may occur with  improper specimen collection/handling, submission of specimen other than nasopharyngeal swab, presence of viral mutation(s) within the areas targeted by this assay, and inadequate number of viral copies (<131 copies/mL). A negative result must be combined with clinical observations, patient history, and epidemiological information. The expected result is Negative.  Fact Sheet for Patients:  https://www.moore.com/  Fact Sheet for Healthcare Providers:  https://www.young.biz/  This test is no t yet approved or cleared  by the [place] FDA and  has been authorized for detection and/or diagnosis of SARS-CoV-2 by FDA under an Emergency Use Authorization (EUA). This EUA will remain  in effect (meaning this test can be used) for the duration of the COVID-19 declaration under Section 564(b)(1) of the Act, 21 U.S.C. section 360bbb-3(b)(1), unless the authorization is terminated or revoked sooner.     Influenza A by PCR NEGATIVE NEGATIVE Final   Influenza B by PCR NEGATIVE NEGATIVE Final    Comment: (NOTE) The Xpert Xpress SARS-CoV-2/FLU/RSV assay is intended as an aid in  the diagnosis of influenza from Nasopharyngeal swab specimens and  should not be used as a sole basis for treatment. Nasal washings and  aspirates are unacceptable for Xpert Xpress SARS-CoV-2/FLU/RSV  testing.  Fact Sheet for Patients: https://www.moore.com/  Fact Sheet for Healthcare Providers: https://www.young.biz/  This test is not yet approved or cleared by the [place] FDA and  has been authorized for detection and/or diagnosis of SARS-CoV-2 by  FDA under an Emergency Use Authorization (EUA). This EUA will remain  in effect (meaning this test can be used) for the duration of the  Covid-19 declaration under Section 564(b)(1) of the Act, 21  U.S.C. section 360bbb-3(b)(1), unless the authorization is  terminated or revoked. Performed at [facility], [address], [place], [place] [address]   Resp Panel by RT-PCR (Flu A&B, Covid) Nasopharyngeal Swab     Status: None   Collection Time: 01/29/20  4:47 PM   Specimen: Nasopharyngeal Swab; Nasopharyngeal(NP) swabs in vial transport medium  Result Value Ref Range Status   SARS Coronavirus 2 by RT PCR NEGATIVE NEGATIVE Final    Comment: (NOTE) SARS-CoV-2 target nucleic acids are NOT DETECTED.  The SARS-CoV-2 RNA is generally detectable in upper respiratory specimens during the acute phase of infection. The  lowest concentration of SARS-CoV-2 viral copies this assay can detect is 138 copies/mL. A negative result does not preclude SARS-Cov-2  infection and should not be used as the sole basis for treatment or other patient management decisions. A negative result may occur with  improper specimen collection/handling, submission of specimen other than nasopharyngeal swab, presence of viral mutation(s) within the areas targeted by this assay, and inadequate number of viral copies(<138 copies/mL). A negative result must be combined with clinical observations, patient history, and epidemiological information. The expected result is Negative.  Fact Sheet for Patients:  BloggerCourse.com  Fact Sheet for Healthcare Providers:  SeriousBroker.it  This test is no t yet approved or cleared by the [place] FDA and  has been authorized for detection and/or diagnosis of SARS-CoV-2 by FDA under an Emergency Use Authorization (EUA). This EUA will remain  in effect (meaning this test can be used) for the duration of the COVID-19 declaration under Section 564(b)(1) of the Act, 21 U.S.C.section 360bbb-3(b)(1), unless the authorization is terminated  or revoked sooner.       Influenza A by PCR NEGATIVE NEGATIVE Final   Influenza B by PCR NEGATIVE NEGATIVE Final    Comment: (NOTE) The Xpert Xpress SARS-CoV-2/FLU/RSV plus assay is intended as an aid in the diagnosis of influenza from Nasopharyngeal swab specimens and should not be used as a sole basis for treatment. Nasal washings and aspirates are unacceptable for Xpert Xpress SARS-CoV-2/FLU/RSV testing.  Fact Sheet for Patients: BloggerCourse.com  Fact Sheet for Healthcare Providers: SeriousBroker.it  This test is not yet approved or cleared by the [place] FDA and has been authorized for detection and/or diagnosis of SARS-CoV-2 by FDA under  an Emergency Use Authorization (EUA). This EUA will remain in effect (meaning this test can be used) for the duration of the COVID-19 declaration under Section 564(b)(1) of the Act, 21 U.S.C. section 360bbb-3(b)(1), unless the authorization is terminated or revoked.  Performed at [facility], [address], [place], [place] [address]   Culture, blood (routine x 2)     Status: None (Preliminary result)   Collection Time: 02/02/20 11:41 AM   Specimen: BLOOD RIGHT ARM  Result Value Ref Range Status   Specimen Description BLOOD RIGHT ARM  Final   Special Requests   Final    BOTTLES DRAWN AEROBIC AND ANAEROBIC Blood Culture adequate volume   Culture   Final    NO GROWTH 2 DAYS Performed at [facility], [address] [place], [place] [address]    Report Status PENDING  Incomplete  Culture, blood (routine x 2)     Status: None (Preliminary result)   Collection Time: 02/02/20 11:42 AM   Specimen: BLOOD RIGHT HAND  Result Value Ref Range Status   Specimen Description BLOOD RIGHT HAND  Final   Special Requests   Final    BOTTLES DRAWN AEROBIC AND ANAEROBIC Blood Culture adequate volume   Culture   Final    NO GROWTH 2 DAYS Performed at [facility], [address], [place], [place] [address]    Report Status PENDING  Incomplete         Radiology Studies: No results found.      Scheduled Meds: . aspirin  81 mg Oral Daily  . carvedilol  6.25 mg Oral BID WC  . enoxaparin (LOVENOX) injection  30 mg Subcutaneous Q24H  . finasteride  5 mg Oral Daily  . ipratropium-albuterol  3 mL Nebulization BID  . ketotifen  1 drop Both Eyes BID  . levothyroxine  25 mcg Oral QAC breakfast  . memantine  10 mg  Oral BID  . polyethylene glycol  17 g Oral Daily  . QUEtiapine  25 mg Oral Daily   And  . QUEtiapine  50 mg Oral QHS  . rivastigmine  3 mg Oral BID  . simvastatin  20 mg Oral q1800  . sodium chloride flush  3 mL Intravenous Q12H  . tamsulosin  0.4 mg Oral  Daily   Continuous Infusions: . sodium chloride    . sodium chloride 75 mL/hr at 02/04/20 0641  . ceFEPime (MAXIPIME) IV 1 g (02/04/20 1103)  . vancomycin 1,000 mg (02/04/20 1607)     LOS: 15 days        Kathlen ModyVijaya Johnsie Moscoso, MD [facility]   To contact the attending provider between 7A-7P or the covering provider during after hours 7P-7A, please log into the web site [email or web address] and access using universal [place] password for that web site. If you do not have the password, please call the hospital operator.  02/04/2020, 4:30 PM

## 2020-02-04 NOTE — Progress Notes (Signed)
Physical Therapy Treatment Patient Details Name: Shaun Moore MRN: [id number] DOB: Sep 10, 1925 Today's Date: 02/04/2020    History of Present Illness 86 y.o. male who was brought to the ED on 01/20/2020 for multiple falls, altered mental status.  PMH significant for Alzheimer's dementia, BPH, hypertension, GERD, back pain and OA. Patient fell about a week ago and struck his head on the ground but refused to be evaluated at the time.  Over the last 1 week, he has become increasingly agitated, more confused and difficult to reorient.  His gait is unsteady and he has had multiple falls despite the use of RW. Pt fell on 11/17 and was brought to the ED due to LBP.    PT Comments    Treated pt in conjunction with OT to maximize safety and quality of session. Pt able to be aroused this session but still very lethargic, limiting gait progression or progress towards his goals. He would open his eyes or speak intermittently. He demonstrates a strong posterior lean that impacts his balance in all upright positions, resulting in him requiring maxAx2 to take several side steps to the L to transfer to the chair or to maintain standing balance with use of a RW. He did not initiate or assist at all with supine > sit transitions and thus was TA x2. Pt required physical assistance of modAx2 to initiate transfer to stand with pt then activating and assisting after being initiated. The RW had to be directed to encourage pt to take any steps. Will continue to follow acutely. Current recommendations remain appropriate.     Follow Up Recommendations  SNF     Equipment Recommendations  Wheelchair (measurements PT);Hospital bed    Recommendations for Other Services       Precautions / Restrictions Precautions Precautions: Fall Restrictions Weight Bearing Restrictions: No    Mobility  Bed Mobility Overal bed mobility: Needs Assistance Bed Mobility: Supine to Sit     Supine to sit: Total assist;+2 for physical  assistance;HOB elevated     General bed mobility comments: Attempted to allow pt to move legs off EOB, but no activation noted despite extra time and Shaun Moore cues. Thereby, required TAx2 to sit up EOB, with pt's eyes then opening.  Transfers Overall transfer level: Needs assistance Equipment used: Rolling walker (2 wheeled) Transfers: Stand Pivot Transfers;Sit to/from Stand Sit to Stand: Mod assist;+2 physical assistance Stand pivot transfers: Ison assist;+2 physical assistance       General transfer comment: Required modAx2 and hand-over-hand placement and maintenance of hands on bed to push up to stand, with pt activating once initiated. MaxAx2 to maintain balance due to posterior lean and to manage RW during stand step to L to recliner, with pt not moving feet unless RW was moved.  Ambulation/Gait Ambulation/Gait assistance: Shaun Moore assist;+2 physical assistance Gait Distance (Feet): 3 Feet Assistive device: Rolling walker (2 wheeled) Gait Pattern/deviations: Shuffle;Decreased stride length;Leaning posteriorly Gait velocity: reduced Gait velocity interpretation: <1.31 ft/sec, indicative of household ambulator General Gait Details: Pt leans posteriorly and requires maxAx2 and movement of RW by therapist for pt to initiate steps to side step to recliner.   Stairs             Wheelchair Mobility    Modified Rankin (Stroke Patients Only)       Balance Overall balance assessment: Needs assistance Sitting-balance support: Feet supported;Bilateral upper extremity supported Sitting balance-Shaun Moore Scale: Poor Sitting balance - Comments: UE support and min-modA to maintain static sitting balance EOB. Postural  control: Posterior lean Standing balance support: Bilateral upper extremity supported Standing balance-Shaun Moore Scale: Poor Standing balance comment: MaxAx2 with UE support on RW to maintain standing balance due to strong posterior lean.                             Cognition Arousal/Alertness: Lethargic Behavior During Therapy: Flat affect Overall Cognitive Status: No family/caregiver present to determine baseline cognitive functioning Area of Impairment: Attention;Following commands;Safety/judgement;Awareness;Problem solving                   Current Attention Level: Divided Memory: Decreased short-term memory (hx of dementia) Following Commands: Follows one step commands inconsistently;Follows one step commands with increased time Safety/Judgement: Decreased awareness of safety;Decreased awareness of deficits Awareness: Intellectual Problem Solving: Slow processing;Difficulty sequencing;Decreased initiation;Requires verbal cues;Requires tactile cues General Comments: Pt intermittently opening eyes to multiple stimuli, but otherwise lethargic. Required assistance to initiate all tasks to get pt to activate and assist during session. Pt unaware of deficits and not correcting himself to maintain his safety, thereby requiring extensive assistance to do so.      Exercises      General Comments        Pertinent Vitals/Pain Pain Assessment: Faces Faces Pain Scale: No hurt Pain Intervention(s): Limited activity within patient's tolerance;Monitored during session;Repositioned    Home Living                      Prior Function            PT Goals (current goals can now be found in the care plan section) Acute Rehab PT Goals Patient Stated Goal: to have his back scratched PT Goal Formulation: Patient unable to participate in goal setting Time For Goal Achievement: 02/18/20 Potential to Achieve Goals: Poor Progress towards PT goals: Progressing toward goals (slowly)    Frequency    Min 2X/week      PT Plan Current plan remains appropriate    Co-evaluation PT/OT/SLP Co-Evaluation/Treatment: Yes Reason for Co-Treatment: Necessary to address cognition/behavior during functional activity;For patient/therapist safety;To  address functional/ADL transfers PT goals addressed during session: Mobility/safety with mobility;Balance;Proper use of DME        AM-PAC PT "6 Clicks" Mobility   Outcome Measure  Help needed turning from your back to your side while in a flat bed without using bedrails?: A Lot Help needed moving from lying on your back to sitting on the side of a flat bed without using bedrails?: Total Help needed moving to and from a bed to a chair (including a wheelchair)?: A Lot Help needed standing up from a chair using your arms (e.g., wheelchair or bedside chair)?: A Lot Help needed to walk in hospital room?: A Lot Help needed climbing 3-5 steps with a railing? : Total 6 Click Score: 10    End of Session Equipment Utilized During Treatment: Gait belt;Oxygen Activity Tolerance: Patient limited by lethargy Patient left: in chair;with call bell/phone within reach;with chair alarm set Nurse Communication: Mobility status PT Visit Diagnosis: Unsteadiness on feet (R26.81);Muscle weakness (generalized) (M62.81);Difficulty in walking, not elsewhere classified (R26.2);History of falling (Z91.81)     Time: 1601-0932 PT Time Calculation (min) (ACUTE ONLY): 26 min  Charges:  $Therapeutic Activity: 8-22 mins                     Raymond Gurney, PT, DPT Acute Rehabilitation Services  Pager: [phone] Office: [phone]    Darnelle Maffucci  M Pettis 02/04/2020, 1:54 PM

## 2020-02-05 DIAGNOSIS — Z66 Do not resuscitate: Secondary | ICD-10-CM

## 2020-02-05 DIAGNOSIS — Z7189 Other specified counseling: Secondary | ICD-10-CM

## 2020-02-05 DIAGNOSIS — Z515 Encounter for palliative care: Secondary | ICD-10-CM

## 2020-02-05 LAB — BASIC METABOLIC PANEL
Anion gap: 12 (ref 5–15)
BUN: 28 mg/dL — ABNORMAL HIGH (ref 8–23)
CO2: 31 mmol/L (ref 22–32)
Calcium: 8.2 mg/dL — ABNORMAL LOW (ref 8.9–10.3)
Chloride: 99 mmol/L (ref 98–111)
Creatinine, Ser: 1.33 mg/dL — ABNORMAL HIGH (ref 0.61–1.24)
GFR, Estimated: 50 mL/min — ABNORMAL LOW (ref >60)
Glucose, Bld: 110 mg/dL — ABNORMAL HIGH (ref 70–99)
Potassium: 3.9 mmol/L (ref 3.5–5.1)
Sodium: 142 mmol/L (ref 135–145)

## 2020-02-05 LAB — CBC
HCT: 38.5 % — ABNORMAL LOW (ref 39.0–52.0)
Hemoglobin: 12 g/dL — ABNORMAL LOW (ref 13.0–17.0)
MCH: 31.1 pg (ref 26.0–34.0)
MCHC: 31.2 g/dL (ref 30.0–36.0)
MCV: 99.7 fL (ref 80.0–100.0)
Platelets: 204 10*3/uL (ref 150–400)
RBC: 3.86 MIL/uL — ABNORMAL LOW (ref 4.22–5.81)
RDW: 13.5 % (ref 11.5–15.5)
WBC: 20 10*3/uL — ABNORMAL HIGH (ref 4.0–10.5)
nRBC: 0 % (ref 0.0–0.2)

## 2020-02-05 LAB — RESP PANEL BY RT-PCR (FLU A&B, COVID) ARPGX2
Influenza A by PCR: NEGATIVE
Influenza B by PCR: NEGATIVE
SARS Coronavirus 2 by RT PCR: NEGATIVE

## 2020-02-05 MED ORDER — QUETIAPINE FUMARATE 25 MG PO TABS: 25.0000 mg | ORAL_TABLET | Freq: Every day | ORAL | 1 refills | Status: AC

## 2020-02-05 MED ORDER — SODIUM CHLORIDE 0.9 % IV SOLN
1.0000 g | INTRAVENOUS | Status: DC
  Administered 2020-02-05: 1 g via INTRAVENOUS
  Filled 2020-02-05: qty 10

## 2020-02-05 MED ORDER — AMOXICILLIN-POT CLAVULANATE 875-125 MG PO TABS: 1.0000 | ORAL_TABLET | Freq: Two times a day (BID) | ORAL | 0 refills | Status: AC

## 2020-02-05 MED ORDER — ENOXAPARIN SODIUM 40 MG/0.4ML ~~LOC~~ SOLN: 40.0000 mg | SUBCUTANEOUS | Status: DC

## 2020-02-05 MED ORDER — IPRATROPIUM-ALBUTEROL 0.5-2.5 (3) MG/3ML IN SOLN: 3.0000 mL | Freq: Four times a day (QID) | RESPIRATORY_TRACT | Status: AC | PRN

## 2020-02-05 MED ORDER — QUETIAPINE FUMARATE 50 MG PO TABS: 50.0000 mg | ORAL_TABLET | Freq: Every day | ORAL | 1 refills | Status: AC

## 2020-02-05 NOTE — TOC Transition Note (Signed)
Transition of Care [facility]) - CM/SW Discharge Note   Patient Details  Name: JAVON SNEE MRN: [id number] Date of Birth: 07-17-1925  [facility] CM/SW Contact:  Lorri Frederick, LCSW Phone Number: 02/05/2020, 1:05 PM   Clinical Narrative:   Pt discharging to [facility] room 131.  RN call report to [phone].     Final next level of care: Skilled Nursing Facility Barriers to Discharge: Barriers Resolved   Patient Goals and CMS Choice   CMS Medicare.gov Compare Post Acute Care list provided to:: Patient Represenative (must comment) Choice offered to / list presented to : Adult Children (son)  Discharge Placement              Patient chooses bed at:  ([facility]) Patient to be transferred to facility by: [facility] Name of family member notified: son, [name] (left message) Patient and family notified of of transfer: 02/05/20  Discharge Plan and Services In-house Referral: Clinical Social Work Discharge Planning Services: CM Consult                                 Social Determinants of Health (SDOH) Interventions     Readmission Risk Interventions No flowsheet data found.

## 2020-02-05 NOTE — Progress Notes (Signed)
Report called to Management consultant at [employer]. All questions answered. Call back number provided.

## 2020-02-05 NOTE — Consult Note (Signed)
Palliative Medicine Inpatient Consult Note  Reason for consult:  Goals of Care  HPI:  Per intake H&P --> 86 year old gentleman with history of Alzheimer's dementia, hypertension, BPH, GERD, osteoarthritis who lives with his wife, son, daughter-in-law has been progressively getting a weaker over the past 1 week.  On arrival to ED patient was delirious so far no evidence of infection was noted therapy evaluation recommended SNF.  His altered mental status and encephalopathy improved initially with some IV fluids.  Palliative care was asked to see this patient to discuss goals of care in the setting of multiple comorbid conditions.  Was asked if patient was hospice eligible in the setting of his Alzheimer's dementia.  Clinical Assessment/Goals of Care: I have reviewed medical records including EPIC notes, labs and imaging, received report from bedside RN, assessed the patient he was working with physical therapy able to get out of bed to chair with two-person assistance.   I called patient's son Shaun Moore to further discuss diagnosis prognosis, GOC, EOL wishes, disposition and options.   I introduced Palliative Medicine as specialized medical care for people living with serious illness. It focuses on providing relief from the symptoms and stress of a serious illness. The goal is to improve quality of life for both the patient and the family.  Shaun Moore shares with me that Shaun Moore is living in [place], [place].  [name] has been married to his wife Shaun Moore for greater than 60 years.  He has 1 son Shaun Moore.  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  He is a man of faith and practices within the [employer] denomination.  In terms of mobility patient had been living with his wife Shaun Moore.  He spent much of the day sleeping and intermittently would wake up with a "spurt of energy".  He had poor safety awareness and was noted to be falling more at home.  He has also been  more aggressive at home with his wife Shaun Moore.  As far as basic activities of daily living he required help with all of them.  We reviewed maxes Alzheimer type dementia and the progression of his disease over the last 2 years which have been the most marked.  He has had a notable functional decline in the last 2 years as well as exemplified behavioral components of the disease.  I educated Shaun Moore on what to expect moving forward with Alzheimer's dementia inclusive of things like aspiration pneumonia and failure to thrive neither of which Taniela seems to have at this moment.  He was thankful for this information and vocalized understanding of.   A detailed discussion was had today regarding advanced directives - none on file. Per Shaun Moore he and his mother make decisions on the patients behalf.   Concepts specific to code status, artifical feeding and hydration, continued IV antibiotics and rehospitalization was had. I completed a MOST form today. The patient and family outlined their wishes for the following treatment decisions:  Cardiopulmonary Resuscitation: Do Not Attempt Resuscitation (DNR/No CPR)  Medical Interventions: Limited Additional Interventions: Use medical treatment, IV fluids and cardiac monitoring as indicated, DO NOT USE intubation or mechanical ventilation. May consider use of less invasive airway support such as BiPAP or CPAP. Also provide comfort measures. Transfer to the hospital if indicated. Avoid intensive care.   Antibiotics: Determine use of limitation of antibiotics when infection occurs  IV Fluids: IV fluids for a defined trial period  Feeding Tube: No feeding tube  The difference between a aggressive medical intervention path  and a palliative comfort care path for this patient at this time was had. Values and goals of care important to patient and family were attempted to be elicited. Patient gets very little joy from anything per his son - he has been this way for sometime  now.  I described hospice as a service for patients for have a life expectancy of < 6 months. It preserves dignity and quality at the end phases of life. The focus changes from curative to symptom relief. Unfortunately Judy seems to be quite strong and has a degree of mobility and present nutrition  that do not qualify for hospice.   Discussed the importance of continued conversation with family and their  medical providers regarding overall plan of care and treatment options, ensuring decisions are within the context of the patients values and GOCs.  Decision Maker: Shaun Moore (spouse) [phone] Shaun Moore (son) [phone]  SUMMARY OF RECOMMENDATIONS   DNAR/DNI  MOST in Chart  DNR on Chart  TOC OP Palliative support through [facility]  Plan to transition to [facility]  Code Status/Advance Care Planning: DNAR/DNI    Palliative Prophylaxis:   Oral care, mobility, delirium precuations  Additional Recommendations (Limitations, Scope, Preferences):  Continue current scope of care  Psycho-social/Spiritual:   Desire for further Chaplaincy support:  No  Additional Recommendations:  Education on progression of Alzheimer's type dementia   Prognosis:  Patient still able to mobilize and appears to have a hearty appetite.  At this point I do not believe he meets hospice eligibility criteria for dementia.  Discharge Planning:  Discharged to skilled nursing facility with outpatient palliative care   Vitals:   02/05/20 0005 02/05/20 0338  BP: (!) 120/54 140/71  Pulse: 91 80  Resp: 18 18  Temp: 98.4 F (36.9 C) 97.8 F (36.6 C)  SpO2: 94% 99%    Intake/Output Summary (Last 24 hours) at 02/05/2020 2500 Last data filed at 02/05/2020 0600 Gross per 24 hour  Intake 853.31 ml  Output 500 ml  Net 353.31 ml   Last Weight  Most recent update: 01/21/2020 12:29 AM   Weight  87.3 kg (192 lb 7.4 oz)           Gen: Elderly Male HEENT: moist mucous membranes CV: Regular  rate and rhythm  PULM: clear to auscultation bilaterally  ABD: soft/nontender  EXT: No edema  Neuro: Somnolent  PPS: 30%   This conversation/these recommendations were discussed with patient primary care team, Dr. Blake Divine  Time In: 1010 Time Out: 1120 Total Time: 70 Greater than 50%  of this time was spent counseling and coordinating care related to the above assessment and plan.  Lamarr Lulas [place] Palliative Medicine Team Team Cell Phone: [phone] Please utilize secure chat with additional questions, if there is no response within 30 minutes please call the above phone number  Palliative Medicine Team providers are available by phone from 7am to 7pm daily and can be reached through the team cell phone.  Should this patient require assistance outside of these hours, please call the patient's attending physician.

## 2020-02-05 NOTE — Progress Notes (Signed)
[facility] ([facility]) Community Based Palliative Care       This patient has been referred to our palliative care services in the community.  [facility] will continue to follow for any discharge planning needs and to coordinate admission onto palliative care.   If you have questions or need assistance, please call [phone] or contact the hospital Liaison listed on AMION.     Thank you for the opportunity to participate in this patient's care.     Chrislyn King, BSN, RN [facility] Hospital Liaison   [phone] (24h on call) 

## 2020-02-05 NOTE — Discharge Summary (Signed)
Physician Discharge Summary  WASEEM SUESS ZOX:[id number] DOB: 02-Feb-1926 DOA: 01/20/2020  PCP: Lupita Raider, MD  Admit date: 01/20/2020 Discharge date: 02/05/2020  Admitted From: HOME. Disposition:  SNF   Recommendations for Outpatient Follow-up:  1. Follow up with PCP in 1-2 weeks 2. Please obtain BMP/CBC in one week Please follow up palliative care at [facility].  Discharge Condition: stable.  CODE STATUS: DNR Diet recommendation: Dysphagia 3 diet.   Brief/Interim Summary: 86 year old gentleman with history of Alzheimer's dementia, hypertension, BPH, GERD, osteoarthritis who lives with his wife, son, daughter-in-law has been progressively getting a weaker over the past 1 week.  On arrival to ED patient was delirious so far no evidence of infection was noted therapy evaluation recommended SNF.  His altered mental status and encephalopathy improved initially with some IV fluids.  While waiting for bed placement patient became febrile.  Blood cultures have been negative so far.  CT of the head without contrast is negative.  Chest x-ray does not show any opacity.  Discharge Diagnoses:  Principal Problem:   AMS (altered mental status) Active Problems:   Coronary artery disease involving native coronary artery of native heart without angina pectoris   Essential hypertension, benign   Frequent falls   Alzheimer's dementia with behavioral disturbance (HCC)   Rhabdomyolysis  Mild AKI probably secondary to dehydration and urinary retention.  Recommend IV fluids for 24 hours and follow creatinine in the morning.  Creatinine has improved to 1.3 from 1.6 today.     Mild acute encephalopathy Possibly secondary to mild AKI, worsening dementia and clinical deterioration.  Patient still lethargic but would open his eyes and speak briefly, needs frequent redirection and encouragement.  It appears that this is patient's new baseline.    Essential hypertension Blood pressure parameters are  optimal at this time.   Advanced Alzheimer's dementia Resume home medications at this time.   Fever Unclear etiology Chest x-ray is negative, Blood cultures negative.  Urine analysis shows large leukocytes and rare bacteria. Patient was empirically started on vancomycin and cefepime, as blood cultures remains negative for the next 24 hours we will transition to oral meds.     Discharge Instructions  Discharge Instructions    Diet - low sodium heart healthy   Complete by: As directed    Discharge instructions   Complete by: As directed    FOLLOW UP WITH PCP IN ONE WEEK     Allergies as of 02/05/2020      Reactions   Lisinopril Cough   Ambien [zolpidem Tartrate]    Stated that is "made him crazy"      Medication List    TAKE these medications   amoxicillin-clavulanate 875-125 MG tablet Commonly known as: Augmentin Take 1 tablet by mouth every 12 (twelve) hours for 5 days.   aspirin 81 MG tablet Take 81 mg by mouth daily.   carvedilol 6.25 MG tablet Commonly known as: COREG Take 1 tablet (6.25 mg total) by mouth 2 (two) times daily with a meal.   finasteride 5 MG tablet Commonly known as: PROSCAR Take 5 mg by mouth daily.   ipratropium-albuterol 0.5-2.5 (3) MG/3ML Soln Commonly known as: DUONEB Take 3 mLs by nebulization 2 (two) times daily.   ipratropium-albuterol 0.5-2.5 (3) MG/3ML Soln Commonly known as: DUONEB Take 3 mLs by nebulization every 6 (six) hours as needed.   ketotifen 0.025 % ophthalmic solution Commonly known as: ZADITOR Place 1 drop into both eyes 2 (two) times daily for 7 days.  levothyroxine 25 MCG tablet Commonly known as: SYNTHROID Take 25 mcg by mouth daily before breakfast.   losartan 50 MG tablet Commonly known as: COZAAR Take 50 mg by mouth daily.   memantine 10 MG tablet Commonly known as: NAMENDA TAKE 1 TABLET BY MOUTH TWICE DAILY What changed:   how much to take  how to take this  when to take  this  additional instructions   QUEtiapine 50 MG tablet Commonly known as: SEROQUEL Take 1 tablet (50 mg total) by mouth at bedtime.   QUEtiapine 25 MG tablet Commonly known as: SEROQUEL Take 1 tablet (25 mg total) by mouth daily. Start taking on: February 06, 2020   rivastigmine 1.5 MG capsule Commonly known as: EXELON TAKE 2 CAPSULES BY MOUTH TWICE DAILY   simvastatin 20 MG tablet Commonly known as: ZOCOR Take 20 mg by mouth daily at 6 PM.   tamsulosin 0.4 MG Caps capsule Commonly known as: FLOMAX Take 0.4 mg by mouth daily.       Contact information for follow-up providers    Lupita Raider, MD. Schedule an appointment as soon as possible for a visit in 1 week(s).   Specialty: Family Medicine Contact information: [address] [place] [address] [phone]            Contact information for after-discharge care    Destination    [facility] .   Service: Skilled Nursing Contact information: [address] [place]. [place] [address] [phone]                 Allergies  Allergen Reactions  . Lisinopril Cough  . Ambien [Zolpidem Tartrate]     Stated that is "made him crazy"    Consultations: Palliative care  Procedures/Studies: DG Chest 2 View  Result Date: 01/20/2020 CLINICAL DATA:  Pain generally in the back and chest. Patient unable to state specifically what is hurting. History of hypertension. EXAM: CHEST - 2 VIEW COMPARISON:  10/19/2018 FINDINGS: Heart is normal in size. No mediastinal or hilar masses. No evidence of adenopathy. Mildly atelectasis above and elevated right hemidiaphragm, similar to the prior study. Lungs otherwise clear. No pleural effusion.  No pneumothorax. Skeletal structures are intact. IMPRESSION: No active cardiopulmonary disease. Electronically Signed   By: Amie Portland M.D.   On: 01/20/2020 13:15   DG Lumbar Spine Complete  Result Date: 01/20/2020 CLINICAL DATA:  Back  pain. EXAM: LUMBAR SPINE - COMPLETE 4+ VIEW COMPARISON:  Lumbar MRI, 10/21/2006 FINDINGS: No fracture. No spondylolisthesis or and no bone lesion. There is dense material from prior vertebroplasty in the L3 vertebra. Minor loss of disc height at L2-L3-L3-L4. Mild loss of disc height at L4-L5. There are small endplate osteophytes. Mild facet degenerative change bilaterally at L4-L5 and L5-S1. Calcifications are scattered throughout the abdominal aorta. IMPRESSION: 1. No fracture or acute finding. 2. Previous L3 vertebroplasty. 3. Mild disc and facet degenerative changes as detailed. Electronically Signed   By: Amie Portland M.D.   On: 01/20/2020 13:14   DG Abd 1 View  Result Date: 01/27/2020 CLINICAL DATA:  Abdominal distension EXAM: ABDOMEN - 1 VIEW COMPARISON:  January 21, 2020 abdominal radiograph and CT abdomen and pelvis FINDINGS: Moderate stool in colon. No appreciable bowel dilatation or air-fluid level to suggest bowel obstruction. No free air. Probable phleboliths in the pelvis. Apparent prior vertebroplasty at L3. IMPRESSION: No bowel obstruction or free air evident on supine examination. Electronically Signed   By: Chrissie Noa  Margarita Grizzle III M.D.   On: 01/27/2020 10:40   CT HEAD WO CONTRAST  Result Date: 02/02/2020 CLINICAL DATA:  TIA EXAM: CT HEAD WITHOUT CONTRAST TECHNIQUE: Contiguous axial images were obtained from the base of the skull through the vertex without intravenous contrast. COMPARISON:  01/20/2020 FINDINGS: Brain: No acute infarct or hemorrhage. Stable hypodensities in the periventricular white matter compatible with chronic small vessel ischemic change. Lateral ventricles and midline structures are unremarkable. No acute extra-axial fluid collections. No mass effect. Vascular: No hyperdense vessel or unexpected calcification. Skull: Normal. Negative for fracture or focal lesion. Sinuses/Orbits: No acute finding. Other: None. IMPRESSION: 1. Stable head CT, no acute process.  Electronically Signed   By: Sharlet Salina M.D.   On: 02/02/2020 16:16   CT Head Wo Contrast  Result Date: 01/20/2020 CLINICAL DATA:  Head trauma, minor. Additional history provided: Fall. EXAM: CT HEAD WITHOUT CONTRAST CT CERVICAL SPINE WITHOUT CONTRAST TECHNIQUE: Multidetector CT imaging of the head and cervical spine was performed following the standard protocol without intravenous contrast. Multiplanar CT image reconstructions of the cervical spine were also generated. COMPARISON:  Head CT 08/17/2017.  Brain MRI 02/28/2016. FINDINGS: CT HEAD FINDINGS Brain: The examination is motion degraded at the level of the lower cerebrum, skull base, orbits, maxillofacial structures and posterior fossa. Cerebral and cerebellar atrophy. Mild ill-defined hypoattenuation within the cerebral white matter is nonspecific, but compatible with chronic small vessel ischemic disease. There is no acute intracranial hemorrhage. No demarcated cortical infarct. No extra-axial fluid collection. No evidence of intracranial mass. No midline shift. Vascular: No hyperdense vessel.  Atherosclerotic calcifications. Skull: Normal. Negative for fracture or focal lesion. Sinuses/Orbits: Visualized orbits show no acute finding. No significant paranasal sinus disease at the imaged levels. CT CERVICAL SPINE FINDINGS Alignment: Straightening of the expected cervical lordosis. Trace C3-C4 grade 1 retrolisthesis. Skull base and vertebrae: The basion-dental and atlanto-dental intervals are maintained.No evidence of acute fracture to the cervical spine. Soft tissues and spinal canal: No prevertebral fluid or swelling. No visible canal hematoma. Disc levels: Cervical spondylosis with multilevel disc space narrowing, disc bulges and uncovertebral hypertrophy. Disc space narrowing is advanced at C3-C4, C4-C5, C5-C6 and C6-C7. Prominent multilevel ventrolateral osteophytes, most notably at C7-T1. Upper chest: No visible airspace consolidation or  pneumothorax. Other: Atherosclerotic calcifications within the visualized aortic arch, proximal major branch vessels of the neck and carotid bifurcations. IMPRESSION: CT head: 1. Motion degraded examination as described. 2. No acute intracranial abnormality is identified. 3. Cerebral and cerebellar atrophy with mild chronic small vessel ischemic disease. CT cervical spine: 1. No evidence of acute fracture to the cervical spine. 2. Mild C3-C4 grade 1 retrolisthesis. 3. Cervical spondylosis as described. 4.  Aortic Atherosclerosis (ICD10-I70.0). Electronically Signed   By: Jackey Loge DO   On: 01/20/2020 13:27   CT Cervical Spine Wo Contrast  Result Date: 01/20/2020 CLINICAL DATA:  Head trauma, minor. Additional history provided: Fall. EXAM: CT HEAD WITHOUT CONTRAST CT CERVICAL SPINE WITHOUT CONTRAST TECHNIQUE: Multidetector CT imaging of the head and cervical spine was performed following the standard protocol without intravenous contrast. Multiplanar CT image reconstructions of the cervical spine were also generated. COMPARISON:  Head CT 08/17/2017.  Brain MRI 02/28/2016. FINDINGS: CT HEAD FINDINGS Brain: The examination is motion degraded at the level of the lower cerebrum, skull base, orbits, maxillofacial structures and posterior fossa. Cerebral and cerebellar atrophy. Mild ill-defined hypoattenuation within the cerebral white matter is nonspecific, but compatible with chronic small vessel ischemic disease. There is no acute intracranial hemorrhage.  No demarcated cortical infarct. No extra-axial fluid collection. No evidence of intracranial mass. No midline shift. Vascular: No hyperdense vessel.  Atherosclerotic calcifications. Skull: Normal. Negative for fracture or focal lesion. Sinuses/Orbits: Visualized orbits show no acute finding. No significant paranasal sinus disease at the imaged levels. CT CERVICAL SPINE FINDINGS Alignment: Straightening of the expected cervical lordosis. Trace C3-C4 grade 1  retrolisthesis. Skull base and vertebrae: The basion-dental and atlanto-dental intervals are maintained.No evidence of acute fracture to the cervical spine. Soft tissues and spinal canal: No prevertebral fluid or swelling. No visible canal hematoma. Disc levels: Cervical spondylosis with multilevel disc space narrowing, disc bulges and uncovertebral hypertrophy. Disc space narrowing is advanced at C3-C4, C4-C5, C5-C6 and C6-C7. Prominent multilevel ventrolateral osteophytes, most notably at C7-T1. Upper chest: No visible airspace consolidation or pneumothorax. Other: Atherosclerotic calcifications within the visualized aortic arch, proximal major branch vessels of the neck and carotid bifurcations. IMPRESSION: CT head: 1. Motion degraded examination as described. 2. No acute intracranial abnormality is identified. 3. Cerebral and cerebellar atrophy with mild chronic small vessel ischemic disease. CT cervical spine: 1. No evidence of acute fracture to the cervical spine. 2. Mild C3-C4 grade 1 retrolisthesis. 3. Cervical spondylosis as described. 4.  Aortic Atherosclerosis (ICD10-I70.0). Electronically Signed   By: Jackey Loge DO   On: 01/20/2020 13:27   CT ABDOMEN PELVIS W CONTRAST  Result Date: 01/21/2020 CLINICAL DATA:  Abdominal distention EXAM: CT ABDOMEN AND PELVIS WITH CONTRAST TECHNIQUE: Multidetector CT imaging of the abdomen and pelvis was performed using the standard protocol following bolus administration of intravenous contrast. CONTRAST:  OMNIPAQUE IOHEXOL 300 MG/ML  SOLN COMPARISON:  None. FINDINGS: Lower chest: Calcifications in the visualized coronary arteries and descending thoracic aorta. Heart is normal size. No acute abnormality. Hepatobiliary: No focal hepatic abnormality. Gallbladder unremarkable. Pancreas: No focal abnormality or ductal dilatation. Spleen: No focal abnormality.  Normal size. Adrenals/Urinary Tract: 2.4 cm cyst in the midpole of the left kidney. No hydronephrosis.  Small cyst in the midpole of the right kidney. Adrenal glands and urinary bladder unremarkable. Stomach/Bowel: Normal appendix. Extensive colonic diverticulosis in the descending colon and sigmoid colon. No active diverticulitis. Colon is distended with stool and fluid. Stomach and small bowel decompressed, unremarkable. Vascular/Lymphatic: Heavily calcified aorta and iliac vessels. No evidence of aneurysm or adenopathy. Reproductive: Prostate enlargement. Other: No free fluid or free air. Small left inguinal hernia containing fat. Musculoskeletal: No acute bony abnormality. Prior vertebroplasty changes at L3. IMPRESSION: Extensive left colonic diverticulosis.  No active diverticulitis. Gaseous distension of the colon with layering fluid and moderate stool. This could reflect colonic ileus. Normal appendix. Aortic atherosclerosis.  Coronary artery disease. Prostate enlargement. Left inguinal hernia containing fat. Electronically Signed   By: Charlett Nose M.D.   On: 01/21/2020 17:19   DG Chest Port 1 View  Result Date: 02/02/2020 CLINICAL DATA:  Respiratory distress EXAM: PORTABLE CHEST 1 VIEW COMPARISON:  January 29, 2020 FINDINGS: Lungs are clear. Heart size and pulmonary vascularity are within normal limits. No adenopathy. There is aortic atherosclerosis. No bone lesions. IMPRESSION: No edema or airspace opacity. Heart size within normal limits. Aortic Atherosclerosis (ICD10-I70.0). Electronically Signed   By: Bretta Bang III M.D.   On: 02/02/2020 10:36   DG Chest Port 1 View  Result Date: 01/29/2020 CLINICAL DATA:  Shortness of breath. EXAM: PORTABLE CHEST 1 VIEW COMPARISON:  Chest x-ray 01/20/2020. FINDINGS: Mediastinum and hilar structures normal. Heart size normal. Low lung volumes with mild bibasilar atelectasis. Tiny left pleural effusion  cannot be excluded. No pneumothorax. Degenerative change thoracic spine. IMPRESSION: Low lung volumes with mild bibasilar atelectasis. Tiny left pleural  effusion cannot be excluded. Electronically Signed   By: Maisie Fus  Register   On: 01/29/2020 13:38   DG Abd Portable 1V  Result Date: 01/21/2020 CLINICAL DATA:  Rigid abdomen. EXAM: PORTABLE ABDOMEN - 1 VIEW COMPARISON:  11/13/2006. FINDINGS: Several loops of slightly prominent small bowel noted. Distention of the right and transverse colon up to 8 cm noted although adynamic ileus could present this fashion. Follow-up exam suggested demonstrate resolution to exclude bowel obstruction. No free air identified. Degenerative change lumbar spine. Pelvic calcifications consistent phleboliths. IMPRESSION: Several loops of slightly prominent small bowel noted. Distention of the right and transverse colon up to 8 cm noted. Although adynamic ileus could present in this fashion. Follow-up exam suggested to demonstrate resolution to exclude bowel obstruction. Electronically Signed   By: Maisie Fus  Register   On: 01/21/2020 12:22       Subjective: Weaned of oxygen.  Discharge Exam: Vitals:   02/05/20 0807 02/05/20 1034  BP: 139/70   Pulse: 75   Resp: 16   Temp: 98.3 F (36.8 C)   SpO2: 100% 94%   Vitals:   02/05/20 0005 02/05/20 0338 02/05/20 0807 02/05/20 1034  BP: (!) 120/54 140/71 139/70   Pulse: 91 80 75   Resp: 18 18 16    Temp: 98.4 F (36.9 C) 97.8 F (36.6 C) 98.3 F (36.8 C)   TempSrc: Oral  Axillary   SpO2: 94% 99% 100% 94%  Weight:        General: Pt is alert, awake, not in acute distress Cardiovascular: RRR, S1/S2 +, no rubs, no gallops Respiratory: CTA bilaterally, no wheezing, no rhonchi Abdominal: Soft, NT, ND, bowel sounds + Extremities: no edema, no cyanosis    The results of significant diagnostics from this hospitalization (including imaging, microbiology, ancillary and laboratory) are listed below for reference.     Microbiology: Recent Results (from the past 240 hour(s))  Respiratory Panel by RT PCR (Flu A&B, Covid) - Nasopharyngeal Swab     Status: None    Collection Time: 01/27/20 11:54 AM   Specimen: Nasopharyngeal Swab; Nasopharyngeal(NP) swabs in vial transport medium  Result Value Ref Range Status   SARS Coronavirus 2 by RT PCR NEGATIVE NEGATIVE Final    Comment: (NOTE) SARS-CoV-2 target nucleic acids are NOT DETECTED.  The SARS-CoV-2 RNA is generally detectable in upper respiratoy specimens during the acute phase of infection. The lowest concentration of SARS-CoV-2 viral copies this assay can detect is 131 copies/mL. A negative result does not preclude SARS-Cov-2 infection and should not be used as the sole basis for treatment or other patient management decisions. A negative result may occur with  improper specimen collection/handling, submission of specimen other than nasopharyngeal swab, presence of viral mutation(s) within the areas targeted by this assay, and inadequate number of viral copies (<131 copies/mL). A negative result must be combined with clinical observations, patient history, and epidemiological information. The expected result is Negative.  Fact Sheet for Patients:  https://www.moore.com/  Fact Sheet for Healthcare Providers:  https://www.young.biz/  This test is no t yet approved or cleared by the [place] FDA and  has been authorized for detection and/or diagnosis of SARS-CoV-2 by FDA under an Emergency Use Authorization (EUA). This EUA will remain  in effect (meaning this test can be used) for the duration of the COVID-19 declaration under Section 564(b)(1) of the Act, 21 U.S.C. section 360bbb-3(b)(1), unless  the authorization is terminated or revoked sooner.     Influenza A by PCR NEGATIVE NEGATIVE Final   Influenza B by PCR NEGATIVE NEGATIVE Final    Comment: (NOTE) The Xpert Xpress SARS-CoV-2/FLU/RSV assay is intended as an aid in  the diagnosis of influenza from Nasopharyngeal swab specimens and  should not be used as a sole basis for treatment. Nasal  washings and  aspirates are unacceptable for Xpert Xpress SARS-CoV-2/FLU/RSV  testing.  Fact Sheet for Patients: https://www.moore.com/  Fact Sheet for Healthcare Providers: https://www.young.biz/  This test is not yet approved or cleared by the [place] FDA and  has been authorized for detection and/or diagnosis of SARS-CoV-2 by  FDA under an Emergency Use Authorization (EUA). This EUA will remain  in effect (meaning this test can be used) for the duration of the  Covid-19 declaration under Section 564(b)(1) of the Act, 21  U.S.C. section 360bbb-3(b)(1), unless the authorization is  terminated or revoked. Performed at [facility], [address], [place], [place] [address]   Resp Panel by RT-PCR (Flu A&B, Covid) Nasopharyngeal Swab     Status: None   Collection Time: 01/29/20  4:47 PM   Specimen: Nasopharyngeal Swab; Nasopharyngeal(NP) swabs in vial transport medium  Result Value Ref Range Status   SARS Coronavirus 2 by RT PCR NEGATIVE NEGATIVE Final    Comment: (NOTE) SARS-CoV-2 target nucleic acids are NOT DETECTED.  The SARS-CoV-2 RNA is generally detectable in upper respiratory specimens during the acute phase of infection. The lowest concentration of SARS-CoV-2 viral copies this assay can detect is 138 copies/mL. A negative result does not preclude SARS-Cov-2 infection and should not be used as the sole basis for treatment or other patient management decisions. A negative result may occur with  improper specimen collection/handling, submission of specimen other than nasopharyngeal swab, presence of viral mutation(s) within the areas targeted by this assay, and inadequate number of viral copies(<138 copies/mL). A negative result must be combined with clinical observations, patient history, and epidemiological information. The expected result is Negative.  Fact Sheet for Patients:   BloggerCourse.com  Fact Sheet for Healthcare Providers:  SeriousBroker.it  This test is no t yet approved or cleared by the [place] FDA and  has been authorized for detection and/or diagnosis of SARS-CoV-2 by FDA under an Emergency Use Authorization (EUA). This EUA will remain  in effect (meaning this test can be used) for the duration of the COVID-19 declaration under Section 564(b)(1) of the Act, 21 U.S.C.section 360bbb-3(b)(1), unless the authorization is terminated  or revoked sooner.       Influenza A by PCR NEGATIVE NEGATIVE Final   Influenza B by PCR NEGATIVE NEGATIVE Final    Comment: (NOTE) The Xpert Xpress SARS-CoV-2/FLU/RSV plus assay is intended as an aid in the diagnosis of influenza from Nasopharyngeal swab specimens and should not be used as a sole basis for treatment. Nasal washings and aspirates are unacceptable for Xpert Xpress SARS-CoV-2/FLU/RSV testing.  Fact Sheet for Patients: BloggerCourse.com  Fact Sheet for Healthcare Providers: SeriousBroker.it  This test is not yet approved or cleared by the [place] FDA and has been authorized for detection and/or diagnosis of SARS-CoV-2 by FDA under an Emergency Use Authorization (EUA). This EUA will remain in effect (meaning this test can be used) for the duration of the COVID-19 declaration under Section 564(b)(1) of the Act, 21 U.S.C. section 360bbb-3(b)(1), unless the authorization is terminated or revoked.  Performed at [facility], [address] [place], [place] [address]   Culture, blood (routine x 2)     Status: None (Preliminary result)   Collection Time: 02/02/20 11:41 AM   Specimen: BLOOD RIGHT ARM  Result Value Ref Range Status   Specimen Description BLOOD RIGHT ARM  Final   Special Requests   Final    BOTTLES DRAWN AEROBIC AND ANAEROBIC Blood Culture adequate volume   Culture    Final    NO GROWTH 3 DAYS Performed at [facility], [address] [place], [place] [address]    Report Status PENDING  Incomplete  Culture, blood (routine x 2)     Status: None (Preliminary result)   Collection Time: 02/02/20 11:42 AM   Specimen: BLOOD RIGHT HAND  Result Value Ref Range Status   Specimen Description BLOOD RIGHT HAND  Final   Special Requests   Final    BOTTLES DRAWN AEROBIC AND ANAEROBIC Blood Culture adequate volume   Culture   Final    NO GROWTH 3 DAYS Performed at [facility], [address] [place], [place] [address]    Report Status PENDING  Incomplete     Labs: BNP (last 3 results) Recent Labs    01/29/20 0401  BNP 67.8   Basic Metabolic Panel: Recent Labs  Lab 01/30/20 0327 01/30/20 0327 01/31/20 0331 01/31/20 0331 02/01/20 0516 02/02/20 0630 02/03/20 0438 02/04/20 0442 02/05/20 0327  NA 141   < > 140   < > 138 140 140 141 142  K 3.1*   < > 4.1   < > 3.4* 5.2* 3.5 3.6 3.9  CL 98   < > 94*   < > 92* 91* 94* 96* 99  CO2 34*   < > 32   < > 35* 36* 33* 32 31  GLUCOSE 117*   < > 123*   < > 120* 114* 119* 118* 110*  BUN 15   < > 11   < > 15 20 32* 35* 28*  CREATININE 1.06   < > 1.01   < > 1.35* 1.39* 1.67* 1.39* 1.33*  CALCIUM 8.6*   < > 8.8*   < > 8.4* 9.0 8.3* 8.3* 8.2*  MG 1.9  --  1.9  --  1.8  --   --   --   --    < > = values in this interval not displayed.   Liver Function Tests: No results for input(s): AST, ALT, ALKPHOS, BILITOT, PROT, ALBUMIN in the last 168 hours. No results for input(s): LIPASE, AMYLASE in the last 168 hours. No results for input(s): AMMONIA in the last 168 hours. CBC: Recent Labs  Lab 02/01/20 0516 02/02/20 0630 02/03/20 0438 02/04/20 0442 02/05/20 0327  WBC 18.7* 10.2 40.8* 22.4* 20.0*  HGB 12.6* 14.6 12.2* 12.6* 12.0*  HCT 39.5 46.6 38.1* 39.1 38.5*  MCV 97.1 97.5 97.2 98.0 99.7  PLT 286 263 221 196 204   Cardiac Enzymes: No results for input(s): CKTOTAL, CKMB, CKMBINDEX, TROPONINI  in the last 168 hours. BNP: Invalid input(s): POCBNP CBG: Recent Labs  Lab 02/02/20 0933  GLUCAP 94   D-Dimer No results for input(s): DDIMER in the last 72 hours. Hgb A1c No results for input(s): HGBA1C in the last 72 hours. Lipid Profile No results for input(s): CHOL, HDL, LDLCALC, TRIG, CHOLHDL, LDLDIRECT in the last 72 hours. Thyroid function studies No results for input(s): TSH, T4TOTAL, T3FREE, THYROIDAB in the last 72 hours.  Invalid input(s): FREET3 Anemia work up  No results for input(s): VITAMINB12, FOLATE, FERRITIN, TIBC, IRON, RETICCTPCT in the last 72 hours. Urinalysis    Component Value Date/Time   COLORURINE YELLOW 02/04/2020 0728   APPEARANCEUR HAZY (A) 02/04/2020 0728   LABSPEC 1.023 02/04/2020 0728   PHURINE 5.0 02/04/2020 0728   GLUCOSEU NEGATIVE 02/04/2020 0728   HGBUR MODERATE (A) 02/04/2020 0728   BILIRUBINUR NEGATIVE 02/04/2020 0728   KETONESUR 5 (A) 02/04/2020 0728   PROTEINUR 30 (A) 02/04/2020 0728   UROBILINOGEN 0.2 11/09/2006 2014   NITRITE NEGATIVE 02/04/2020 0728   LEUKOCYTESUR LARGE (A) 02/04/2020 0728   Sepsis Labs Invalid input(s): PROCALCITONIN,  WBC,  LACTICIDVEN Microbiology Recent Results (from the past 240 hour(s))  Respiratory Panel by RT PCR (Flu A&B, Covid) - Nasopharyngeal Swab     Status: None   Collection Time: 01/27/20 11:54 AM   Specimen: Nasopharyngeal Swab; Nasopharyngeal(NP) swabs in vial transport medium  Result Value Ref Range Status   SARS Coronavirus 2 by RT PCR NEGATIVE NEGATIVE Final    Comment: (NOTE) SARS-CoV-2 target nucleic acids are NOT DETECTED.  The SARS-CoV-2 RNA is generally detectable in upper respiratoy specimens during the acute phase of infection. The lowest concentration of SARS-CoV-2 viral copies this assay can detect is 131 copies/mL. A negative result does not preclude SARS-Cov-2 infection and should not be used as the sole basis for treatment or other patient management decisions. A negative  result may occur with  improper specimen collection/handling, submission of specimen other than nasopharyngeal swab, presence of viral mutation(s) within the areas targeted by this assay, and inadequate number of viral copies (<131 copies/mL). A negative result must be combined with clinical observations, patient history, and epidemiological information. The expected result is Negative.  Fact Sheet for Patients:  https://www.moore.com/https://www.fda.gov/media/142436/download  Fact Sheet for Healthcare Providers:  https://www.young.biz/https://www.fda.gov/media/142435/download  This test is no t yet approved or cleared by the [place] FDA and  has been authorized for detection and/or diagnosis of SARS-CoV-2 by FDA under an Emergency Use Authorization (EUA). This EUA will remain  in effect (meaning this test can be used) for the duration of the COVID-19 declaration under Section 564(b)(1) of the Act, 21 U.S.C. section 360bbb-3(b)(1), unless the authorization is terminated or revoked sooner.     Influenza A by PCR NEGATIVE NEGATIVE Final   Influenza B by PCR NEGATIVE NEGATIVE Final    Comment: (NOTE) The Xpert Xpress SARS-CoV-2/FLU/RSV assay is intended as an aid in  the diagnosis of influenza from Nasopharyngeal swab specimens and  should not be used as a sole basis for treatment. Nasal washings and  aspirates are unacceptable for Xpert Xpress SARS-CoV-2/FLU/RSV  testing.  Fact Sheet for Patients: https://www.moore.com/https://www.fda.gov/media/142436/download  Fact Sheet for Healthcare Providers: https://www.young.biz/https://www.fda.gov/media/142435/download  This test is not yet approved or cleared by the [place] FDA and  has been authorized for detection and/or diagnosis of SARS-CoV-2 by  FDA under an Emergency Use Authorization (EUA). This EUA will remain  in effect (meaning this test can be used) for the duration of the  Covid-19 declaration under Section 564(b)(1) of the Act, 21  U.S.C. section 360bbb-3(b)(1), unless the authorization is   terminated or revoked. Performed at [facility], [address] [place], [place] [address]   Resp Panel by RT-PCR (Flu A&B, Covid) Nasopharyngeal Swab     Status: None   Collection Time: 01/29/20  4:47 PM   Specimen: Nasopharyngeal Swab; Nasopharyngeal(NP) swabs in vial transport medium  Result Value Ref Range Status   SARS Coronavirus 2 by  RT PCR NEGATIVE NEGATIVE Final    Comment: (NOTE) SARS-CoV-2 target nucleic acids are NOT DETECTED.  The SARS-CoV-2 RNA is generally detectable in upper respiratory specimens during the acute phase of infection. The lowest concentration of SARS-CoV-2 viral copies this assay can detect is 138 copies/mL. A negative result does not preclude SARS-Cov-2 infection and should not be used as the sole basis for treatment or other patient management decisions. A negative result may occur with  improper specimen collection/handling, submission of specimen other than nasopharyngeal swab, presence of viral mutation(s) within the areas targeted by this assay, and inadequate number of viral copies(<138 copies/mL). A negative result must be combined with clinical observations, patient history, and epidemiological information. The expected result is Negative.  Fact Sheet for Patients:  BloggerCourse.com  Fact Sheet for Healthcare Providers:  SeriousBroker.it  This test is no t yet approved or cleared by the [place] FDA and  has been authorized for detection and/or diagnosis of SARS-CoV-2 by FDA under an Emergency Use Authorization (EUA). This EUA will remain  in effect (meaning this test can be used) for the duration of the COVID-19 declaration under Section 564(b)(1) of the Act, 21 U.S.C.section 360bbb-3(b)(1), unless the authorization is terminated  or revoked sooner.       Influenza A by PCR NEGATIVE NEGATIVE Final   Influenza B by PCR NEGATIVE NEGATIVE Final    Comment: (NOTE) The  Xpert Xpress SARS-CoV-2/FLU/RSV plus assay is intended as an aid in the diagnosis of influenza from Nasopharyngeal swab specimens and should not be used as a sole basis for treatment. Nasal washings and aspirates are unacceptable for Xpert Xpress SARS-CoV-2/FLU/RSV testing.  Fact Sheet for Patients: BloggerCourse.com  Fact Sheet for Healthcare Providers: SeriousBroker.it  This test is not yet approved or cleared by the [place] FDA and has been authorized for detection and/or diagnosis of SARS-CoV-2 by FDA under an Emergency Use Authorization (EUA). This EUA will remain in effect (meaning this test can be used) for the duration of the COVID-19 declaration under Section 564(b)(1) of the Act, 21 U.S.C. section 360bbb-3(b)(1), unless the authorization is terminated or revoked.  Performed at [facility], [address], [place], [place] [address]   Culture, blood (routine x 2)     Status: None (Preliminary result)   Collection Time: 02/02/20 11:41 AM   Specimen: BLOOD RIGHT ARM  Result Value Ref Range Status   Specimen Description BLOOD RIGHT ARM  Final   Special Requests   Final    BOTTLES DRAWN AEROBIC AND ANAEROBIC Blood Culture adequate volume   Culture   Final    NO GROWTH 3 DAYS Performed at [facility], [address], [place], [place] [address]    Report Status PENDING  Incomplete  Culture, blood (routine x 2)     Status: None (Preliminary result)   Collection Time: 02/02/20 11:42 AM   Specimen: BLOOD RIGHT HAND  Result Value Ref Range Status   Specimen Description BLOOD RIGHT HAND  Final   Special Requests   Final    BOTTLES DRAWN AEROBIC AND ANAEROBIC Blood Culture adequate volume   Culture   Final    NO GROWTH 3 DAYS Performed at [facility], [address], [place], [place] [address]    Report Status PENDING  Incomplete     Time coordinating discharge:32 MINUTES   SIGNED:   Kathlen Mody, MD  [facility] 02/05/2020, 11:07 AM

## 2020-02-05 NOTE — Progress Notes (Signed)
Occupational Therapy Treatment Patient Details Name: Shaun Moore MRN: [id number] DOB: Aug 19, 1925 Today's Date: 02/05/2020    History of present illness 86 y.o. male who was brought to the ED on 01/20/2020 for multiple falls, altered mental status.  PMH significant for Alzheimer's dementia, BPH, hypertension, GERD, back pain and OA. Patient fell about a week ago and struck his head on the ground but refused to be evaluated at the time.  Over the last 1 week, he has become increasingly agitated, more confused and difficult to reorient.  His gait is unsteady and he has had multiple falls despite the use of RW. Pt fell on 11/17 and was brought to the ED due to LBP.   OT comments  Pt making minimal progress towards OT goals this session. Pt continues to present with decreased level of arousal, baseline cognitive deficits, impaired balance, decreased strength/ functional endurance impacting pts ability to complete BADLs. Pt asleep upon arrival needing Sylar multimodal cues for pt to arouse. Pt required total A +2 to transition to EOB, once EOB pt did state " scratch my back" however pt making no other efforts to verbalize needs during session.Pt able to pivot to recliner with Jakylan A +2. Continue to recommend SNF, will follow acutely per POC.    Follow Up Recommendations  SNF    Equipment Recommendations  None recommended by OT    Recommendations for Other Services      Precautions / Restrictions Precautions Precautions: Fall Restrictions Weight Bearing Restrictions: No       Mobility Bed Mobility Overal bed mobility: Needs Assistance Bed Mobility: Supine to Sit     Supine to sit: Total assist;+2 for physical assistance;HOB elevated     General bed mobility comments: pt made no attempt to assist with bed mobility needing total A +2 elevate trunk into sitting and scoot hips to EOB  Transfers Overall transfer level: Needs assistance Equipment used: Rolling walker (2 wheeled) Transfers:  Stand Pivot Transfers;Sit to/from Stand Sit to Stand: Jyden assist;+2 physical assistance Stand pivot transfers: Chet assist;+2 physical assistance       General transfer comment: pt requried Mj A +2 to stand from EOB with RW, pt preferring to pull on RW to stand needing assist to steady RW. Pt required Rendell A +2 to elevate trunk and bring hips into full extension. Pt able to pivot to recliner with Zackry A +2 needinf cues to initiate pivotal steps and steady and RW during transfer    Balance Overall balance assessment: Needs assistance Sitting-balance support: Feet supported;Bilateral upper extremity supported Sitting balance-[name] Scale: Poor Sitting balance - Comments: pt with moments of min guard to statically sit EOB btu required up to MOD A at times with BUEs supported   Standing balance support: Bilateral upper extremity supported Standing balance-[name] Scale: Poor Standing balance comment: MaxAx2 with UE support on RW to maintain standing balance due to strong posterior lean.                           ADL either performed or assessed with clinical judgement   ADL Overall ADL's : Needs assistance/impaired                         Toilet Transfer: Maximal assistance;+2 for physical assistance;+2 for safety/equipment;RW;Stand-pivot Toilet Transfer Details (indicate cue type and reason): Nazire +2 and 1 extra attempt to stand pivot to recliner  Functional mobility during ADLs: Maximal assistance;+2 for physical assistance;Rolling walker (stand pivot transfer to recliner) General ADL Comments: pt continues to present with decreased level of arousal, impaired strength/ balance and baseline cognitive impairments impacting pts ability to complete BADLs. pt only able to complete stand pivot transfer to recliner with Osa A+2     Vision       Perception     Praxis      Cognition Arousal/Alertness: Lethargic Behavior During Therapy: Flat affect Overall  Cognitive Status: Difficult to assess Area of Impairment: Attention;Following commands;Safety/judgement;Awareness;Problem solving                   Current Attention Level: Focused   Following Commands: Follows one step commands inconsistently;Follows one step commands with increased time Safety/Judgement: Decreased awareness of safety;Decreased awareness of deficits Awareness: Intellectual Problem Solving: Slow processing;Difficulty sequencing;Decreased initiation;Requires verbal cues;Requires tactile cues General Comments: pt required Joshuah multimodal stimulus to awaken and remained lethargic throuhgout session. once sitting EOB pt stating "rub my back" but other than that pt nodding during session but often inappropriately such as pt stating are you awake with pt noddng "no"        Exercises     Shoulder Instructions       General Comments      Pertinent Vitals/ Pain       Pain Assessment: Faces Faces Pain Scale: Hurts even more Pain Location: generalized with mobility Pain Descriptors / Indicators: Grimacing;Discomfort Pain Intervention(s): Limited activity within patient's tolerance;Monitored during session;Repositioned  Home Living                                          Prior Functioning/Environment              Frequency  Min 2X/week        Progress Toward Goals  OT Goals(current goals can now be found in the care plan section)  Progress towards OT goals: Not progressing toward goals - comment (decreased level of arousal)  Acute Rehab OT Goals Patient Stated Goal: to have his back scratched OT Goal Formulation: Patient unable to participate in goal setting Time For Goal Achievement: 02/18/20 Potential to Achieve Goals: Fair  Plan Discharge plan remains appropriate;Frequency remains appropriate    Co-evaluation                 AM-PAC OT "6 Clicks" Daily Activity     Outcome Measure   Help from another person eating  meals?: A Lot Help from another person taking care of personal grooming?: A Lot Help from another person toileting, which includes using toliet, bedpan, or urinal?: Total Help from another person bathing (including washing, rinsing, drying)?: Total Help from another person to put on and taking off regular upper body clothing?: Total Help from another person to put on and taking off regular lower body clothing?: Total 6 Click Score: 8    End of Session Equipment Utilized During Treatment: Gait belt;Rolling walker;Oxygen;Other (comment) (2L [place])  OT Visit Diagnosis: Other abnormalities of gait and mobility (R26.89);Muscle weakness (generalized) (M62.81);Other symptoms and signs involving cognitive function   Activity Tolerance Patient limited by lethargy   Patient Left in chair;with call bell/phone within reach;with chair alarm set   Nurse Communication Mobility status        Time: 9767-3419 OT Time Calculation (min): 13 min  Charges: OT General Charges $OT Visit: 1  Visit OT Treatments $Therapeutic Activity: 8-22 mins  Audery Amel., COTA/L Acute Rehabilitation Services [phone]    Angelina Pih 02/05/2020, 10:22 AM

## 2020-02-07 LAB — CULTURE, BLOOD (ROUTINE X 2)
Culture: NO GROWTH
Culture: NO GROWTH
Special Requests: ADEQUATE
Special Requests: ADEQUATE

## 2020-02-09 DIAGNOSIS — L89613 Pressure ulcer of right heel, stage 3: Secondary | ICD-10-CM | POA: Diagnosis not present

## 2020-02-10 ENCOUNTER — Other Ambulatory Visit: Payer: Self-pay | Admitting: Neurology

## 2020-03-05 DEATH — deceased

## 2020-07-19 ENCOUNTER — Telehealth: Payer: Medicare Other | Admitting: Neurology

## 2022-08-20 IMAGING — CT CT CERVICAL SPINE W/O CM
3 of 4 series · 12 of 33 positions shown, 14 images · non-contrast
Comparison: Head CT 08/17/2017.  Brain MRI 02/28/2016.

CLINICAL DATA: Head trauma, minor. Additional history provided:
Fall.

EXAM:
CT HEAD WITHOUT CONTRAST
CT CERVICAL SPINE WITHOUT CONTRAST
TECHNIQUE: Multidetector CT imaging of the head and cervical spine was
performed following the standard protocol without intravenous
contrast. Multiplanar CT image reconstructions of the cervical spine
were also generated.

[Series 8: coronal bone · coronal · 0.23mm/px · 3 of 106 slices shown]
[im 31/106  bone]
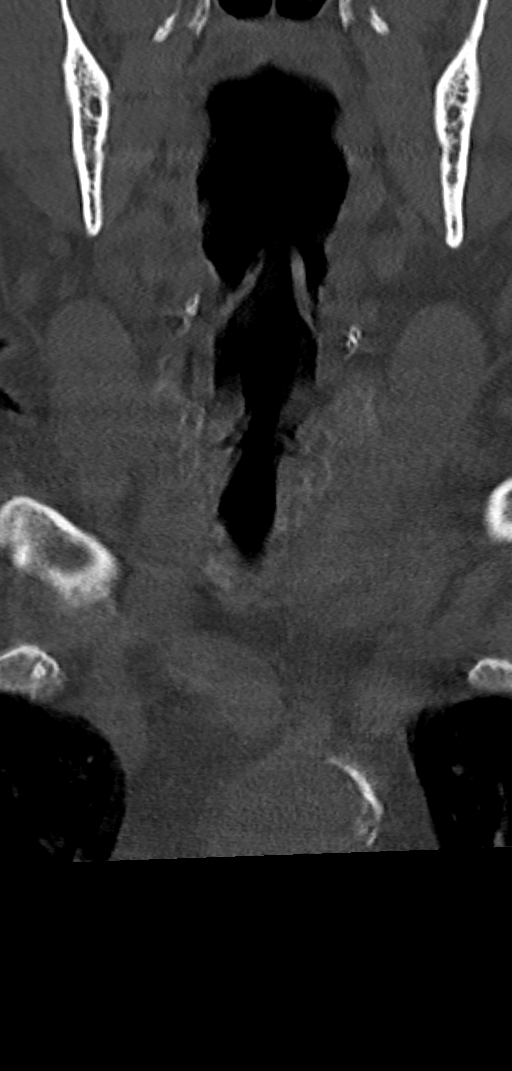
[im 46/106  bone]
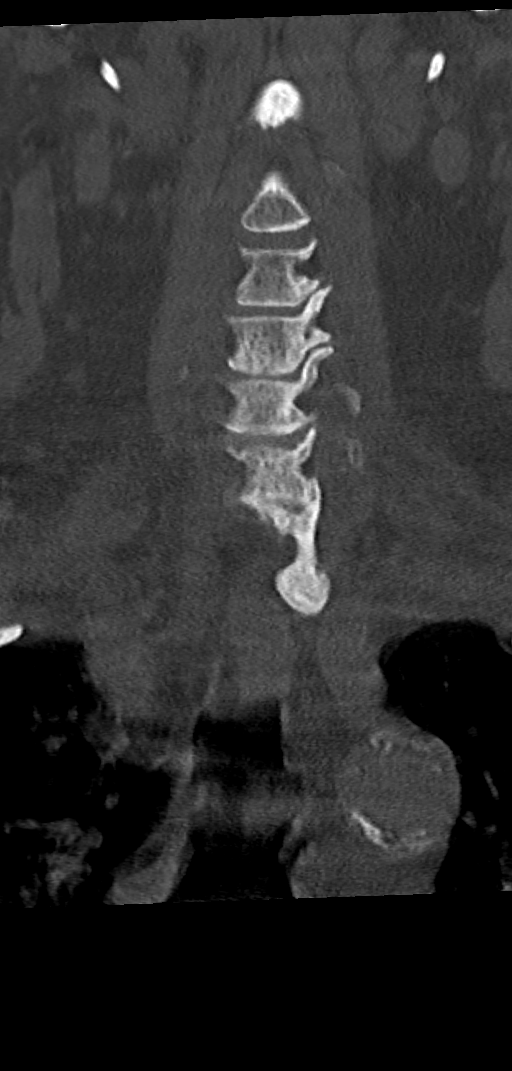
[im 61/106  bone]
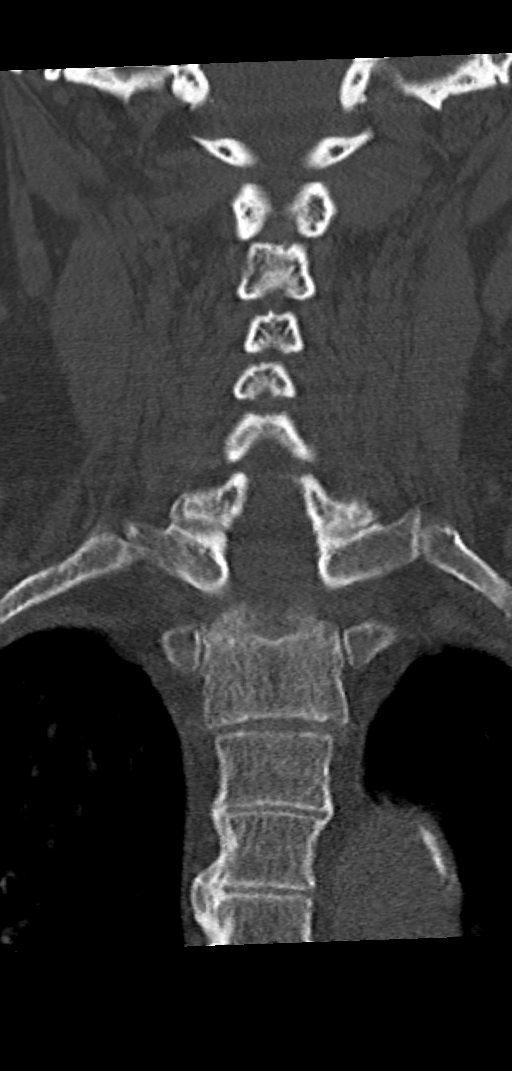

[Series 9: sagittal bone · sagittal · 0.36mm/px · 5 of 61 slices shown, 6 images]
[im 21/61  bone]
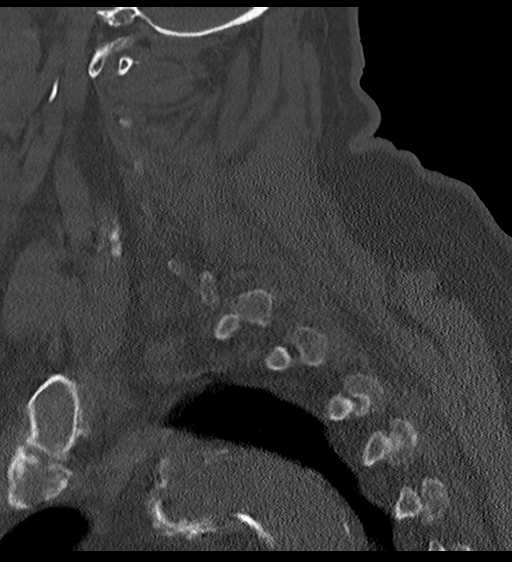
[im 26/61  bone]
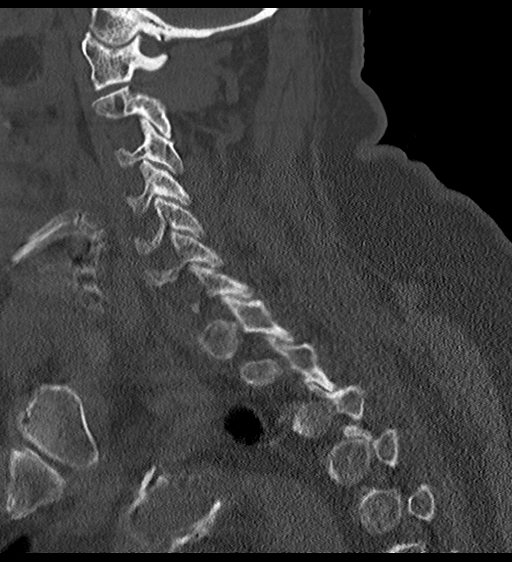
[im 31/61  soft-tissue]
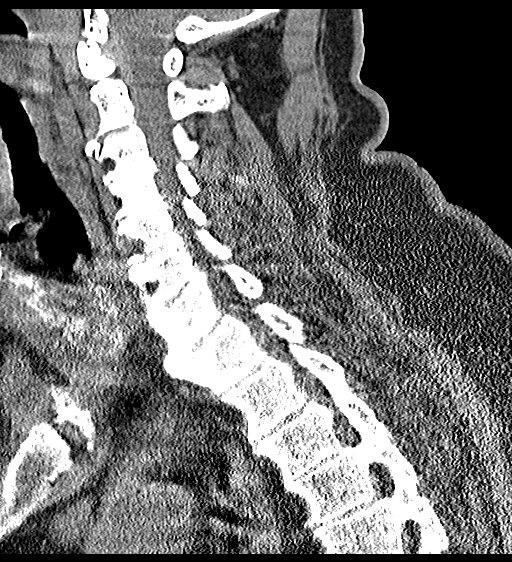
[im 31/61  bone]
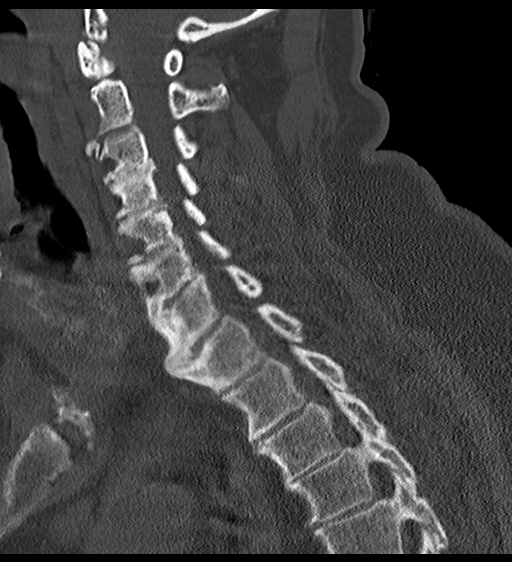
[im 36/61  bone]
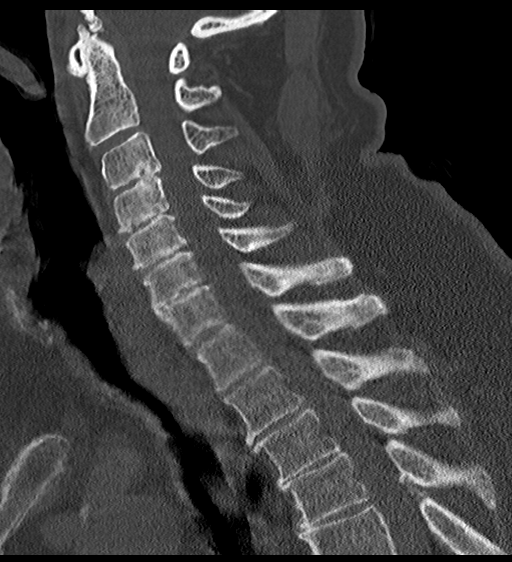
[im 41/61  bone]
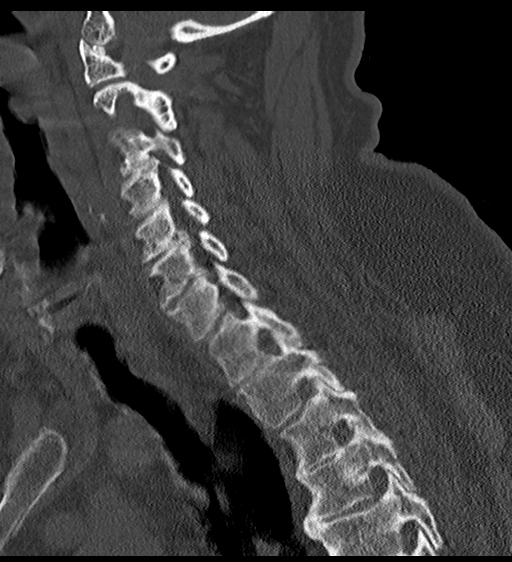

[Series 11: orthogonal axial st · axial · 0.21mm/px · z∈[-295,-157]mm · 4 of 116 slices shown, 5 images]
[im 17/116  soft-tissue]
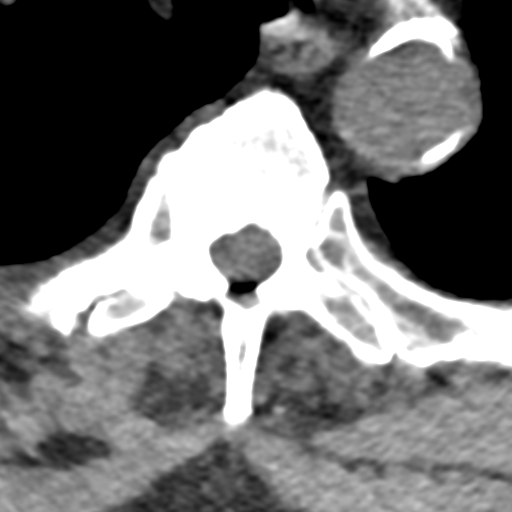
[im 17/116  bone]
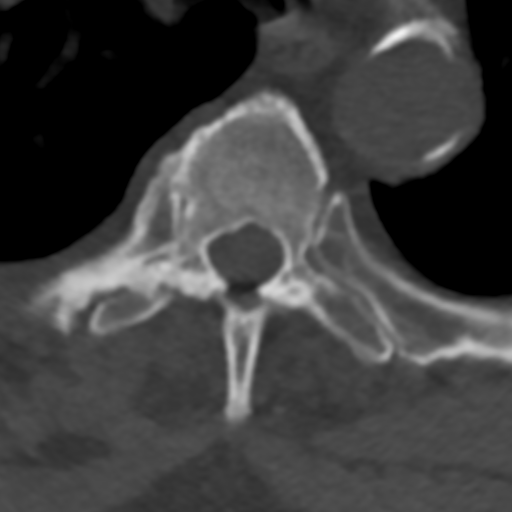
[im 50/116  bone]
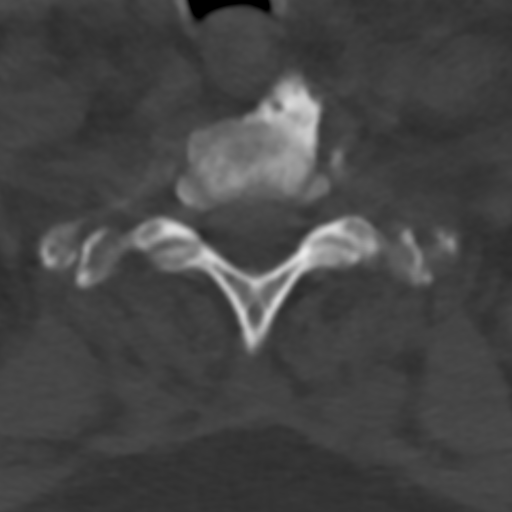
[im 66/116  bone]
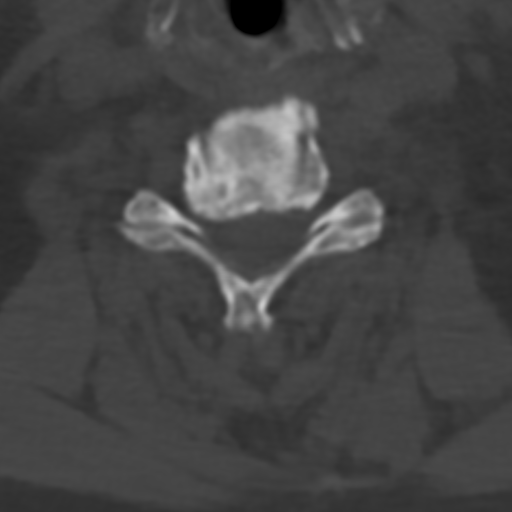
[im 99/116  bone]
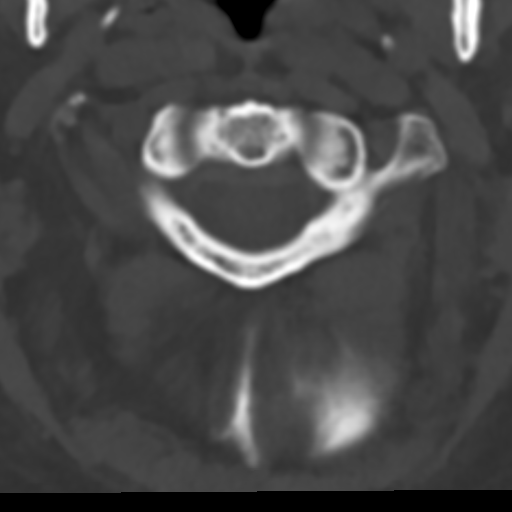

[12 of 33 positions shown; findings below may reference images not displayed]

FINDINGS: CT HEAD FINDINGS

Brain:

The examination is motion degraded at the level of the lower
cerebrum, skull base, orbits, maxillofacial structures and posterior
fossa.

Cerebral and cerebellar atrophy.

Mild ill-defined hypoattenuation within the cerebral white matter is
nonspecific, but compatible with chronic small vessel ischemic
disease.

There is no acute intracranial hemorrhage.

No demarcated cortical infarct.

No extra-axial fluid collection.

No evidence of intracranial mass.

No midline shift.

Vascular: No hyperdense vessel.  Atherosclerotic calcifications.

Skull: Normal. Negative for fracture or focal lesion.

Sinuses/Orbits: Visualized orbits show no acute finding. No
significant paranasal sinus disease at the imaged levels.

CT CERVICAL SPINE FINDINGS

Alignment: Straightening of the expected cervical lordosis. Trace
C3-C4 grade 1 retrolisthesis.

Skull base and vertebrae: The basion-dental and atlanto-dental
intervals are maintained.No evidence of acute fracture to the
cervical spine.

Soft tissues and spinal canal: No prevertebral fluid or swelling. No
visible canal hematoma.

Disc levels: Cervical spondylosis with multilevel disc space
narrowing, disc bulges and uncovertebral hypertrophy. Disc space
narrowing is advanced at C3-C4, C4-C5, C5-C6 and C6-C7. Prominent
multilevel ventrolateral osteophytes, most notably at C7-T1.

Upper chest: No visible airspace consolidation or pneumothorax.

Other: Atherosclerotic calcifications within the visualized aortic
arch, proximal major branch vessels of the neck and carotid
bifurcations.
IMPRESSION: CT head:

1. Motion degraded examination as described.
2. No acute intracranial abnormality is identified.
3. Cerebral and cerebellar atrophy with mild chronic small vessel
ischemic disease.

CT cervical spine:

1. No evidence of acute fracture to the cervical spine.
2. Mild C3-C4 grade 1 retrolisthesis.
3. Cervical spondylosis as described.
4.  Aortic Atherosclerosis ([id number]).

## 2022-08-21 IMAGING — DX DG ABD PORTABLE 1V
1 series · 4 of 4 positions shown · non-contrast
Comparison: 11/13/2006.

CLINICAL DATA: Rigid abdomen.

EXAM:
PORTABLE ABDOMEN - 1 VIEW

[Series 1: abdomen · 0.14mm/px · 4 of 4 slices shown]
[im 1/4]
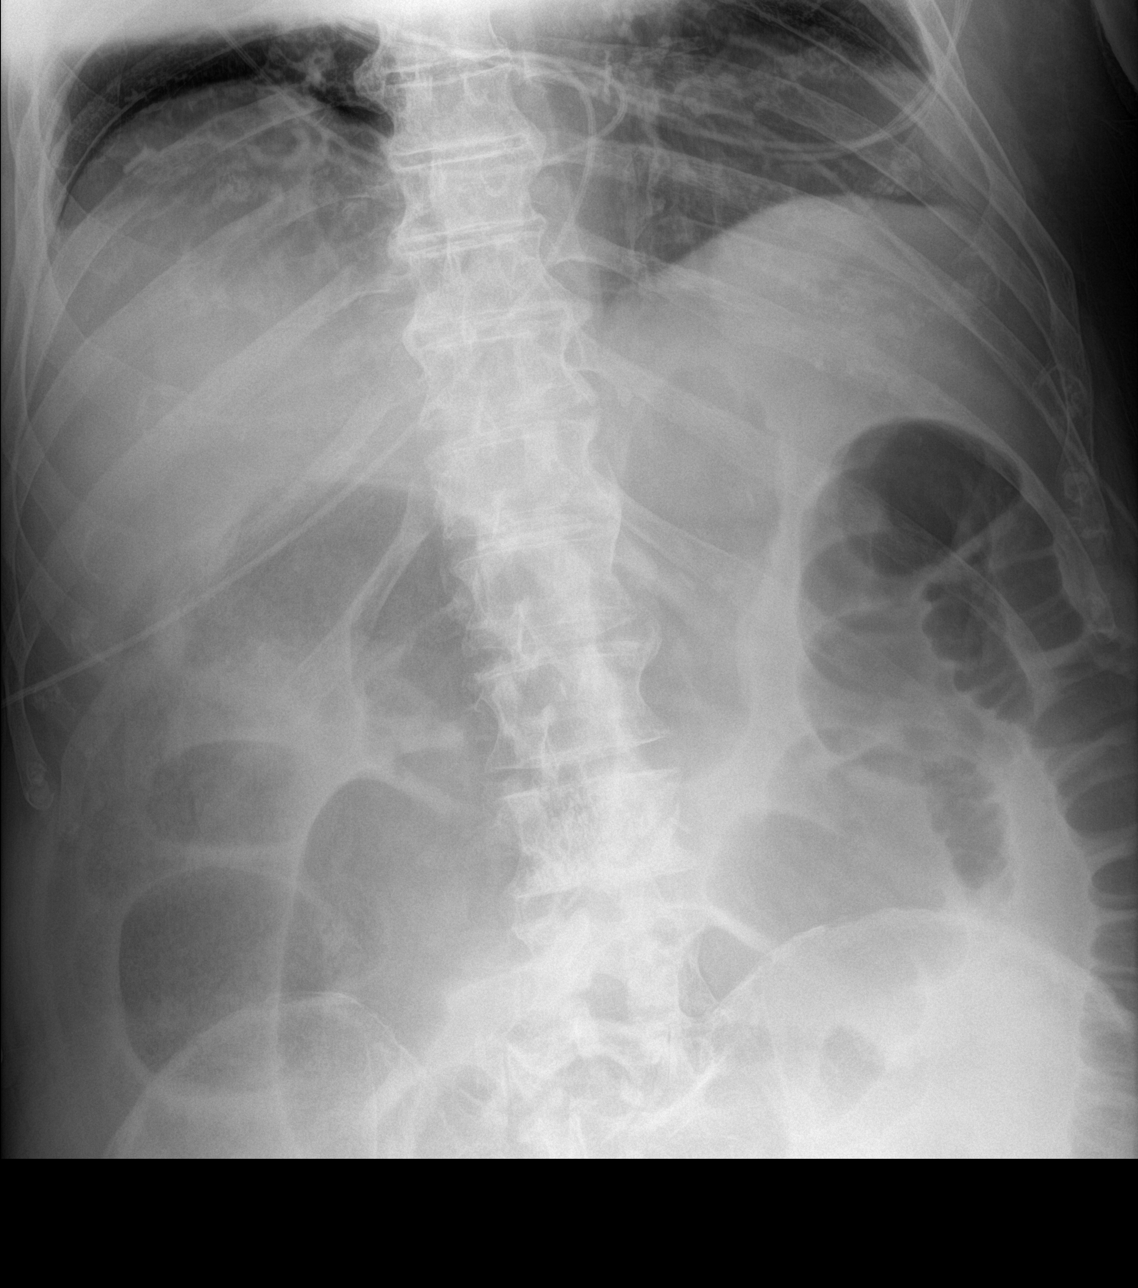
[im 2/4]
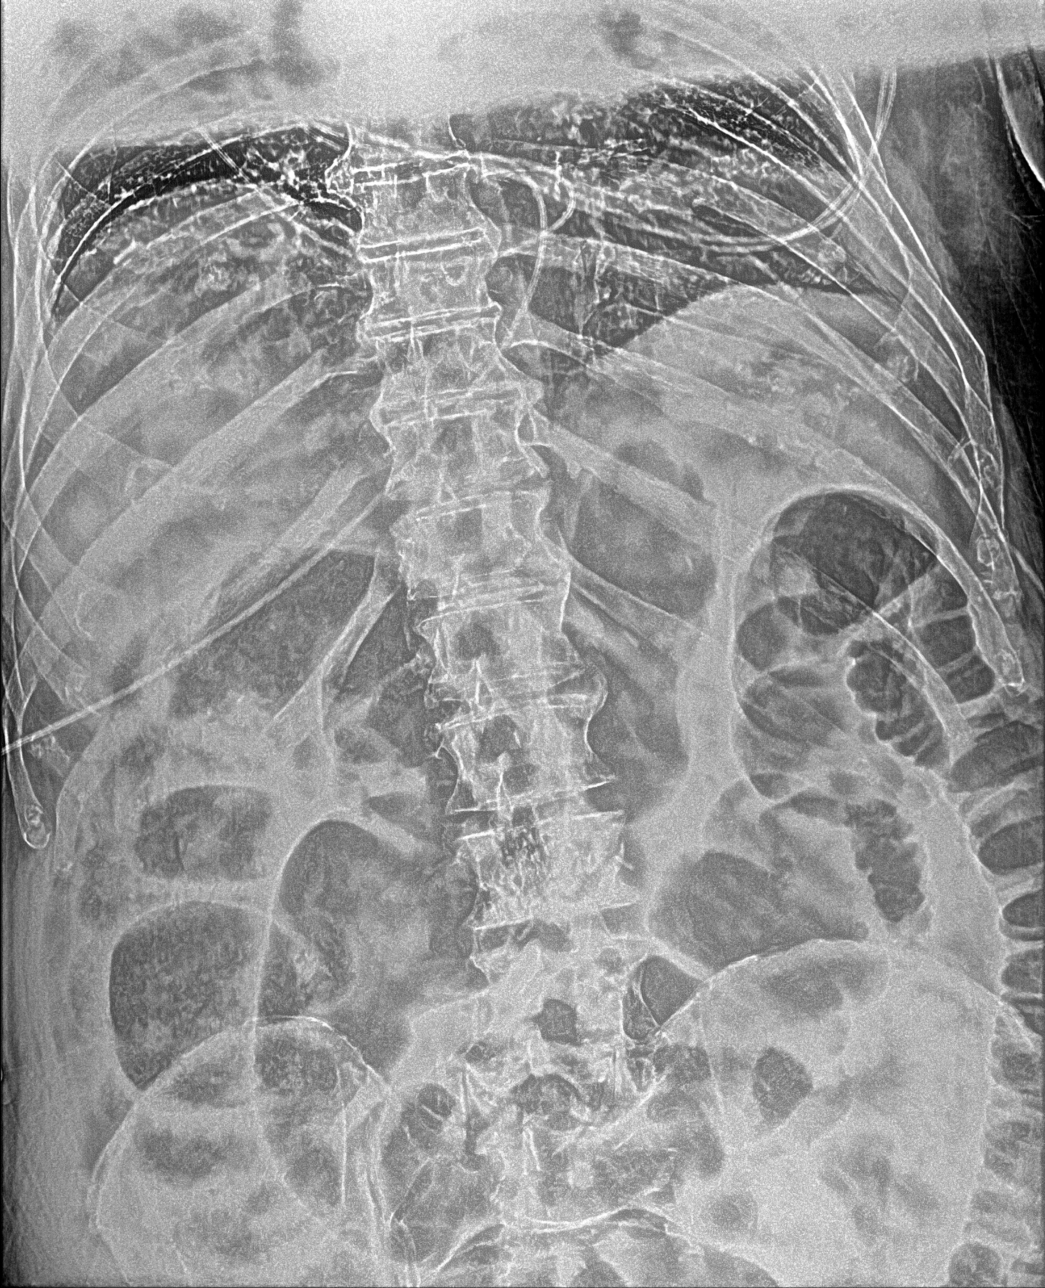
[im 3/4]
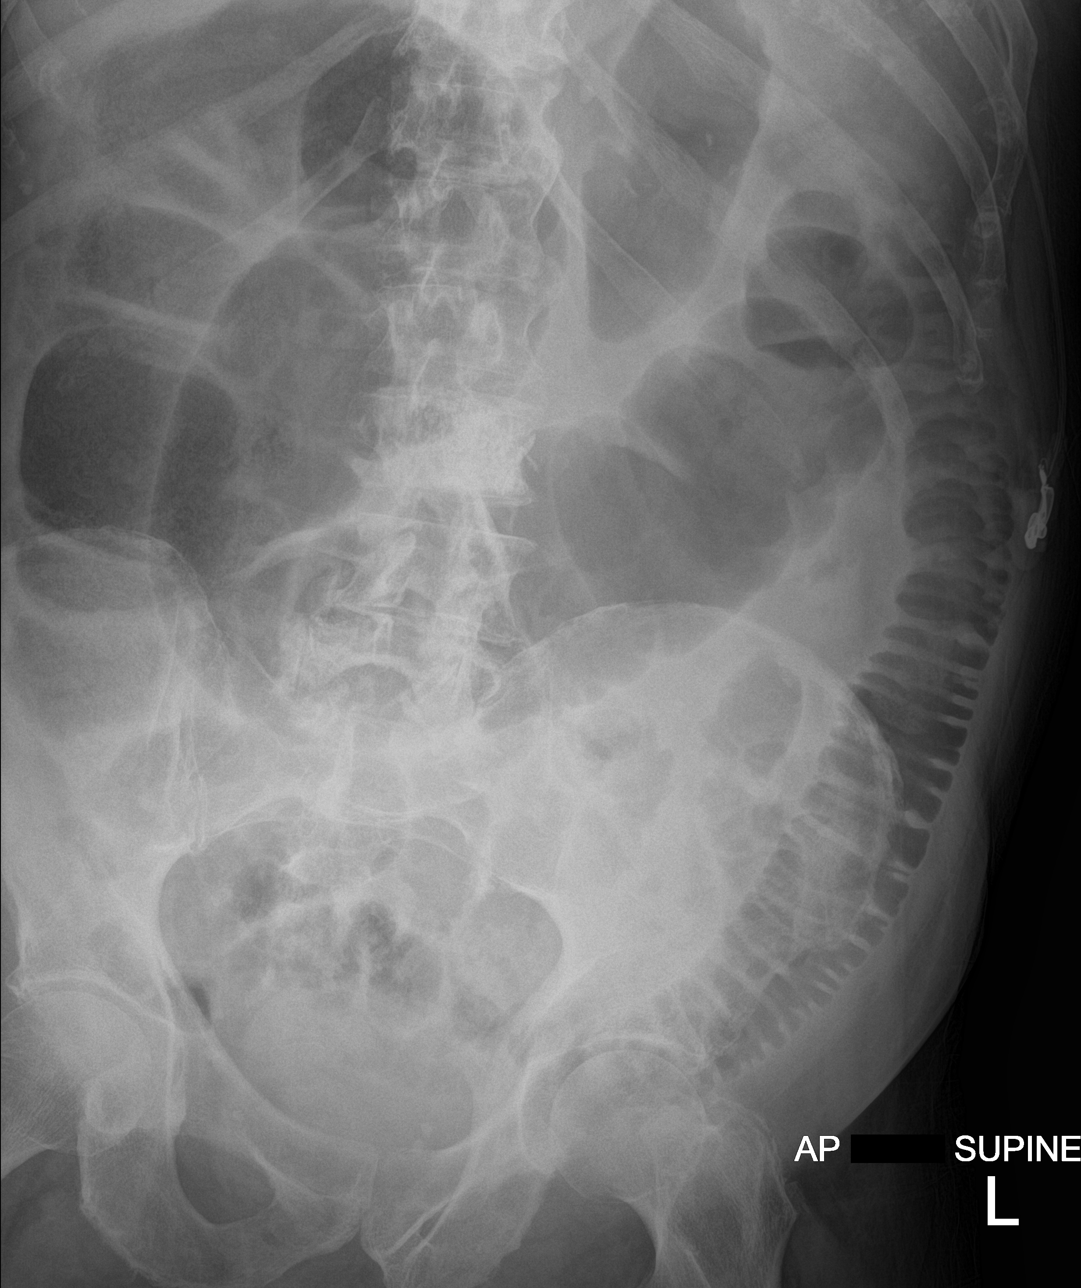
[im 4/4]
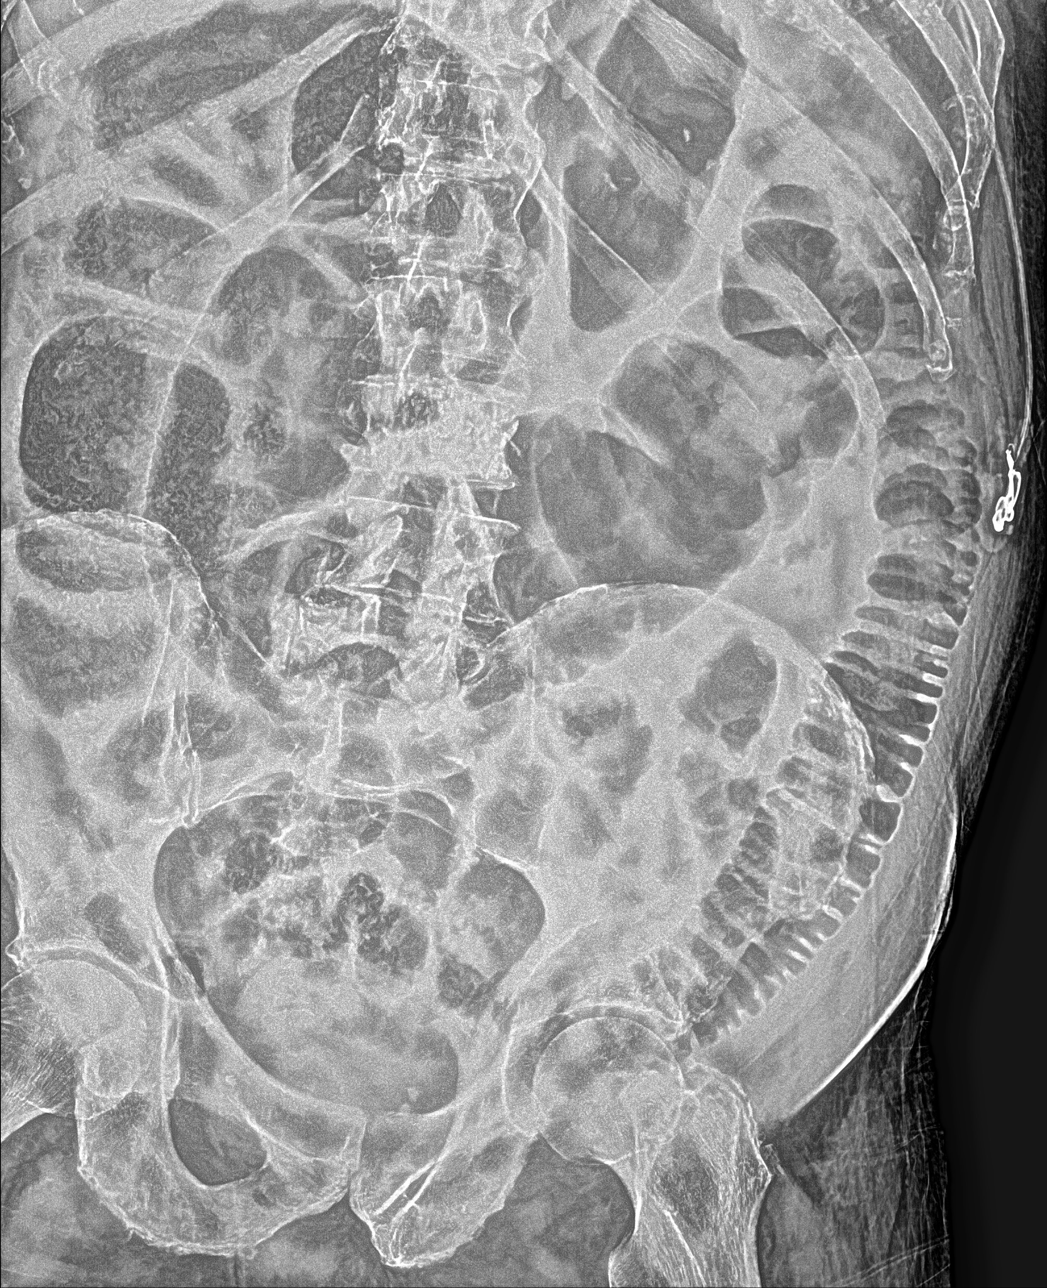

[4 of 4 positions shown; findings below may reference images not displayed]

FINDINGS: Several loops of slightly prominent small bowel noted. Distention of
the right and transverse colon up to 8 cm noted although adynamic
ileus could present this fashion. Follow-up exam suggested
demonstrate resolution to exclude bowel obstruction. No free air
identified. Degenerative change lumbar spine. Pelvic calcifications
consistent phleboliths.
IMPRESSION: Several loops of slightly prominent small bowel noted. Distention of
the right and transverse colon up to 8 cm noted. Although adynamic
ileus could present in this fashion. Follow-up exam suggested to
demonstrate resolution to exclude bowel obstruction.

## 2022-09-02 IMAGING — DX DG CHEST 1V PORT
1 series · 1 of 1 positions shown · non-contrast
Comparison: January 29, 2020

CLINICAL DATA: Respiratory distress

EXAM:
PORTABLE CHEST 1 VIEW

[chest ap]
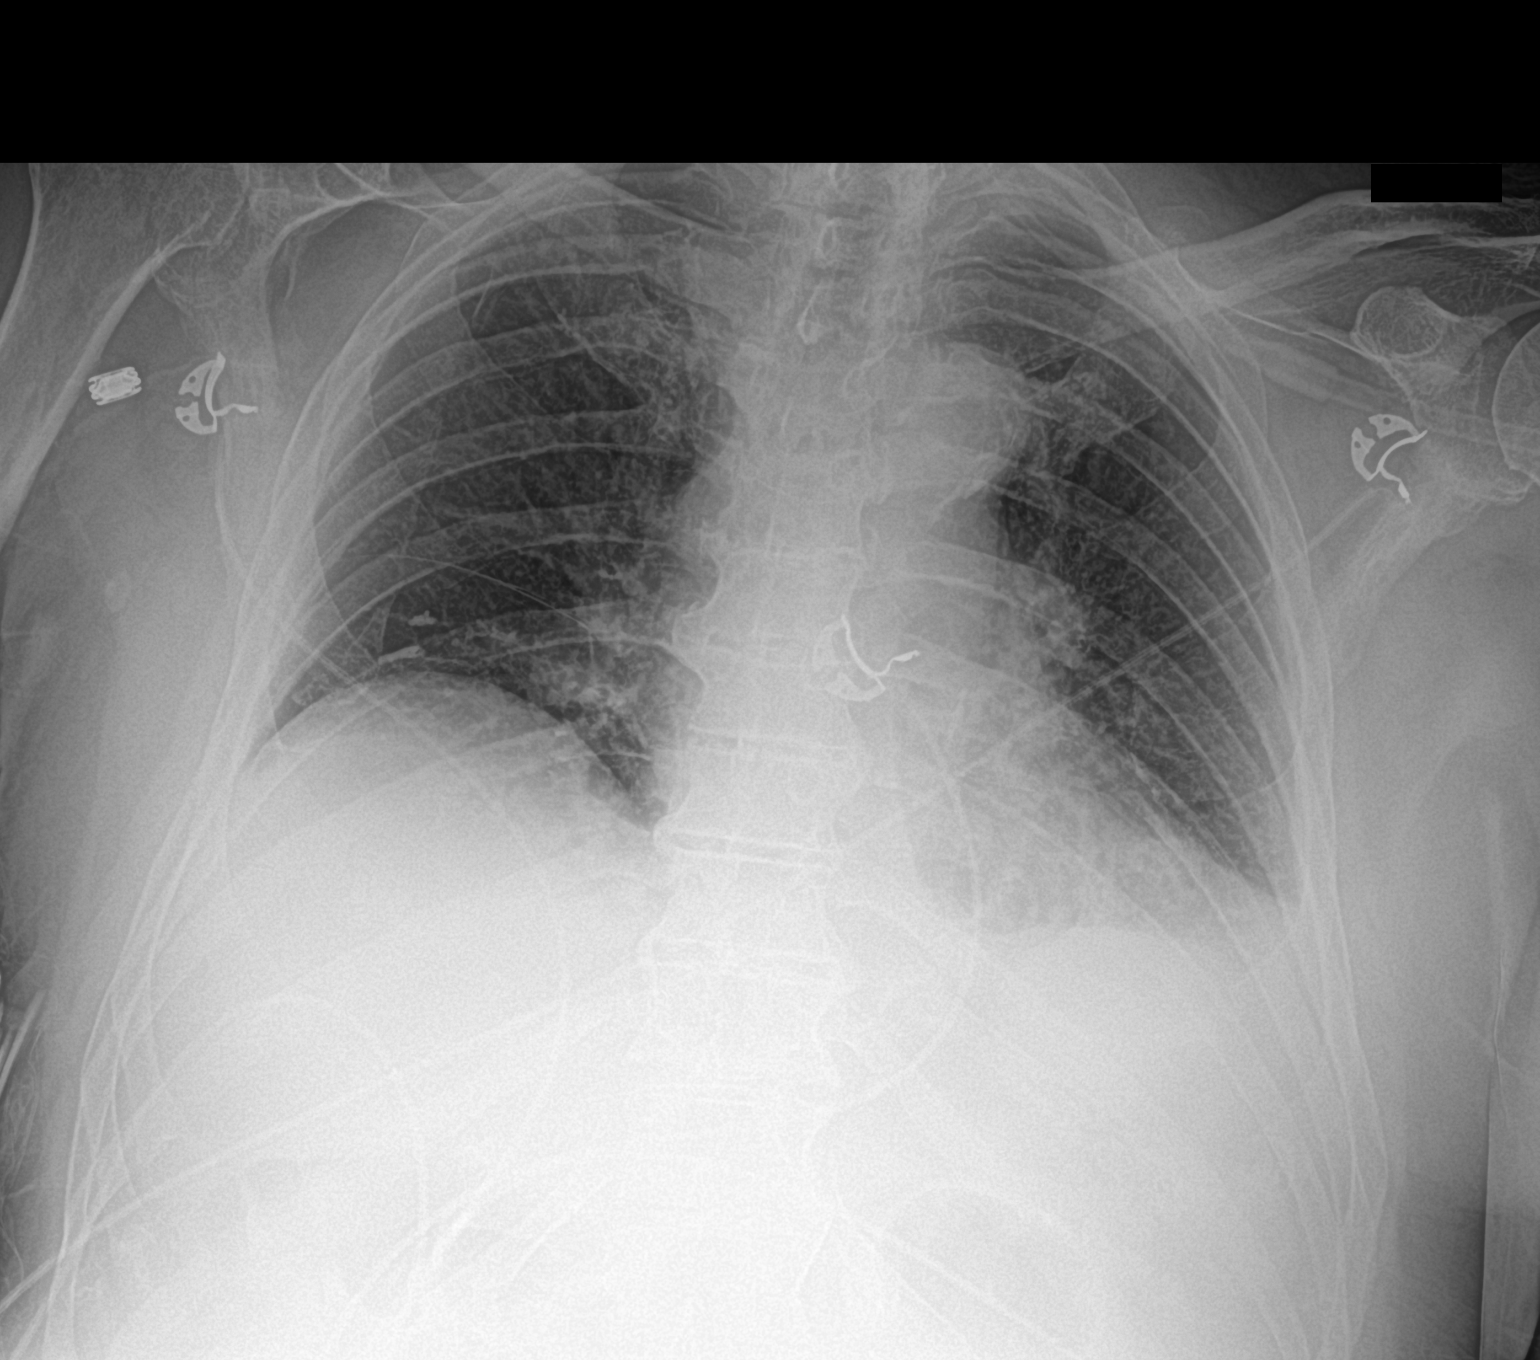

[1 of 1 positions shown; findings below may reference images not displayed]

FINDINGS: Lungs are clear. Heart size and pulmonary vascularity are within
normal limits. No adenopathy. There is aortic atherosclerosis. No
bone lesions.
IMPRESSION: No edema or airspace opacity. Heart size within normal limits.
Aortic Atherosclerosis ([id number]).
